# Patient Record
Sex: Male | Born: 1940 | Race: White | Hispanic: No | Marital: Married | Smoking: Former smoker
Health system: Southern US, Community
[De-identification: ages and names within clinical notes are randomized; demographics above are authoritative.]

## PROBLEM LIST (undated history)

## (undated) DIAGNOSIS — I10 Essential (primary) hypertension: Secondary | ICD-10-CM

## (undated) DIAGNOSIS — J449 Chronic obstructive pulmonary disease, unspecified: Secondary | ICD-10-CM

---

## 2018-12-09 ENCOUNTER — Inpatient Hospital Stay (HOSPITAL_COMMUNITY)
Admission: AD | Admit: 2018-12-09 | Discharge: 2019-01-06 | DRG: 163 | Disposition: E | Payer: Medicare Other | Source: Other Acute Inpatient Hospital | Attending: Pulmonary Disease | Admitting: Pulmonary Disease

## 2018-12-09 ENCOUNTER — Other Ambulatory Visit: Payer: Self-pay

## 2018-12-09 DIAGNOSIS — I952 Hypotension due to drugs: Secondary | ICD-10-CM | POA: Diagnosis not present

## 2018-12-09 DIAGNOSIS — A419 Sepsis, unspecified organism: Secondary | ICD-10-CM | POA: Diagnosis not present

## 2018-12-09 DIAGNOSIS — E872 Acidosis: Secondary | ICD-10-CM | POA: Diagnosis present

## 2018-12-09 DIAGNOSIS — E875 Hyperkalemia: Secondary | ICD-10-CM | POA: Diagnosis not present

## 2018-12-09 DIAGNOSIS — Z4659 Encounter for fitting and adjustment of other gastrointestinal appliance and device: Secondary | ICD-10-CM

## 2018-12-09 DIAGNOSIS — J9311 Primary spontaneous pneumothorax: Secondary | ICD-10-CM

## 2018-12-09 DIAGNOSIS — E870 Hyperosmolality and hypernatremia: Secondary | ICD-10-CM | POA: Diagnosis not present

## 2018-12-09 DIAGNOSIS — I471 Supraventricular tachycardia: Secondary | ICD-10-CM | POA: Diagnosis not present

## 2018-12-09 DIAGNOSIS — J15211 Pneumonia due to Methicillin susceptible Staphylococcus aureus: Secondary | ICD-10-CM | POA: Diagnosis not present

## 2018-12-09 DIAGNOSIS — J441 Chronic obstructive pulmonary disease with (acute) exacerbation: Secondary | ICD-10-CM

## 2018-12-09 DIAGNOSIS — Z8249 Family history of ischemic heart disease and other diseases of the circulatory system: Secondary | ICD-10-CM

## 2018-12-09 DIAGNOSIS — Z4682 Encounter for fitting and adjustment of non-vascular catheter: Secondary | ICD-10-CM

## 2018-12-09 DIAGNOSIS — I9589 Other hypotension: Secondary | ICD-10-CM | POA: Diagnosis not present

## 2018-12-09 DIAGNOSIS — J9622 Acute and chronic respiratory failure with hypercapnia: Secondary | ICD-10-CM | POA: Diagnosis not present

## 2018-12-09 DIAGNOSIS — E861 Hypovolemia: Secondary | ICD-10-CM | POA: Diagnosis not present

## 2018-12-09 DIAGNOSIS — Y848 Other medical procedures as the cause of abnormal reaction of the patient, or of later complication, without mention of misadventure at the time of the procedure: Secondary | ICD-10-CM | POA: Diagnosis not present

## 2018-12-09 DIAGNOSIS — Z9689 Presence of other specified functional implants: Secondary | ICD-10-CM

## 2018-12-09 DIAGNOSIS — J9601 Acute respiratory failure with hypoxia: Secondary | ICD-10-CM | POA: Diagnosis not present

## 2018-12-09 DIAGNOSIS — Z87891 Personal history of nicotine dependence: Secondary | ICD-10-CM

## 2018-12-09 DIAGNOSIS — I480 Paroxysmal atrial fibrillation: Secondary | ICD-10-CM | POA: Diagnosis present

## 2018-12-09 DIAGNOSIS — Z79899 Other long term (current) drug therapy: Secondary | ICD-10-CM | POA: Diagnosis not present

## 2018-12-09 DIAGNOSIS — J440 Chronic obstructive pulmonary disease with acute lower respiratory infection: Secondary | ICD-10-CM | POA: Diagnosis not present

## 2018-12-09 DIAGNOSIS — E876 Hypokalemia: Secondary | ICD-10-CM | POA: Diagnosis present

## 2018-12-09 DIAGNOSIS — R0602 Shortness of breath: Secondary | ICD-10-CM

## 2018-12-09 DIAGNOSIS — J189 Pneumonia, unspecified organism: Secondary | ICD-10-CM

## 2018-12-09 DIAGNOSIS — K5900 Constipation, unspecified: Secondary | ICD-10-CM | POA: Diagnosis not present

## 2018-12-09 DIAGNOSIS — J860 Pyothorax with fistula: Secondary | ICD-10-CM | POA: Diagnosis not present

## 2018-12-09 DIAGNOSIS — D72829 Elevated white blood cell count, unspecified: Secondary | ICD-10-CM | POA: Diagnosis not present

## 2018-12-09 DIAGNOSIS — D649 Anemia, unspecified: Secondary | ICD-10-CM | POA: Diagnosis present

## 2018-12-09 DIAGNOSIS — J9602 Acute respiratory failure with hypercapnia: Secondary | ICD-10-CM

## 2018-12-09 DIAGNOSIS — J9383 Other pneumothorax: Principal | ICD-10-CM | POA: Diagnosis present

## 2018-12-09 DIAGNOSIS — E440 Moderate protein-calorie malnutrition: Secondary | ICD-10-CM | POA: Diagnosis present

## 2018-12-09 DIAGNOSIS — T4275XA Adverse effect of unspecified antiepileptic and sedative-hypnotic drugs, initial encounter: Secondary | ICD-10-CM | POA: Diagnosis not present

## 2018-12-09 DIAGNOSIS — J939 Pneumothorax, unspecified: Secondary | ICD-10-CM | POA: Diagnosis not present

## 2018-12-09 DIAGNOSIS — Y95 Nosocomial condition: Secondary | ICD-10-CM | POA: Diagnosis not present

## 2018-12-09 DIAGNOSIS — N179 Acute kidney failure, unspecified: Secondary | ICD-10-CM | POA: Diagnosis not present

## 2018-12-09 DIAGNOSIS — S2249XA Multiple fractures of ribs, unspecified side, initial encounter for closed fracture: Secondary | ICD-10-CM | POA: Diagnosis not present

## 2018-12-09 DIAGNOSIS — I483 Typical atrial flutter: Secondary | ICD-10-CM | POA: Diagnosis not present

## 2018-12-09 DIAGNOSIS — R000 Tachycardia, unspecified: Secondary | ICD-10-CM | POA: Diagnosis not present

## 2018-12-09 DIAGNOSIS — R6521 Severe sepsis with septic shock: Secondary | ICD-10-CM | POA: Diagnosis not present

## 2018-12-09 DIAGNOSIS — I4891 Unspecified atrial fibrillation: Secondary | ICD-10-CM | POA: Diagnosis not present

## 2018-12-09 DIAGNOSIS — G931 Anoxic brain damage, not elsewhere classified: Secondary | ICD-10-CM | POA: Diagnosis not present

## 2018-12-09 DIAGNOSIS — J9382 Other air leak: Secondary | ICD-10-CM | POA: Diagnosis present

## 2018-12-09 DIAGNOSIS — D72828 Other elevated white blood cell count: Secondary | ICD-10-CM | POA: Diagnosis present

## 2018-12-09 DIAGNOSIS — L8990 Pressure ulcer of unspecified site, unspecified stage: Secondary | ICD-10-CM

## 2018-12-09 DIAGNOSIS — Z452 Encounter for adjustment and management of vascular access device: Secondary | ICD-10-CM

## 2018-12-09 DIAGNOSIS — R739 Hyperglycemia, unspecified: Secondary | ICD-10-CM | POA: Diagnosis not present

## 2018-12-09 DIAGNOSIS — J449 Chronic obstructive pulmonary disease, unspecified: Secondary | ICD-10-CM | POA: Diagnosis not present

## 2018-12-09 DIAGNOSIS — Z978 Presence of other specified devices: Secondary | ICD-10-CM

## 2018-12-09 DIAGNOSIS — T797XXA Traumatic subcutaneous emphysema, initial encounter: Secondary | ICD-10-CM | POA: Diagnosis present

## 2018-12-09 DIAGNOSIS — Z9289 Personal history of other medical treatment: Secondary | ICD-10-CM

## 2018-12-09 DIAGNOSIS — E8770 Fluid overload, unspecified: Secondary | ICD-10-CM | POA: Diagnosis not present

## 2018-12-09 DIAGNOSIS — J9621 Acute and chronic respiratory failure with hypoxia: Secondary | ICD-10-CM | POA: Diagnosis not present

## 2018-12-09 DIAGNOSIS — Z419 Encounter for procedure for purposes other than remedying health state, unspecified: Secondary | ICD-10-CM

## 2018-12-09 DIAGNOSIS — I10 Essential (primary) hypertension: Secondary | ICD-10-CM | POA: Diagnosis not present

## 2018-12-09 DIAGNOSIS — B952 Enterococcus as the cause of diseases classified elsewhere: Secondary | ICD-10-CM | POA: Diagnosis not present

## 2018-12-09 DIAGNOSIS — Z539 Procedure and treatment not carried out, unspecified reason: Secondary | ICD-10-CM | POA: Diagnosis not present

## 2018-12-09 DIAGNOSIS — J9690 Respiratory failure, unspecified, unspecified whether with hypoxia or hypercapnia: Secondary | ICD-10-CM

## 2018-12-09 DIAGNOSIS — B9561 Methicillin susceptible Staphylococcus aureus infection as the cause of diseases classified elsewhere: Secondary | ICD-10-CM | POA: Diagnosis present

## 2018-12-09 DIAGNOSIS — R0600 Dyspnea, unspecified: Secondary | ICD-10-CM | POA: Diagnosis not present

## 2018-12-09 DIAGNOSIS — I469 Cardiac arrest, cause unspecified: Secondary | ICD-10-CM | POA: Diagnosis not present

## 2018-12-09 DIAGNOSIS — J439 Emphysema, unspecified: Secondary | ICD-10-CM | POA: Diagnosis not present

## 2018-12-09 DIAGNOSIS — Z66 Do not resuscitate: Secondary | ICD-10-CM | POA: Diagnosis present

## 2018-12-09 DIAGNOSIS — Z01818 Encounter for other preprocedural examination: Secondary | ICD-10-CM

## 2018-12-09 DIAGNOSIS — Z515 Encounter for palliative care: Secondary | ICD-10-CM | POA: Diagnosis present

## 2018-12-09 HISTORY — DX: Chronic obstructive pulmonary disease, unspecified: J44.9

## 2018-12-09 HISTORY — DX: Essential (primary) hypertension: I10

## 2018-12-09 MED ORDER — ORAL CARE MOUTH RINSE
15.0000 mL | Freq: Two times a day (BID) | OROMUCOSAL | Status: DC
  Administered 2018-12-10 – 2018-12-18 (×12): 15 mL via OROMUCOSAL

## 2018-12-10 ENCOUNTER — Inpatient Hospital Stay (HOSPITAL_COMMUNITY): Payer: Medicare Other

## 2018-12-10 ENCOUNTER — Other Ambulatory Visit: Payer: Self-pay

## 2018-12-10 ENCOUNTER — Encounter (HOSPITAL_COMMUNITY): Payer: Self-pay | Admitting: Internal Medicine

## 2018-12-10 DIAGNOSIS — J9383 Other pneumothorax: Secondary | ICD-10-CM | POA: Diagnosis present

## 2018-12-10 DIAGNOSIS — J939 Pneumothorax, unspecified: Secondary | ICD-10-CM

## 2018-12-10 DIAGNOSIS — I10 Essential (primary) hypertension: Secondary | ICD-10-CM | POA: Diagnosis present

## 2018-12-10 DIAGNOSIS — J439 Emphysema, unspecified: Secondary | ICD-10-CM

## 2018-12-10 DIAGNOSIS — J441 Chronic obstructive pulmonary disease with (acute) exacerbation: Secondary | ICD-10-CM

## 2018-12-10 LAB — COMPREHENSIVE METABOLIC PANEL
ALBUMIN: 3.4 g/dL — AB (ref 3.5–5.0)
ALT: 29 U/L (ref 0–44)
AST: 18 U/L (ref 15–41)
Alkaline Phosphatase: 47 U/L (ref 38–126)
Anion gap: 9 (ref 5–15)
BILIRUBIN TOTAL: 0.6 mg/dL (ref 0.3–1.2)
BUN: 31 mg/dL — ABNORMAL HIGH (ref 8–23)
CO2: 31 mmol/L (ref 22–32)
Calcium: 8.7 mg/dL — ABNORMAL LOW (ref 8.9–10.3)
Chloride: 105 mmol/L (ref 98–111)
Creatinine, Ser: 1.18 mg/dL (ref 0.61–1.24)
GFR calc Af Amer: >60 mL/min (ref >60)
GFR calc non Af Amer: 60 mL/min — ABNORMAL LOW (ref >60)
Glucose, Bld: 98 mg/dL (ref 70–99)
Potassium: 3.4 mmol/L — ABNORMAL LOW (ref 3.5–5.1)
Sodium: 145 mmol/L (ref 135–145)
Total Protein: 6.1 g/dL — ABNORMAL LOW (ref 6.5–8.1)

## 2018-12-10 LAB — CBC
HCT: 38.3 % — ABNORMAL LOW (ref 39.0–52.0)
Hemoglobin: 11.8 g/dL — ABNORMAL LOW (ref 13.0–17.0)
MCH: 31.1 pg (ref 26.0–34.0)
MCHC: 30.8 g/dL (ref 30.0–36.0)
MCV: 100.8 fL — ABNORMAL HIGH (ref 80.0–100.0)
Platelets: 352 10*3/uL (ref 150–400)
RBC: 3.8 MIL/uL — ABNORMAL LOW (ref 4.22–5.81)
RDW: 11.9 % (ref 11.5–15.5)
WBC: 13.5 10*3/uL — AB (ref 4.0–10.5)
nRBC: 0 % (ref 0.0–0.2)

## 2018-12-10 LAB — MRSA PCR SCREENING: MRSA by PCR: NEGATIVE

## 2018-12-10 MED ORDER — ACETAMINOPHEN 325 MG PO TABS
650.00 | ORAL_TABLET | ORAL | Status: DC
Start: ? — End: 2018-12-10

## 2018-12-10 MED ORDER — SODIUM CHLORIDE 0.9 % IV SOLN
INTRAVENOUS | Status: DC
  Administered 2018-12-10 (×3): via INTRAVENOUS

## 2018-12-10 MED ORDER — ONDANSETRON HCL 4 MG/2ML IJ SOLN: 4.0000 mg | Freq: Four times a day (QID) | INTRAMUSCULAR | Status: DC | PRN

## 2018-12-10 MED ORDER — ONDANSETRON HCL 4 MG PO TABS: 4.0000 mg | ORAL_TABLET | Freq: Four times a day (QID) | ORAL | Status: DC | PRN

## 2018-12-10 MED ORDER — POTASSIUM CHLORIDE CRYS ER 20 MEQ PO TBCR
40.0000 meq | EXTENDED_RELEASE_TABLET | Freq: Once | ORAL | Status: AC
  Administered 2018-12-10: 40 meq via ORAL
  Filled 2018-12-10: qty 2

## 2018-12-10 MED ORDER — ALBUTEROL SULFATE (2.5 MG/3ML) 0.083% IN NEBU: 2.5000 mg | INHALATION_SOLUTION | Freq: Four times a day (QID) | RESPIRATORY_TRACT | Status: DC | PRN

## 2018-12-10 MED ORDER — OXYCODONE-ACETAMINOPHEN 5-325 MG PO TABS
1.00 | ORAL_TABLET | ORAL | Status: DC
Start: ? — End: 2018-12-10

## 2018-12-10 MED ORDER — ALBUTEROL SULFATE (2.5 MG/3ML) 0.083% IN NEBU
2.50 | INHALATION_SOLUTION | RESPIRATORY_TRACT | Status: DC
Start: ? — End: 2018-12-10

## 2018-12-10 MED ORDER — ASPIRIN EC 81 MG PO TBEC
81.00 | DELAYED_RELEASE_TABLET | ORAL | Status: DC
Start: 2018-12-10 — End: 2018-12-10

## 2018-12-10 MED ORDER — ACETAMINOPHEN 325 MG PO TABS
650.0000 mg | ORAL_TABLET | Freq: Four times a day (QID) | ORAL | Status: DC | PRN
  Administered 2018-12-10 – 2018-12-18 (×9): 650 mg via ORAL
  Filled 2018-12-10 (×9): qty 2

## 2018-12-10 MED ORDER — ONDANSETRON HCL 4 MG/2ML IJ SOLN
4.00 | INTRAMUSCULAR | Status: DC
Start: ? — End: 2018-12-10

## 2018-12-10 MED ORDER — MORPHINE SULFATE (PF) 2 MG/ML IV SOLN
2.0000 mg | INTRAVENOUS | Status: DC | PRN
  Filled 2018-12-10 (×2): qty 1

## 2018-12-10 MED ORDER — FLUTICASONE FUROATE-VILANTEROL 100-25 MCG/INH IN AEPB
1.00 | INHALATION_SPRAY | RESPIRATORY_TRACT | Status: DC
Start: 2018-12-10 — End: 2018-12-10

## 2018-12-10 MED ORDER — LOSARTAN POTASSIUM 50 MG PO TABS
100.00 | ORAL_TABLET | ORAL | Status: DC
Start: 2018-12-10 — End: 2018-12-10

## 2018-12-10 MED ORDER — OXYCODONE HCL 5 MG PO TABS: 5.0000 mg | ORAL_TABLET | Freq: Four times a day (QID) | ORAL | Status: DC | PRN

## 2018-12-10 MED ORDER — ENOXAPARIN SODIUM 40 MG/0.4ML ~~LOC~~ SOLN
40.00 | SUBCUTANEOUS | Status: DC
Start: 2018-12-10 — End: 2018-12-10

## 2018-12-10 MED ORDER — FLUTICASONE FUROATE-VILANTEROL 100-25 MCG/INH IN AEPB
1.0000 | INHALATION_SPRAY | Freq: Every day | RESPIRATORY_TRACT | Status: DC
  Administered 2018-12-10 – 2018-12-17 (×8): 1 via RESPIRATORY_TRACT
  Filled 2018-12-10 (×2): qty 28

## 2018-12-10 MED ORDER — HYDROCHLOROTHIAZIDE 25 MG PO TABS
25.00 | ORAL_TABLET | ORAL | Status: DC
Start: 2018-12-10 — End: 2018-12-10

## 2018-12-10 MED ORDER — MONTELUKAST SODIUM 10 MG PO TABS
10.00 | ORAL_TABLET | ORAL | Status: DC
Start: 2018-12-10 — End: 2018-12-10

## 2018-12-10 MED ORDER — MONTELUKAST SODIUM 10 MG PO TABS
10.0000 mg | ORAL_TABLET | Freq: Every day | ORAL | Status: DC
  Administered 2018-12-10 – 2018-12-12 (×3): 10 mg via ORAL
  Filled 2018-12-10 (×3): qty 1

## 2018-12-10 MED ORDER — TRAMADOL HCL 50 MG PO TABS
50.0000 mg | ORAL_TABLET | Freq: Four times a day (QID) | ORAL | Status: DC | PRN
  Administered 2018-12-11 – 2018-12-18 (×3): 50 mg via ORAL
  Filled 2018-12-10 (×3): qty 1

## 2018-12-10 MED ORDER — MONTELUKAST SODIUM 10 MG PO TABS: 10.0000 mg | ORAL_TABLET | Freq: Every day | ORAL | Status: DC

## 2018-12-10 NOTE — Progress Notes (Signed)
I have seen and assessed patient and agree with Dr. Boston Service assessment and plan.  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  Initially small bore thoracostomy tube was placed however course was complicated by subcutaneous emphysema and persistent air leak.  Large bore thoracostomy tube was placed with attempted chemical pleurodesis on 12/07/2018 which was complicated by significant pain, hypoxemia respiratory distress requiring transfer to ICU and required O2 delivery by OptiFlow to maintain sats.  Follow-up imaging showed persistent left pneumothorax with increasing respiratory distress.  Small bore thoracostomy tube was subsequently placed under CT guidance on 12/08/2018 with significant clinical improvement.  Patient weaned down to 2 L nasal cannula and transferred to [facility] for further evaluation by CT surgery.  Patient currently stable.  Consultation by CT surgery pending.  No charge.

## 2018-12-10 NOTE — H&P (Signed)
History and Physical    Matthew Cuevas GQB:[id number] DOB: September 23, 1941 DOA: 12/21/2018  PCP: No primary care provider on file.  Patient coming from: [facility]  I have personally briefly reviewed patient's old medical records in [facility] Link  Chief Complaint: Persistent L pneumothorax  HPI: Matthew Cuevas is a 78 y.o. male with medical history significant of COPD and HTN.  Patient was admitted to [facility] on 11/29/2018 for spontaneous L PTX secondary to emphysematous bleb rupture.  Initially controlled with small bore thoracostomy tube placement. Course complicated by subcutaneous emphysema and persistent airleak. Large bore thoracostomy tube placed with attempted chemical pleurodesis on 12/07/2018. Procedure complicated by significant pain, hypoxemia and respiratory distress.  Patient transferred to ICU service requiring O2 delivery by Optiflow to maintain saturations. Follow up imaging showing persistent left pneumothorax with increasing respiratory distress. Small bore thoracostomy tube placed under CT guidance on 12/08/2018 with significant improvement in symptoms. Patient was weaned down to 2L [place] and family has requested transfer to [facility] for continued management.  He arrives to [facility] in stable condition, satting 100% on 2L, RR 20, BP 142/42, thankfully not requiring pressors (he had been requiring levophed earlier in the day prior to transfer apparently).  He and wife report improvement in the swelling around his face (subcutaneous emphysema).  He reports he isnt in any respiratory distress right now, though he will get short of breath if he tries to stand up and walk to the bathroom he says.   Review of Systems: As per HPI otherwise 10 point review of systems negative.   Past Medical History:  Diagnosis Date  . COPD (chronic obstructive pulmonary disease) (HCC)   . HTN (hypertension)     History reviewed. No pertinent surgical history.   reports that he has quit smoking. He  does not have any smokeless tobacco history on file. He reports that he does not drink alcohol or use drugs.  Allergies  Allergen Reactions  . Doxycycline     Caused respiratory distress when they tried to use it in pleural space for pneumothorax  . Penicillins     History reviewed. No pertinent family history.   Prior to Admission medications   Not on File    Physical Exam: Vitals:   12/24/2018 2300 12/23/2018 2313  BP:  (!) 142/42  Resp:  (!) 27  Temp: 98.3 F (36.8 C)   TempSrc: Oral   SpO2:  98%  Height: 5\' 10"  (1.778 m)     Constitutional: NAD, calm, comfortable Eyes: PERRL, lids and conjunctivae normal ENMT: Mucous membranes are moist. Posterior pharynx clear of any exudate or lesions.Normal dentition.  Neck: normal, supple, no masses, no thyromegaly Respiratory: clear to auscultation bilaterally, no wheezing, no crackles. Normal respiratory effort. No accessory muscle use.  Cardiovascular: Regular rate and rhythm, no murmurs / rubs / gallops. No extremity edema. 2+ pedal pulses. No carotid bruits.  Abdomen: no tenderness, no masses palpated. No hepatosplenomegaly. Bowel sounds positive.  Musculoskeletal: no clubbing / cyanosis. No joint deformity upper and lower extremities. Good ROM, no contractures. Normal muscle tone.  Skin: SubQ emphysema up to face Neurologic: CN 2-12 grossly intact. Sensation intact, DTR normal. Strength 5/5 in all 4.  Psychiatric: Normal judgment and insight. Alert and oriented x 3. Normal mood.    Labs on Admission: I have personally reviewed following labs and imaging studies  CBC: No results for input(s): WBC, NEUTROABS, HGB, HCT, MCV, PLT in the last 168 hours. Basic Metabolic Panel: No  results for input(s): NA, K, CL, CO2, GLUCOSE, BUN, CREATININE, CALCIUM, MG, PHOS in the last 168 hours. GFR: CrCl cannot be calculated (No successful lab value found.). Liver Function Tests: No results for input(s): AST, ALT, ALKPHOS, BILITOT, PROT,  ALBUMIN in the last 168 hours. No results for input(s): LIPASE, AMYLASE in the last 168 hours. No results for input(s): AMMONIA in the last 168 hours. Coagulation Profile: No results for input(s): INR, PROTIME in the last 168 hours. Cardiac Enzymes: No results for input(s): CKTOTAL, CKMB, CKMBINDEX, TROPONINI in the last 168 hours. BNP (last 3 results) No results for input(s): PROBNP in the last 8760 hours. HbA1C: No results for input(s): HGBA1C in the last 72 hours. CBG: No results for input(s): GLUCAP in the last 168 hours. Lipid Profile: No results for input(s): CHOL, HDL, LDLCALC, TRIG, CHOLHDL, LDLDIRECT in the last 72 hours. Thyroid Function Tests: No results for input(s): TSH, T4TOTAL, FREET4, T3FREE, THYROIDAB in the last 72 hours. Anemia Panel: No results for input(s): VITAMINB12, FOLATE, FERRITIN, TIBC, IRON, RETICCTPCT in the last 72 hours. Urine analysis: No results found for: COLORURINE, APPEARANCEUR, LABSPEC, PHURINE, GLUCOSEU, HGBUR, BILIRUBINUR, KETONESUR, PROTEINUR, UROBILINOGEN, NITRITE, LEUKOCYTESUR  Radiological Exams on Admission: No results found.  EKG: Independently reviewed.  Assessment/Plan Principal Problem:   Spontaneous pneumothorax Active Problems:   COPD (chronic obstructive pulmonary disease) (HCC)   HTN (hypertension)    1. Spontaneous L PTX - persistent air leak and PTX on X rays at OSH 1. Dr. Dorris Fetch will see patient in AM 2. Will keep patient NPO for now.  Ask Dr. Dorris Fetch tomorrow around breakfast time if he has any surgery planned 1. IVF NS at 100 cc/hr 3. Will get routine CBC/CMP 4. Will get port CXR 5. 2 chest tubes in place, one pulling air still. 6. Currently stable 7. Tele monitor 8. Cont pulse ox 9. Patient requested tylenol for pain control, says he didn't want anything stronger (I did offer morphine). 2. COPD - 1. Cont breo elipita daily 2. Cont singulair 3. Cont albuterol PRN 3. HTN - 1. Holding home BP meds as he  had just been on levophed earlier today.  DVT prophylaxis: SCDs - in case he needs surgery Code Status: Full Family Communication: Wife at bedside Disposition Plan: Home after admit Consults called: Dr. Dorris Fetch will see patient in AM Admission status: Admit to inpatient  Severity of Illness: The appropriate patient status for this patient is INPATIENT. Inpatient status is judged to be reasonable and necessary in order to provide the required intensity of service to ensure the patient's safety. [name] presenting symptoms, physical exam findings, and initial radiographic and laboratory data in the context of their chronic comorbidities is felt to place them at high risk for further clinical deterioration. Furthermore, it is not anticipated that the patient will be medically stable for discharge from the hospital within 2 midnights of admission. The following factors support the patient status of inpatient.   Patient transferred from [facility] with persistent pneumothorax on X rays and air leak despite 2 chest tubes and a week of treatment there as described above.   * I certify that at the point of admission it is my clinical judgment that the patient will require inpatient hospital care spanning beyond 2 midnights from the point of admission due to high intensity of service, high risk for further deterioration and high frequency of surveillance required.*    ,  M. DO [facility]  How to contact the [facility] Attending or Consulting  provider 7A - 7P or covering provider during after hours 7P -7A, for this patient?  1. Check the care team in [facility] and look for a) attending/consulting [facility] provider listed and b) the [facility] team listed 2. Log into [email or web address]  Amion Physician Scheduling and messaging for groups and whole hospitals  On call and physician scheduling software for group practices, residents, hospitalists and other medical providers for call, clinic, rotation and shift  schedules. OnCall [employer] is a hospital-wide system for scheduling doctors and paging doctors on call. EasyPlot is for scientific plotting and data analysis.  [email or web address]  and use [place]'s universal password to access. If you do not have the password, please contact the hospital operator.  3. Locate the [facility] provider you are looking for under [facility] and page to a number that you can be directly reached. 4. If you still have difficulty reaching the provider, please page the [facility] (Director on Call) for the Hospitalists listed on amion for assistance.  12/10/2018, 12:53 AM

## 2018-12-10 NOTE — Consult Note (Addendum)
[address]       [place] [address]             [phone]        DRAYCEN WEISHAUPT Rolla Medical Record #[id number] Date of Birth: 04-21-1941  Referring: No ref. provider found Primary Care: No primary care provider on file. Primary Cardiologist:No primary care provider on file.  Chief Complaint: shortness of breath  History of Present Illness:      Mr. Arne Cleveland is a 78 year old male patient with a past medical history significant for COPD, previous tobacco abuse (quit 4 years ago) and hypertension, who originally was admitted on 11/29/2018 for a left spontaneous pneumothorax secondary to an emphysematous bleb rupture.  A small bore ( 16 french) chest tube was placed emergently. He then had severe subcutaneous emphysema and a persistent air leak.  A large bore chest tube was placed on 12/07/2018 and a chemical pleurodesis was attempted through the tube.  The patient then had significant pain, hypoxemia, and respiratory distress.  The patient was transferred to the ICU and initially was requiring high flow [place] to maintain saturation levels. He was not intubated at this time.  Follow-up chest x-ray showed persistent left pneumothorax, therefore small bore chest tube was placed under CT guidance on 12/08/2018 with significant improvement in symptoms.  Once weaned to 2 L nasal cannula oxygen support his family requested transfer to [facility] for continued care.    On arrival to [facility] he remained stable and did not require any IV pressors.  A chest x-ray was obtained on 12/10/2018 which showed a 5% left apical pneumothorax with 2 left chest tubes in place.  There is subcutaneous emphysema in the bilateral supraclavicular regions and over the left chest wall.  The patient is currently tolerating 4 L nasal cannula with good oxygen saturation.  He endorses shortness of breath when walking short distances. The patient is very weak and has not done much walking on his  own.There is an air leak present in the pigtail catheter atrium but not the large bore chest tube atrium. We are consulted for possible surgical intervention.   Current Activity/ Functional Status: Patient was independent with mobility/ambulation, transfers, ADL's, IADL's.   Zubrod Score: At the time of surgery this patient's most appropriate activity status/level should be described as: []     0    Normal activity, no symptoms [x]     1    Restricted in physical strenuous activity but ambulatory, able to do out light work []     2    Ambulatory and capable of self care, unable to do work activities, up and about                 more than 50%  Of the time                            []     3    Only limited self care, in bed greater than 50% of waking hours []     4    Completely disabled, no self care, confined to bed or chair []     5    Moribund  Past Medical History:  Diagnosis Date  . COPD (chronic obstructive pulmonary disease) (HCC)   . HTN (hypertension)     History reviewed. No pertinent surgical history.  Social History   Tobacco Use  Smoking Status Former Smoker  Social History   Substance and Sexual Activity  Alcohol Use Never  . Frequency: Never     Allergies  Allergen Reactions  . Doxycycline Shortness Of Breath and Other (See Comments)    Caused respiratory distress when they tried to use it in pleural space for pneumothorax  . Penicillins     Did it involve swelling of the face/tongue/throat, SOB, or low BP? Unknown Did it involve sudden or severe rash/hives, skin peeling, or any reaction on the inside of your mouth or nose? Unknown Did you need to seek medical attention at a hospital or doctor's office? Unknown When did it last happen? [place] If all above answers are "NO", may proceed with cephalosporin use.     Current Facility-Administered Medications  Medication Dose Route Frequency Provider Last Rate Last Dose  . 0.9 %  sodium chloride  infusion   Intravenous Continuous Hillary Bow, DO 100 mL/hr at 12/10/18 0058    . acetaminophen (TYLENOL) tablet 650 mg  650 mg Oral Q6H PRN Hillary Bow, DO   650 mg at 12/10/18 0051  . albuterol (PROVENTIL) (2.5 MG/3ML) 0.083% nebulizer solution 2.5 mg  2.5 mg Nebulization Q6H PRN Hillary Bow, DO      . fluticasone furoate-vilanterol (BREO ELLIPTA) 100-25 MCG/INH 1 puff  1 puff Inhalation Daily Lyda Perone M, DO   1 puff at 12/10/18 0756  . MEDLINE mouth rinse  15 mL Mouth Rinse BID Dorcas Carrow, MD   15 mL at 12/10/18 0904  . [START ON 12/11/2018] montelukast (SINGULAIR) tablet 10 mg  10 mg Oral QHS Hillary Bow, DO      . morphine 2 MG/ML injection 2-4 mg  2-4 mg Intravenous Q4H PRN Hillary Bow, DO      . ondansetron [facility]) tablet 4 mg  4 mg Oral Q6H PRN Hillary Bow, DO       Or  . ondansetron [facility]) injection 4 mg  4 mg Intravenous Q6H PRN Hillary Bow, DO      . traMADol Janean Sark) tablet 50 mg  50 mg Oral Q6H PRN Rodolph Bong, MD        Medications Prior to Admission  Medication Sig Dispense Refill Last Dose  . BREO ELLIPTA 100-25 MCG/INH AEPB Inhale 1 puff into the lungs 2 (two) times daily.     . hydrochlorothiazide (HYDRODIURIL) 25 MG tablet Take 25 mg by mouth daily.     Marland Kitchen losartan (COZAAR) 100 MG tablet Take 100 mg by mouth daily.     . montelukast (SINGULAIR) 10 MG tablet Take 10 mg by mouth at bedtime.       History reviewed. No pertinent family history.   Review of Systems:   Review of Systems  Respiratory: Positive for shortness of breath.   Cardiovascular: Negative for chest pain and leg swelling.  Gastrointestinal: Negative for heartburn, nausea and vomiting.   Pertinent items are noted in HPI.     Physical Exam: BP (!) 117/48 (BP Location: Left Arm)   Pulse 76   Temp 97.8 F (36.6 C) (Oral)   Resp (!) 23   Ht 5\' 10"  (1.778 m)   Wt 63.5 kg   SpO2 99%   BMI 20.09 kg/m    General appearance: alert, cooperative  and no distress Resp: clear to auscultation bilaterally Cardio: regular rate and rhythm, S1, S2 normal, no murmur, click, rub or gallop GI: soft, non-tender; bowel sounds normal; no masses,  no organomegaly Extremities: extremities normal,  atraumatic, no cyanosis or edema Neurologic: Grossly normal  Diagnostic Studies & Laboratory data:     Recent Radiology Findings:  CLINICAL DATA:  Pneumothorax  EXAM: PORTABLE CHEST 1 VIEW  COMPARISON:  None.  FINDINGS: Pigtail chest tube projects over the left lung base. Surgical chest tube projects over the left upper lung zone. 5% left apical pneumothorax persists. Subcutaneous emphysema is in the bilateral supraclavicular regions and over the left chest wall. Right lung is clear. Scattered volume loss in the left lung. Upper normal heart size.  IMPRESSION: There are 2 left chest tubes and a less than 5% left apical pneumothorax.   Electronically Signed   By: Jolaine Click M.D.   On: 12/10/2018 10:14   I have independently reviewed the above radiologic studies and discussed with the patient   Recent Lab Findings: Lab Results  Component Value Date   WBC 13.5 (H) 12/10/2018   HGB 11.8 (L) 12/10/2018   HCT 38.3 (L) 12/10/2018   PLT 352 12/10/2018   GLUCOSE 98 12/10/2018   ALT 29 12/10/2018   AST 18 12/10/2018   NA 145 12/10/2018   K 3.4 (L) 12/10/2018   CL 105 12/10/2018   CREATININE 1.18 12/10/2018   BUN 31 (H) 12/10/2018   CO2 31 12/10/2018      Assessment / Plan:      1. Spontaneous pneumothorax-Two chest tubes in place, one pigtail catheter with an air leak and one large bore chest tube without an air leak. Patient is on 4L [place] with good oxygen saturation.   Plan: Explained the procedure of video-assistance thoracoscopic surgery with possible blebectomy +/- pleurodesis. Patient is very concerned about post-op pain so I explained about the Exparel block/local anesthetic that we usually use as well as a post-op PCA  and oral medications. All questions were answered to the patient's and family's satisfaction.     I  spent 40 minutes counseling the patient face to face.   Jari Favre, PA-C 12/10/2018 9:46 AM   Patient seen and examined. 78 yo with history of tobacco abuse, COPD, and hypertension. Admitted to [place] about 10 days ago with a spontaneous pneumothorax. Ct placed. Developed SQ emphysema, enlarging pneumothorax. 2nd tube placed. Attempted pleurodesis, but developed severe pain, hypotension. Large recurrent pneumo and finally another pigtail placed. CT done for pigtail placement showed a large pneumothorax, unable to tell anything about underlying anatomy. Currently CXR shows lung is reexpanded (possibly a small residual apical pneumo). Has a significant air leak from pigtail, none from chest tube. On CT chest tube was probably in the fissure. Less likely parenchymal.  We need another CT to assess the left lung and determine if this is potentially treatable surgically or other option might be better such as IBV.  Salvatore Decent Dorris Fetch, MD [facility] [phone]

## 2018-12-10 NOTE — H&P (View-Only) (Signed)
[address]       [place] [address]             [phone]        DRAYCEN WEISHAUPT [facility] Medical Record #[id number] Date of Birth: 04-21-1941  Referring: No ref. provider found Primary Care: No primary care provider on file. Primary Cardiologist:No primary care provider on file.  Chief Complaint: shortness of breath  History of Present Illness:      Matthew Cuevas is a 78 year old male patient with a past medical history significant for COPD, previous tobacco abuse (quit 4 years ago) and hypertension, who originally was admitted on 11/29/2018 for a left spontaneous pneumothorax secondary to an emphysematous bleb rupture.  A small bore ( 16 french) chest tube was placed emergently. He then had severe subcutaneous emphysema and a persistent air leak.  A large bore chest tube was placed on 12/07/2018 and a chemical pleurodesis was attempted through the tube.  The patient then had significant pain, hypoxemia, and respiratory distress.  The patient was transferred to the ICU and initially was requiring high flow [place] to maintain saturation levels. He was not intubated at this time.  Follow-up chest x-ray showed persistent left pneumothorax, therefore small bore chest tube was placed under CT guidance on 12/08/2018 with significant improvement in symptoms.  Once weaned to 2 L nasal cannula oxygen support his family requested transfer to [facility] for continued care.    On arrival to [facility] he remained stable and did not require any IV pressors.  A chest x-ray was obtained on 12/10/2018 which showed a 5% left apical pneumothorax with 2 left chest tubes in place.  There is subcutaneous emphysema in the bilateral supraclavicular regions and over the left chest wall.  The patient is currently tolerating 4 L nasal cannula with good oxygen saturation.  He endorses shortness of breath when walking short distances. The patient is very weak and has not done much walking on his  own.There is an air leak present in the pigtail catheter atrium but not the large bore chest tube atrium. We are consulted for possible surgical intervention.   Current Activity/ Functional Status: Patient was independent with mobility/ambulation, transfers, ADL's, IADL's.   Zubrod Score: At the time of surgery this patient's most appropriate activity status/level should be described as: []     0    Normal activity, no symptoms [x]     1    Restricted in physical strenuous activity but ambulatory, able to do out light work []     2    Ambulatory and capable of self care, unable to do work activities, up and about                 more than 50%  Of the time                            []     3    Only limited self care, in bed greater than 50% of waking hours []     4    Completely disabled, no self care, confined to bed or chair []     5    Moribund  Past Medical History:  Diagnosis Date  . COPD (chronic obstructive pulmonary disease) (HCC)   . HTN (hypertension)     History reviewed. No pertinent surgical history.  Social History   Tobacco Use  Smoking Status Former Smoker  Social History   Substance and Sexual Activity  Alcohol Use Never  . Frequency: Never     Allergies  Allergen Reactions  . Doxycycline Shortness Of Breath and Other (See Comments)    Caused respiratory distress when they tried to use it in pleural space for pneumothorax  . Penicillins     Did it involve swelling of the face/tongue/throat, SOB, or low BP? Unknown Did it involve sudden or severe rash/hives, skin peeling, or any reaction on the inside of your mouth or nose? Unknown Did you need to seek medical attention at a hospital or doctor's office? Unknown When did it last happen? [place] If all above answers are "NO", may proceed with cephalosporin use.     Current Facility-Administered Medications  Medication Dose Route Frequency Provider Last Rate Last Dose  . 0.9 %  sodium chloride  infusion   Intravenous Continuous Hillary Bow, DO 100 mL/hr at 12/10/18 0058    . acetaminophen (TYLENOL) tablet 650 mg  650 mg Oral Q6H PRN Hillary Bow, DO   650 mg at 12/10/18 0051  . albuterol (PROVENTIL) (2.5 MG/3ML) 0.083% nebulizer solution 2.5 mg  2.5 mg Nebulization Q6H PRN Hillary Bow, DO      . fluticasone furoate-vilanterol (BREO ELLIPTA) 100-25 MCG/INH 1 puff  1 puff Inhalation Daily Lyda Perone M, DO   1 puff at 12/10/18 0756  . MEDLINE mouth rinse  15 mL Mouth Rinse BID Dorcas Carrow, MD   15 mL at 12/10/18 0904  . [START ON 12/11/2018] montelukast (SINGULAIR) tablet 10 mg  10 mg Oral QHS Hillary Bow, DO      . morphine 2 MG/ML injection 2-4 mg  2-4 mg Intravenous Q4H PRN Hillary Bow, DO      . ondansetron [facility]) tablet 4 mg  4 mg Oral Q6H PRN Hillary Bow, DO       Or  . ondansetron [facility]) injection 4 mg  4 mg Intravenous Q6H PRN Hillary Bow, DO      . traMADol Janean Sark) tablet 50 mg  50 mg Oral Q6H PRN Rodolph Bong, MD        Medications Prior to Admission  Medication Sig Dispense Refill Last Dose  . BREO ELLIPTA 100-25 MCG/INH AEPB Inhale 1 puff into the lungs 2 (two) times daily.     . hydrochlorothiazide (HYDRODIURIL) 25 MG tablet Take 25 mg by mouth daily.     Marland Kitchen losartan (COZAAR) 100 MG tablet Take 100 mg by mouth daily.     . montelukast (SINGULAIR) 10 MG tablet Take 10 mg by mouth at bedtime.       History reviewed. No pertinent family history.   Review of Systems:   Review of Systems  Respiratory: Positive for shortness of breath.   Cardiovascular: Negative for chest pain and leg swelling.  Gastrointestinal: Negative for heartburn, nausea and vomiting.   Pertinent items are noted in HPI.     Physical Exam: BP (!) 117/48 (BP Location: Left Arm)   Pulse 76   Temp 97.8 F (36.6 C) (Oral)   Resp (!) 23   Ht 5\' 10"  (1.778 m)   Wt 63.5 kg   SpO2 99%   BMI 20.09 kg/m    General appearance: alert, cooperative  and no distress Resp: clear to auscultation bilaterally Cardio: regular rate and rhythm, S1, S2 normal, no murmur, click, rub or gallop GI: soft, non-tender; bowel sounds normal; no masses,  no organomegaly Extremities: extremities normal,  atraumatic, no cyanosis or edema Neurologic: Grossly normal  Diagnostic Studies & Laboratory data:     Recent Radiology Findings:  CLINICAL DATA:  Pneumothorax  EXAM: PORTABLE CHEST 1 VIEW  COMPARISON:  None.  FINDINGS: Pigtail chest tube projects over the left lung base. Surgical chest tube projects over the left upper lung zone. 5% left apical pneumothorax persists. Subcutaneous emphysema is in the bilateral supraclavicular regions and over the left chest wall. Right lung is clear. Scattered volume loss in the left lung. Upper normal heart size.  IMPRESSION: There are 2 left chest tubes and a less than 5% left apical pneumothorax.   Electronically Signed   By: Arthur  Hoss M.D.   On: 12/10/2018 10:14   I have independently reviewed the above radiologic studies and discussed with the patient   Recent Lab Findings: Lab Results  Component Value Date   WBC 13.5 (H) 12/10/2018   HGB 11.8 (L) 12/10/2018   HCT 38.3 (L) 12/10/2018   PLT 352 12/10/2018   GLUCOSE 98 12/10/2018   ALT 29 12/10/2018   AST 18 12/10/2018   NA 145 12/10/2018   K 3.4 (L) 12/10/2018   CL 105 12/10/2018   CREATININE 1.18 12/10/2018   BUN 31 (H) 12/10/2018   CO2 31 12/10/2018      Assessment / Plan:      1. Spontaneous pneumothorax-Two chest tubes in place, one pigtail catheter with an air leak and one large bore chest tube without an air leak. Patient is on 4L [place] with good oxygen saturation.   Plan: Explained the procedure of video-assistance thoracoscopic surgery with possible blebectomy +/- pleurodesis. Patient is very concerned about post-op pain so I explained about the Exparel block/local anesthetic that we usually use as well as a post-op PCA  and oral medications. All questions were answered to the patient's and family's satisfaction.     I  spent 40 minutes counseling the patient face to face.   Tessa Conte, PA-C 12/10/2018 9:46 AM   Patient seen and examined. 77 yo with history of tobacco abuse, COPD, and hypertension. Admitted [place] about 10 days ago with a spontaneous pneumothorax. Ct placed. Developed SQ emphysema, enlarging pneumothorax. 2nd tube placed. Attempted pleurodesis, but developed severe pain, hypotension. Large recurrent pneumo and finally another pigtail placed. CT done for pigtail placement showed a large pneumothorax, unable to tell anything about underlying anatomy. Currently CXR shows lung is reexpanded (possibly a small residual apical pneumo). Has a significant air leak from pigtail, none from chest tube. On CT chest tube was probably in the fissure. Less likely parenchymal.  We need another CT to assess the left lung and determine if this is potentially treatable surgically or other option might be better such as IBV.  Hamsini Verrilli C. Zackary Mckeone, MD [facility] [phone]    

## 2018-12-10 NOTE — Progress Notes (Signed)
RN paged Cardiovascular Surgery office and was given [facility] PA pager number. RN paged PA. PA stated she would page MD for RN to clarify if pt's pigtail can be off suction for transport and/or CT scan. PA called RN back to report that she attempted to page MD 2 times but no answer yet. PA gave RN MD's pager number.   When MD replied he stated pt can be off suction for transport but with RN going down to CT with pt. MD stated pt must be on suction for scan.  RN called [facility]AT to see if they were available to help bring pt to scan. She was not.   RN will report need for scan to night shift RN.

## 2018-12-11 ENCOUNTER — Inpatient Hospital Stay (HOSPITAL_COMMUNITY): Payer: Medicare Other

## 2018-12-11 ENCOUNTER — Encounter (HOSPITAL_COMMUNITY): Payer: Self-pay

## 2018-12-11 DIAGNOSIS — E870 Hyperosmolality and hypernatremia: Secondary | ICD-10-CM

## 2018-12-11 DIAGNOSIS — J939 Pneumothorax, unspecified: Secondary | ICD-10-CM | POA: Diagnosis present

## 2018-12-11 DIAGNOSIS — T797XXA Traumatic subcutaneous emphysema, initial encounter: Secondary | ICD-10-CM

## 2018-12-11 DIAGNOSIS — D72829 Elevated white blood cell count, unspecified: Secondary | ICD-10-CM

## 2018-12-11 LAB — URINALYSIS, ROUTINE W REFLEX MICROSCOPIC
Bilirubin Urine: NEGATIVE
Glucose, UA: NEGATIVE mg/dL
Hgb urine dipstick: NEGATIVE
Ketones, ur: 20 mg/dL — AB
LEUKOCYTE UA: NEGATIVE
Nitrite: NEGATIVE
Protein, ur: NEGATIVE mg/dL
Specific Gravity, Urine: 1.015 (ref 1.005–1.030)
pH: 5 (ref 5.0–8.0)

## 2018-12-11 LAB — CBC
HCT: 36.5 % — ABNORMAL LOW (ref 39.0–52.0)
Hemoglobin: 11.2 g/dL — ABNORMAL LOW (ref 13.0–17.0)
MCH: 31.2 pg (ref 26.0–34.0)
MCHC: 30.7 g/dL (ref 30.0–36.0)
MCV: 101.7 fL — ABNORMAL HIGH (ref 80.0–100.0)
NRBC: 0 % (ref 0.0–0.2)
Platelets: 328 10*3/uL (ref 150–400)
RBC: 3.59 MIL/uL — AB (ref 4.22–5.81)
RDW: 11.9 % (ref 11.5–15.5)
WBC: 14.3 10*3/uL — ABNORMAL HIGH (ref 4.0–10.5)

## 2018-12-11 LAB — BASIC METABOLIC PANEL
ANION GAP: 5 (ref 5–15)
BUN: 22 mg/dL (ref 8–23)
CHLORIDE: 110 mmol/L (ref 98–111)
CO2: 31 mmol/L (ref 22–32)
Calcium: 8.4 mg/dL — ABNORMAL LOW (ref 8.9–10.3)
Creatinine, Ser: 1.12 mg/dL (ref 0.61–1.24)
GFR calc Af Amer: >60 mL/min (ref >60)
GFR calc non Af Amer: >60 mL/min (ref >60)
Glucose, Bld: 97 mg/dL (ref 70–99)
POTASSIUM: 3.7 mmol/L (ref 3.5–5.1)
Sodium: 146 mmol/L — ABNORMAL HIGH (ref 135–145)

## 2018-12-11 MED ORDER — LIDOCAINE HCL (PF) 1 % IJ SOLN
10.0000 mL | Freq: Once | INTRAMUSCULAR | Status: AC
  Administered 2018-12-11: 10 mL via INTRADERMAL

## 2018-12-11 MED ORDER — FLUTICASONE FUROATE-VILANTEROL 100-25 MCG/INH IN AEPB: 1.0000 | INHALATION_SPRAY | Freq: Every day | RESPIRATORY_TRACT | Status: DC

## 2018-12-11 MED ORDER — LIDOCAINE HCL (PF) 1 % IJ SOLN
INTRAMUSCULAR | Status: AC
  Filled 2018-12-11: qty 5

## 2018-12-11 MED ORDER — MONTELUKAST SODIUM 10 MG PO TABS: 10.0000 mg | ORAL_TABLET | Freq: Every day | ORAL | Status: DC

## 2018-12-11 MED ORDER — ASPIRIN EC 81 MG PO TBEC
81.0000 mg | DELAYED_RELEASE_TABLET | Freq: Every day | ORAL | Status: DC
  Administered 2018-12-12 – 2018-12-16 (×5): 81 mg via ORAL
  Filled 2018-12-11 (×6): qty 1

## 2018-12-11 MED ORDER — SODIUM CHLORIDE 0.45 % IV SOLN
INTRAVENOUS | Status: DC
  Administered 2018-12-11 – 2018-12-13 (×4): via INTRAVENOUS

## 2018-12-11 NOTE — Progress Notes (Signed)
PROGRESS NOTE    Matthew Cuevas  YQM:[id number] DOB: 1940/11/14 DOA: 12/19/2018 PCP: System, Pcp Not In    Brief Narrative:  HPI per Dr. Julian Reil HPI: Matthew Cuevas is a 78 y.o. male with medical history significant of COPD and HTN.  Patient was admitted to [facility] on 11/29/2018 for spontaneous L PTX secondary to emphysematous bleb rupture.  Initially controlled with small bore thoracostomy tube placement. Course complicated by subcutaneous emphysema and persistent airleak. Large bore thoracostomy tube placed with attempted chemical pleurodesis on 12/07/2018. Procedure complicated by significant pain, hypoxemia and respiratory distress.  Patient transferred to ICU service requiring O2 delivery by Optiflow to maintain saturations. Follow up imaging showing persistent left pneumothorax with increasing respiratory distress. Small bore thoracostomy tube placed under CT guidance on 12/08/2018 with significant improvement in symptoms. Patient was weaned down to 2L [place] and family has requested transfer to [facility] for continued management.  He arrives to [facility] in stable condition, satting 100% on 2L, RR 20, BP 142/42, thankfully not requiring pressors (he had been requiring levophed earlier in the day prior to transfer apparently).  He and wife report improvement in the swelling around his face (subcutaneous emphysema).  He reports he isnt in any respiratory distress right now, though he will get short of breath if he tries to stand up and walk to the bathroom he says.   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  Patient with some shortness of breath.  CT chest done last night with large anterior pneumothorax.  Patient with decreased breath sounds on the left.  Patient seen in consultation by CT surgery and patient for pigtail catheter today.  Management per cardiothoracic surgery.  2.  COPD Stable.  Resume home regimen of Brio Ellipta.  Continue Singulair.  Nebs as needed.  3.  Leukocytosis Likely reactive leukocytosis secondary to problem #1.  Check a UA with cultures and sensitivities.  CT chest negative for any acute infiltrate.  Check blood cultures x2.  No need for antibiotics at this time.  Will follow.  4.  Hypernatremia Change IV fluids to half-normal saline and follow.   DVT prophylaxis: SCDs Code Status: Full Family Communication: Updated patient and family at bedside. Disposition Plan: Likely home when clinically improved and when okay with CT surgery.     Consultants:   Cardiothoracic surgery: Dr. Dorris Fetch 12/10/2018  Procedures:   CT chest 12/10/2018  Chest x-ray 12/10/2018  Antimicrobials:   None   Subjective: In bed.  Some complaints of shortness of breath.  Denies any chest pain.  No abdominal pain.  Objective: Vitals:   12/10/18 1700 12/10/18 2300 12/11/18 0733 12/11/18 0759  BP: 125/62 (!) 124/57  (!) 120/51  Pulse: 86 87  82  Resp: 20 18  17   Temp: 98.1 F (36.7 C) 98.2 F (36.8 C)  97.6 F (36.4 C)  TempSrc: Oral Oral  Oral  SpO2: 100% 99% 100% 99%  Weight:      Height:        Intake/Output Summary (Last 24 hours) at 12/11/2018 1053 Last data filed at 12/11/2018 0600  Gross per 24 hour  Intake -  Output 825 ml  Net -825 ml   Filed Weights   12/26/2018 2300  Weight: 63.5 kg    Examination:  General exam: Appears calm and comfortable  Respiratory system: Decreased breath sounds on the left.  Minimal expiratory wheezing.  No rhonchi.  No crackles.  Left anterior subcutaneous emphysema.   respiratory effort normal.  Chest tube in place. Cardiovascular system: S1 & S2 heard, RRR. No JVD, murmurs, rubs, gallops or clicks. No pedal edema. Gastrointestinal system: Abdomen is nondistended, soft and nontender. No organomegaly or masses felt. Normal bowel sounds heard. Central nervous system: Alert and oriented. No focal neurological deficits. Extremities: Symmetric 5 x 5 power. Skin: No rashes, lesions or ulcers Psychiatry: Judgement and insight appear normal. Mood & affect appropriate.     Data Reviewed: I have personally reviewed following labs and imaging studies  CBC: Recent Labs  Lab 12/10/18 0205 12/11/18 0238  WBC 13.5* 14.3*  HGB 11.8* 11.2*  HCT 38.3* 36.5*  MCV 100.8* 101.7*  PLT 352 328   Basic Metabolic Panel: Recent Labs  Lab 12/10/18 0205 12/11/18 0238  NA 145 146*  K 3.4* 3.7  CL 105 110  CO2 31 31  GLUCOSE 98 97  BUN 31* 22  CREATININE 1.18 1.12  CALCIUM 8.7* 8.4*   GFR: Estimated Creatinine Clearance: 50.4 mL/min (by C-G formula based on SCr of 1.12 mg/dL). Liver Function Tests: Recent Labs  Lab 12/10/18 0205  AST 18  ALT 29  ALKPHOS 47  BILITOT 0.6  PROT 6.1*  ALBUMIN 3.4*   No results for input(s): LIPASE, AMYLASE in the last 168 hours. No results for input(s): AMMONIA in the last 168 hours. Coagulation Profile: No results for input(s): INR, PROTIME in the last 168 hours. Cardiac Enzymes: No results for input(s): CKTOTAL, CKMB, CKMBINDEX, TROPONINI in the last 168 hours. BNP (last 3 results) No results for input(s): PROBNP in the last 8760 hours. HbA1C: No results for input(s): HGBA1C in the last 72 hours. CBG: No results for input(s): GLUCAP in the last 168 hours. Lipid Profile: No results for input(s): CHOL, HDL, LDLCALC, TRIG, CHOLHDL, LDLDIRECT in the last 72 hours. Thyroid Function Tests: No results for input(s): TSH, T4TOTAL, FREET4, T3FREE, THYROIDAB in the last 72 hours. Anemia Panel: No results for input(s):  VITAMINB12, FOLATE, FERRITIN, TIBC, IRON, RETICCTPCT in the last 72 hours. Sepsis Labs: No results for input(s): PROCALCITON, LATICACIDVEN in the last 168 hours.  Recent Results (from the past 240 hour(s))  MRSA PCR Screening     Status: None   Collection Time: 12/10/2018 11:26 PM  Result Value Ref Range Status   MRSA by PCR NEGATIVE NEGATIVE Final    Comment:        The GeneXpert MRSA Assay (FDA approved for NASAL specimens only), is one component of a comprehensive MRSA colonization surveillance program. It is not intended to diagnose MRSA infection nor to guide or monitor treatment for MRSA infections. Performed at [name], [address] [place], [place] [address]          Radiology Studies: Ct Chest Wo Contrast  Addendum Date: 12/11/2018   ADDENDUM REPORT: 12/11/2018 00:29 ADDENDUM: These results were called by telephone at the time of interpretation on 12/11/2018 at 12:28 am to Nurse Reuel Boom , who verbally acknowledged these results. Electronically Signed   By: Tollie Eth M.D.   On: 12/11/2018 00:29   Result Date: 12/11/2018 CLINICAL DATA:  Acute respiratory illness  with pneumothorax. Patient is on suction. EXAM: CT CHEST WITHOUT CONTRAST TECHNIQUE: Multidetector CT imaging of the chest was performed following the standard protocol without IV contrast. COMPARISON:  CXR 12/10/2018 at 0703 hours. FINDINGS: Cardiovascular: Conventional branch pattern of the great vessels with atherosclerotic origins. Aortic atherosclerosis is noted with ectasia of the ascending thoracic aorta to 3.3 cm in diameter. The unenhanced pulmonary arteries are unremarkable. There is coronary arteriosclerosis more notably along the left main, LAD and RCA. The heart size is normal. There is trace anterior pericardial effusion. Mediastinum/Nodes: Trace pneumomediastinum likely extension from the soft tissue emphysema along the chest wall and base of neck. No adenopathy. Midline patent trachea without  deviation. Patent bilateral mainstem bronchi. Lungs/Pleura: Sizable left upper anterior pneumothorax not appreciated on the frontal view of the chest measuring at least 7.6 cm in thickness AP, series 3/60. Collapse of the left upper lobe is identified enveloping the left-sided chest tube which is seen coursing along the left major fissure. Repositioning of the chest is recommended more anteriorly within the left upper thorax. Otherwise, there is extensive centrilobular and paraseptal emphysema bilaterally. Trace right pleural effusion. Smaller pigtail catheter is noted at the left base overlying costophrenic angle. Upper Abdomen: Gallstones are identified of the partially included gallbladder. 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 The unenhanced liver, kidneys and adrenal glands are nonacute. Musculoskeletal: Extensive soft tissue emphysema is noted about the left hemithorax extending across midline into the right upper thorax, both shoulders and base of neck. Extension into the superior mediastinum is noted albeit minimal. IMPRESSION: 1. There is a large anterior left upper pneumothorax measuring up to 7.6 cm in thickness and repositioning of the patient's left-sided chest tube is recommended more so anteriorly. Partial left upper lobe collapse and atelectasis is noted. 2. Extensive subcutaneous soft tissue emphysema along the left lateral chest wall, about base of neck and both shoulders extending into the superior mediastinum. 3. No mediastinal shift. 4. 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 5. Uncomplicated cholelithiasis. 6. 9 mm splenic artery aneurysm, partially calcified. Aortic Atherosclerosis (ICD10-I70.0). Electronically Signed: By: Tollie Eth M.D. On: 12/11/2018 00:05   Portable Chest 1 View  Result Date: 12/10/2018 CLINICAL DATA:  Pneumothorax EXAM: PORTABLE CHEST 1 VIEW COMPARISON:  None. FINDINGS: Pigtail chest tube projects over the left lung  base. Surgical chest tube projects over the left upper lung zone. 5% left apical pneumothorax persists. Subcutaneous emphysema is in the bilateral supraclavicular regions and over the left chest wall. Right lung is clear. Scattered volume loss in the left lung. Upper normal heart size. IMPRESSION: There are 2 left chest tubes and a less than 5% left apical pneumothorax. Electronically Signed   By: Jolaine Click M.D.   On: 12/10/2018 10:14        Scheduled Meds: . fluticasone furoate-vilanterol  1 puff Inhalation Daily  . mouth rinse  15 mL Mouth Rinse BID  . montelukast  10 mg Oral QHS   Continuous Infusions: . sodium chloride 100 mL/hr at 12/11/18 0912     LOS: 2 days    Time spent: 35 mins    Ramiro Harvest, MD [facility]  If 7PM-7AM, please contact night-coverage [email or web address] 12/11/2018, 10:53 AM

## 2018-12-11 NOTE — Care Management Note (Signed)
Case Management Note  Patient Details  Name: JULIYAN WOOSLEY MRN: [id number] Date of Birth: 03/12/1941  Subjective/Objective:  From home with spouse, spontaneous ptx, has chest tubes x2  in to suction. CVTS consulted, another pigtail chest tube placed today, has positive air leak .    DME- none PCP- Dr. Jenita Seashore at [facility] Transport- wife Meds- no problem getting Pharmacy- [facility] [address]                  Action/Plan: NCM will follow for transition of care needs.  Expected Discharge Date:                  Expected Discharge Plan:  Home w Home Health Services  In-House Referral:     Discharge planning Services  CM Consult  Post Acute Care Choice:    Choice offered to:     DME Arranged:    DME Agency:     HH Arranged:    HH Agency:     Status of Service:  In process, will continue to follow  If discussed at Long Length of Stay Meetings, dates discussed:    Additional Comments:  Leone Haven, RN 12/11/2018, 4:14 PM

## 2018-12-11 NOTE — Progress Notes (Signed)
Procedure(s) (LRB): VIDEO BRONCHOSCOPY (N/A) possible VIDEO BRONCHOSCOPY WITH INSERTION OF INTERBRONCHIAL VALVE (IBV) (N/A) possible CHEST TUBE INSERTION (Left) Subjective: C/o still feeling short of breath  Objective: Vital signs in last 24 hours: Temp:  [97.6 F (36.4 C)-98.2 F (36.8 C)] 98.2 F (36.8 C) (03/06 1122) Pulse Rate:  [82-87] 87 (03/06 1122) Cardiac Rhythm: Normal sinus rhythm (03/06 0701) Resp:  [17-20] 19 (03/06 1122) BP: (120-125)/(48-62) 124/48 (03/06 1122) SpO2:  [99 %-100 %] 99 % (03/06 1122)  Hemodynamic parameters for last 24 hours:    Intake/Output from previous day: 03/05 0701 - 03/06 0700 In: -  Out: 825 [Urine:775; Chest Tube:50] Intake/Output this shift: No intake/output data recorded.  General appearance: alert, cooperative and no distress Neurologic: intact Heart: regular rate and rhythm Lungs: diminished breath sounds left anterior SQ emphysema unchanged  Lab Results: Recent Labs    12/10/18 0205 12/11/18 0238  WBC 13.5* 14.3*  HGB 11.8* 11.2*  HCT 38.3* 36.5*  PLT 352 328   BMET:  Recent Labs    12/10/18 0205 12/11/18 0238  NA 145 146*  K 3.4* 3.7  CL 105 110  CO2 31 31  GLUCOSE 98 97  BUN 31* 22  CREATININE 1.18 1.12  CALCIUM 8.7* 8.4*    PT/INR: No results for input(s): LABPROT, INR in the last 72 hours. ABG No results found for: PHART, HCO3, TCO2, ACIDBASEDEF, O2SAT CBG (last 3)  No results for input(s): GLUCAP in the last 72 hours.  Assessment/Plan: S/P Procedure(s) (LRB): VIDEO BRONCHOSCOPY (N/A) possible VIDEO BRONCHOSCOPY WITH INSERTION OF INTERBRONCHIAL VALVE (IBV) (N/A) possible CHEST TUBE INSERTION (Left) -Spontaneous pneumothorax I personally reviewed the CT images. He has a large anterior pneumothorax. Pigtail is in position posteriorly and lower lobe is expanded, but upper lobe is nearly completely atelectatic.  Unfortunately he ate at about 10 AM so eliminates ability to do anything in OR today. I  think the best option is to place a pigtail anteriorly and remove the CT which is in the fissure. If air leak persists will plan to go to OR on Monday for bronch and IBV placement  LOS: 2 days    Matthew Cuevas 12/11/2018

## 2018-12-11 NOTE — Progress Notes (Signed)
      [address]       [name] [address]             [phone]      Informed consent obtained for chest tube placement Time out performed. Correct patient, site and procedure confirmed Sterile technique Local with 1% lidocaine 14 F pigtail catheter placed left anterior chest using modified Seldinger technique + air leak after placement Tolerated well  Viviann Spare C. Dorris Fetch, MD [facility] [phone]   Left lateral chest tube (nonfunctional) removed without difficulty  Viviann Spare C. Dorris Fetch, MD [facility] [phone]

## 2018-12-11 NOTE — Progress Notes (Signed)
Notified by Radiologist results of pt's CT scan. Radiologist recommended that chest tube needed to readjusted. Notified NP Bodenheimer and was told to notify Cardiothoracic physician on-call. Paged Dr. Laneta Simmers and notified about patient's CT scan results and radiologist's recommendations. Will continue to monitor pt.

## 2018-12-11 NOTE — Procedures (Signed)
  Informed consent obtained for chest tube placement Time out performed. Correct patient, site and procedure confirmed Sterile technique Local with 1% lidocaine 14 F pigtail catheter placed left anterior chest using modified Seldinger technique + air leak after placement Tolerated well  Viviann Spare C. Dorris Fetch, MD [facility] [phone]   Left lateral chest tube (nonfunctional) removed without difficulty  Viviann Spare C. Dorris Fetch, MD [facility] [phone]

## 2018-12-12 ENCOUNTER — Inpatient Hospital Stay (HOSPITAL_COMMUNITY): Payer: Medicare Other

## 2018-12-12 LAB — BASIC METABOLIC PANEL
Anion gap: 6 (ref 5–15)
BUN: 17 mg/dL (ref 8–23)
CO2: 30 mmol/L (ref 22–32)
Calcium: 8.4 mg/dL — ABNORMAL LOW (ref 8.9–10.3)
Chloride: 106 mmol/L (ref 98–111)
Creatinine, Ser: 1 mg/dL (ref 0.61–1.24)
GFR calc Af Amer: >60 mL/min (ref >60)
GFR calc non Af Amer: >60 mL/min (ref >60)
Glucose, Bld: 132 mg/dL — ABNORMAL HIGH (ref 70–99)
Potassium: 3.5 mmol/L (ref 3.5–5.1)
Sodium: 142 mmol/L (ref 135–145)

## 2018-12-12 LAB — CBC WITH DIFFERENTIAL/PLATELET
Abs Immature Granulocytes: 0.05 10*3/uL (ref 0.00–0.07)
Basophils Absolute: 0.1 10*3/uL (ref 0.0–0.1)
Basophils Relative: 1 %
Eosinophils Absolute: 0.9 10*3/uL — ABNORMAL HIGH (ref 0.0–0.5)
Eosinophils Relative: 7 %
HEMATOCRIT: 38 % — AB (ref 39.0–52.0)
Hemoglobin: 12 g/dL — ABNORMAL LOW (ref 13.0–17.0)
Immature Granulocytes: 0 %
Lymphocytes Relative: 11 %
Lymphs Abs: 1.3 10*3/uL (ref 0.7–4.0)
MCH: 31.9 pg (ref 26.0–34.0)
MCHC: 31.6 g/dL (ref 30.0–36.0)
MCV: 101.1 fL — ABNORMAL HIGH (ref 80.0–100.0)
Monocytes Absolute: 0.9 10*3/uL (ref 0.1–1.0)
Monocytes Relative: 7 %
Neutro Abs: 9 10*3/uL — ABNORMAL HIGH (ref 1.7–7.7)
Neutrophils Relative %: 74 %
Platelets: 336 10*3/uL (ref 150–400)
RBC: 3.76 MIL/uL — ABNORMAL LOW (ref 4.22–5.81)
RDW: 11.9 % (ref 11.5–15.5)
WBC: 12.2 10*3/uL — ABNORMAL HIGH (ref 4.0–10.5)
nRBC: 0 % (ref 0.0–0.2)

## 2018-12-12 NOTE — Progress Notes (Addendum)
Procedure(s) (LRB): VIDEO BRONCHOSCOPY (N/A) possible VIDEO BRONCHOSCOPY WITH INSERTION OF INTERBRONCHIAL VALVE (IBV) (N/A) possible CHEST TUBE INSERTION (Left) Subjective: Resting in bed with his wife at the bedside.  No new problems.  Denies pain.  No increasing shortness of breath.  Objective: Vital signs in last 24 hours: Temp:  [97.7 F (36.5 C)-98.4 F (36.9 C)] 98.4 F (36.9 C) (03/07 0904) Pulse Rate:  [64-74] 74 (03/07 0904) Cardiac Rhythm: Normal sinus rhythm (03/07 0700) Resp:  [18-19] 19 (03/07 0904) BP: (103-118)/(42-52) 103/42 (03/07 0904) SpO2:  [100 %] 100 % (03/07 0904)    Intake/Output from previous day: 03/06 0701 - 03/07 0700 In: 1758 [I.V.:1758] Out: 670 [Urine:400; Chest Tube:270] Intake/Output this shift: Total I/O In: -  Out: 400 [Urine:150; Chest Tube:250]  General appearance: alert, cooperative and no distress Heart: regularly irregular rhythm Lungs: Breath sounds are clear, diminished on the left. Post pleural pigtail catheters are secured.  Insertion sites are dry.  The anterior tube has a large active air leak.  He has some mild subcu emphysema on his anterior and lateral left chest wall.  This is not unexpected given the volume of his air leak.  Lab Results: Recent Labs    12/11/18 0238 12/12/18 0838  WBC 14.3* 12.2*  HGB 11.2* 12.0*  HCT 36.5* 38.0*  PLT 328 336   BMET:  Recent Labs    12/11/18 0238 12/12/18 0838  NA 146* 142  K 3.7 3.5  CL 110 106  CO2 31 30  GLUCOSE 97 132*  BUN 22 17  CREATININE 1.12 1.00  CALCIUM 8.4* 8.4*    PT/INR: No results for input(s): LABPROT, INR in the last 72 hours. ABG No results found for: PHART, HCO3, TCO2, ACIDBASEDEF, O2SAT CBG (last 3)  No results for input(s): GLUCAP in the last 72 hours.  Assessment/Plan: S/P Procedure(s) (LRB): VIDEO BRONCHOSCOPY (N/A) possible VIDEO BRONCHOSCOPY WITH INSERTION OF INTERBRONCHIAL VALVE (IBV) (N/A) possible CHEST TUBE INSERTION  (Left)  -Left-sided spontaneous pneumothorax in a 78 year old male with extensive emphysematous disease.  His left lung is fully expanded after placement of a second pigtail catheter anteriorly.  There is an active air leak that is fairly large.  We will leave both chest tubes on suction although I am not convinced the posterior tube is functional at this stage.  No change in management.  Follow-up chest x-ray tomorrow morning.   LOS: 3 days    Leary Roca, PA-C [phone] 12/12/2018   I have seen and examined the patient and agree with the assessment and plan as outlined.  Purcell Nails, MD 12/12/2018 6:00 PM

## 2018-12-12 NOTE — Progress Notes (Signed)
F/U with ct d/t medical informing that this nurse would have received information by 12pm. Per cardiothoracic surgery-No further concerns, nor additional interventions needed by nursing staff at this time.

## 2018-12-12 NOTE — Progress Notes (Signed)
PROGRESS NOTE    Matthew Cuevas  UPJ:[id number] DOB: [birth year] DOA: 12/28/2018 PCP: System, Pcp Not In    Brief Narrative:  HPI per Dr. Julian Reil HPI: Matthew Cuevas is a 78 y.o. male with medical history significant of COPD and HTN.  Patient was admitted to [facility] on 11/29/2018 for spontaneous L PTX secondary to emphysematous bleb rupture.  Initially controlled with small bore thoracostomy tube placement. Course complicated by subcutaneous emphysema and persistent airleak. Large bore thoracostomy tube placed with attempted chemical pleurodesis on 12/07/2018. Procedure complicated by significant pain, hypoxemia and respiratory distress.  Patient transferred to ICU service requiring O2 delivery by [employer] to maintain saturations. Follow up imaging showing persistent left pneumothorax with increasing respiratory distress. Small bore thoracostomy tube placed under CT guidance on 12/08/2018 with significant improvement in symptoms. Patient was weaned down to 2L [place] and family has requested transfer to [facility] for continued management.  He arrives to [facility] in stable condition, satting 100% on 2L, RR 20, BP 142/42, thankfully not requiring pressors (he had been requiring levophed earlier in the day prior to transfer apparently).  He and wife report improvement in the swelling around his face (subcutaneous emphysema).  He reports he isnt in any respiratory distress right now, though he will get short of breath if he tries to stand up and walk to the bathroom he says.   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  Patient with some improvement with shortness of breath.  CT chest done the evening of 12/10/2018, with large anterior pneumothorax.  Patient status post pigtail catheter placement per CT surgery 12/11/2018 with improvement with breath sounds on the left and shortness of breath.  Repeat chest x-ray this morning with 2 chest tubes on the left with minimal pneumothorax.  No significant recurrence noted.  Emphysematous changes stable from previous exam.  Management per cardiothoracic surgery.  2.  COPD Stable.  Continue home regimen of Brio Ellipta, Singulair.  Nebs as needed.  3.  Leukocytosis Likely reactive leukocytosis secondary to problem #1.  Urinalysis unremarkable.  CT chest negative for any acute infiltrate.  Blood cultures pending.  Patient afebrile.  Leukocytosis trending down.  No need for antibiotics at this time.  Follow.    4.  Hypernatremia Improved with change of IV fluids.  Follow.     DVT prophylaxis: SCDs Code Status: Full Family Communication: Updated patient and wife at bedside. Disposition Plan: Likely home when clinically improved and when okay with CT surgery.     Consultants:   Cardiothoracic surgery: Dr. Dorris Fetch 12/10/2018  Procedures:   CT chest 12/10/2018  Chest x-ray 12/10/2018, 12/12/2018  Insertion of 14 French pigtail catheter per Dr. Dorris Fetch, CT surgery 12/11/2018  Antimicrobials:   None   Subjective: Patient sitting up in bed.  States some improvement with shortness of breath since admit but not at baseline.  Patient denies any abdominal pain.  No nausea or vomiting.  Some left-sided intermittent discomfort around chest tube sites.    Objective: Vitals:   12/11/18 2300 12/12/18 0300 12/12/18 0838 12/12/18 0904  BP: (!) 108/50 Marland Kitchen)  118/52  (!) 103/42  Pulse: 65 69  74  Resp: Temp: 97.7 F (36.5 C) 97.7 F (36.5 C)  98.4 F (36.9 C)  TempSrc: Oral Oral  Oral  SpO2: 100%  100% 100% 100%  Weight:      Height:        Intake/Output Summary (Last 24 hours) at 12/12/2018 1002 Last data filed at 12/12/2018 0700 Gross per 24 hour  Intake 1758.01 ml  Output 670 ml  Net 1088.01 ml   Filed Weights   01/02/2019 2300  Weight: 63.5 kg    Examination:  General exam: NAD Respiratory system: Improved air movement on the left.  No wheezing noted.  No rhonchi.  No crackles.  Crepitus noted left lower lateral wall consistent with subcutaneous emphysema.  Speaking in full sentences.  Chest tubes in place.  Cardiovascular system: Regular rate rhythm no murmurs rubs or gallops.  No JVD.  No lower extremity edema. Gastrointestinal system: Abdomen is soft, nontender, nondistended, positive bowel sounds.  No rebound.  No guarding.  Central nervous system: Alert and oriented. No focal neurological deficits. Extremities: Symmetric 5 x 5 power. Skin: No rashes, lesions or ulcers Psychiatry: Judgement and insight appear normal. Mood & affect appropriate.     Data Reviewed: I have personally reviewed following labs and imaging studies  CBC: Recent Labs  Lab 12/10/18 0205 12/11/18 0238 12/12/18 0838  WBC 13.5* 14.3* 12.2*  NEUTROABS  --   --  9.0*  HGB 11.8* 11.2* 12.0*  HCT 38.3* 36.5* 38.0*  MCV 100.8* 101.7* 101.1*  PLT 352 328 336   Basic Metabolic Panel: Recent Labs  Lab 12/10/18 0205 12/11/18 0238 12/12/18 0838  NA 145 146* 142  K 3.4* 3.7 3.5  CL 105 110 106  CO2 GLUCOSE 98 97 132*  BUN 31* 22 17  CREATININE 1.18 1.12 1.00  CALCIUM 8.7* 8.4* 8.4*   GFR: Estimated Creatinine Clearance: 56.4 mL/min (by C-G formula based on SCr of 1 mg/dL). Liver Function Tests: Recent Labs  Lab 12/10/18 0205  AST 18  ALT 29  ALKPHOS 47  BILITOT 0.6  PROT 6.1*  ALBUMIN 3.4*   No results for input(s): LIPASE, AMYLASE in the last 168 hours. No results for input(s): AMMONIA in the last 168 hours. Coagulation Profile: No results for input(s): INR,  PROTIME in the last 168 hours. Cardiac Enzymes: No results for input(s): CKTOTAL, CKMB, CKMBINDEX, TROPONINI in the last 168 hours. BNP (last 3 results) No results for input(s): PROBNP in the last 8760 hours. HbA1C: No results for input(s): HGBA1C in the last 72 hours. CBG: No results for input(s): GLUCAP in the last 168 hours. Lipid Profile: No results for input(s): CHOL, HDL, LDLCALC, TRIG, CHOLHDL, LDLDIRECT in the last 72 hours. Thyroid Function Tests: No results for input(s): TSH, T4TOTAL, FREET4, T3FREE, THYROIDAB in the last 72 hours. Anemia Panel: No results for input(s): VITAMINB12, FOLATE, FERRITIN, TIBC, IRON, RETICCTPCT in the last 72 hours. Sepsis Labs: No results for input(s): PROCALCITON, LATICACIDVEN in the last 168 hours.  Recent Results (from the past 240 hour(s))  MRSA PCR Screening     Status: None   Collection Time: 12/30/2018 11:26 PM  Result Value Ref Range Status   MRSA by PCR NEGATIVE NEGATIVE Final    Comment:        The GeneXpert MRSA Assay (FDA approved for NASAL specimens only), is one component of a comprehensive MRSA colonization surveillance program. It is  not intended to diagnose MRSA infection nor to guide or monitor treatment for MRSA infections. Performed at [facility], [address] [place], [place] [address]   Urine Culture     Status: None (Preliminary result)   Collection Time: 12/11/18  8:04 AM  Result Value Ref Range Status   Specimen Description URINE, RANDOM  Final   Special Requests NONE  Final   Culture   Final    CULTURE REINCUBATED FOR BETTER GROWTH Performed at [facility], [address] [place], [place] [address]    Report Status PENDING  Incomplete  Culture, blood (Routine X 2) w Reflex to ID Panel     Status: None (Preliminary result)   Collection Time: 12/11/18  8:55 AM  Result Value Ref Range Status   Specimen Description BLOOD RIGHT ANTECUBITAL  Final   Special Requests   Final    BOTTLES DRAWN  AEROBIC AND ANAEROBIC Blood Culture adequate volume   Culture   Final    NO GROWTH < 24 HOURS Performed at [facility], [address], [place], [place] [address]    Report Status PENDING  Incomplete  Culture, blood (Routine X 2) w Reflex to ID Panel     Status: None (Preliminary result)   Collection Time: 12/11/18  8:59 AM  Result Value Ref Range Status   Specimen Description BLOOD LEFT HAND  Final   Special Requests   Final    BOTTLES DRAWN AEROBIC AND ANAEROBIC Blood Culture adequate volume   Culture   Final    NO GROWTH < 24 HOURS Performed at [facility], [address] [place], [place] [address]    Report Status PENDING  Incomplete         Radiology Studies: Ct Chest Wo Contrast  Addendum Date: 12/11/2018   ADDENDUM REPORT: 12/11/2018 00:29 ADDENDUM: These results were called by telephone at the time of interpretation on 12/11/2018 at 12:28 am to Nurse Reuel Boom , who verbally acknowledged these results. Electronically Signed   By: Tollie Eth M.D.   On: 12/11/2018 00:29   Result Date: 12/11/2018 CLINICAL DATA:  Acute respiratory illness with pneumothorax. Patient is on suction. EXAM: CT CHEST WITHOUT CONTRAST TECHNIQUE: Multidetector CT imaging of the chest was performed following the standard protocol without IV contrast. COMPARISON:  CXR 12/10/2018 at 0703 hours. FINDINGS: Cardiovascular: Conventional branch pattern of the great vessels with atherosclerotic origins. Aortic atherosclerosis is noted with ectasia of the ascending thoracic aorta to 3.3 cm in diameter. The unenhanced pulmonary arteries are unremarkable. There is coronary arteriosclerosis more notably along the left main, LAD and RCA. The heart size is normal. There is trace anterior pericardial effusion. Mediastinum/Nodes: Trace pneumomediastinum likely extension from the soft tissue emphysema along the chest wall and base of neck. No adenopathy. Midline patent trachea without deviation. Patent bilateral  mainstem bronchi. Lungs/Pleura: Sizable left upper anterior pneumothorax not appreciated on the frontal view of the chest measuring at least 7.6 cm in thickness AP, series 3/60. Collapse of the left upper lobe is identified enveloping the left-sided chest tube which is seen coursing along the left major fissure. Repositioning of the chest is recommended more anteriorly within the left upper thorax. Otherwise, there is extensive centrilobular and paraseptal emphysema bilaterally. Trace right pleural effusion. Smaller pigtail catheter is noted at the left base overlying costophrenic angle. Upper Abdomen: Gallstones are identified of the partially included gallbladder. 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 The unenhanced liver, kidneys and adrenal glands are nonacute. Musculoskeletal: Extensive soft tissue emphysema is noted about the left hemithorax extending across midline into the right upper thorax, both shoulders and base of neck. Extension into the superior mediastinum is noted albeit minimal. IMPRESSION: 1. There is a large anterior left upper pneumothorax measuring up to 7.6 cm in thickness and repositioning of the patient's left-sided chest tube is recommended more so anteriorly. Partial left upper lobe collapse and atelectasis is noted. 2. Extensive subcutaneous soft tissue emphysema along the left lateral chest wall, about base of neck and both shoulders extending into the superior mediastinum. 3. No mediastinal shift. 4. 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 5. Uncomplicated cholelithiasis. 6. 9 mm splenic artery aneurysm, partially calcified. Aortic Atherosclerosis (ICD10-I70.0). Electronically Signed: By: Tollie Eth M.D. On: 12/11/2018 00:05   Dg Chest Port 1 View  Result Date: 12/12/2018 CLINICAL DATA:  Follow-up pneumothorax EXAM: PORTABLE CHEST 1 VIEW COMPARISON:  12/11/2018 FINDINGS: Two drainage catheters are identified and stable. Minimal  residual pneumothorax is seen on the left. Subcutaneous emphysema is seen. Diffuse emphysematous changes are noted. No focal infiltrate or effusion is seen. IMPRESSION: Two chest tubes on the left with minimal pneumothorax. No significant recurrence is noted. Emphysematous changes stable from the previous exam. Electronically Signed   By: Alcide Clever M.D.   On: 12/12/2018 08:42   Dg Chest Port 1 View  Result Date: 12/11/2018 CLINICAL DATA:  Follow-up left pneumothorax. EXAM: PORTABLE CHEST 1 VIEW COMPARISON:  Yesterday. FINDINGS: A pigtail catheter at the left lateral lung base is unchanged. Interval 2nd, short-segment pigtail catheter with its tip at the left lung apex, replacing the previously demonstrated larger caliber chest tube. No pneumothorax is seen at the spine. No significant change in subcutaneous emphysema. Minimal patchy opacity at the left lateral lung base. Otherwise, clear lungs. Normal sized heart. Diffuse osteopenia. IMPRESSION: 1. No pneumothorax. 2. Minimal patchy atelectasis or pneumonia at the left lateral lung base. Electronically Signed   By: Beckie Salts M.D.   On: 12/11/2018 14:51        Scheduled Meds: . aspirin EC  81 mg Oral Daily  . fluticasone furoate-vilanterol  1 puff Inhalation Daily  . mouth rinse  15 mL Mouth Rinse BID  . montelukast  10 mg Oral QHS   Continuous Infusions: . sodium chloride 100 mL/hr at 12/12/18 0604     LOS: 3 days    Time spent: 35 mins    Ramiro Harvest, MD [facility]  If 7PM-7AM, please contact night-coverage [email or web address] 12/12/2018, 10:02 AM

## 2018-12-12 NOTE — Progress Notes (Signed)
Dr. Janee Morn on the unit to evaluate pt. States that possible subcutaneous emphysema. Dr. Andrey Cota that he has contacted medical staff for further evaluation and f/u.

## 2018-12-12 NOTE — Progress Notes (Signed)
Upon listening  to pt luq chest and side noted abnormal sounds-ome rubbing and clicking in the general area. Requested charge nurse Josh to listen to confirm findings-same noted. Communication sent to medical for a request to  evaluate.

## 2018-12-13 ENCOUNTER — Inpatient Hospital Stay (HOSPITAL_COMMUNITY): Payer: Medicare Other

## 2018-12-13 LAB — URINE CULTURE: Culture: 30000 — AB

## 2018-12-13 LAB — BASIC METABOLIC PANEL
Anion gap: 7 (ref 5–15)
BUN: 15 mg/dL (ref 8–23)
CO2: 28 mmol/L (ref 22–32)
Calcium: 8 mg/dL — ABNORMAL LOW (ref 8.9–10.3)
Chloride: 105 mmol/L (ref 98–111)
Creatinine, Ser: 0.85 mg/dL (ref 0.61–1.24)
GFR calc non Af Amer: >60 mL/min (ref >60)
Glucose, Bld: 95 mg/dL (ref 70–99)
Potassium: 3.4 mmol/L — ABNORMAL LOW (ref 3.5–5.1)
SODIUM: 140 mmol/L (ref 135–145)

## 2018-12-13 LAB — CBC
HCT: 34.1 % — ABNORMAL LOW (ref 39.0–52.0)
Hemoglobin: 10.6 g/dL — ABNORMAL LOW (ref 13.0–17.0)
MCH: 31 pg (ref 26.0–34.0)
MCHC: 31.1 g/dL (ref 30.0–36.0)
MCV: 99.7 fL (ref 80.0–100.0)
Platelets: 332 10*3/uL (ref 150–400)
RBC: 3.42 MIL/uL — ABNORMAL LOW (ref 4.22–5.81)
RDW: 11.7 % (ref 11.5–15.5)
WBC: 12 10*3/uL — AB (ref 4.0–10.5)
nRBC: 0 % (ref 0.0–0.2)

## 2018-12-13 MED ORDER — LORATADINE 10 MG PO TABS
10.0000 mg | ORAL_TABLET | Freq: Every day | ORAL | Status: DC
  Administered 2018-12-13 – 2018-12-17 (×5): 10 mg via ORAL
  Filled 2018-12-13 (×5): qty 1

## 2018-12-13 MED ORDER — LORATADINE 10 MG PO TABS
10.0000 mg | ORAL_TABLET | Freq: Every day | ORAL | Status: DC
  Filled 2018-12-13: qty 1

## 2018-12-13 MED ORDER — POTASSIUM CHLORIDE CRYS ER 20 MEQ PO TBCR
40.0000 meq | EXTENDED_RELEASE_TABLET | Freq: Once | ORAL | Status: AC
  Administered 2018-12-13: 40 meq via ORAL
  Filled 2018-12-13: qty 2

## 2018-12-13 NOTE — Progress Notes (Addendum)
PROGRESS NOTE    Matthew Cuevas  TDS:[id number] DOB: 15-Apr-1941 DOA: 12/28/2018 PCP: System, Pcp Not In    Brief Narrative:  HPI per Dr. Julian Reil HPI: Matthew Cuevas is a 78 y.o. male with medical history significant of COPD and HTN.  Patient was admitted to [facility] on 11/29/2018 for spontaneous L PTX secondary to emphysematous bleb rupture.  Initially controlled with small bore thoracostomy tube placement. Course complicated by subcutaneous emphysema and persistent airleak. Large bore thoracostomy tube placed with attempted chemical pleurodesis on 12/07/2018. Procedure complicated by significant pain, hypoxemia and respiratory distress.  Patient transferred to ICU service requiring O2 delivery by [employer] to maintain saturations. Follow up imaging showing persistent left pneumothorax with increasing respiratory distress. Small bore thoracostomy tube placed under CT guidance on 12/08/2018 with significant improvement in symptoms. Patient was weaned down to 2L [place] and family has requested transfer to [facility] for continued management.  He arrives to [facility] in stable condition, satting 100% on 2L, RR 20, BP 142/42, thankfully not requiring pressors (he had been requiring levophed earlier in the day prior to transfer apparently).  He and wife report improvement in the swelling around his face (subcutaneous emphysema).  He reports he isnt in any respiratory distress right now, though he will get short of breath if he tries to stand up and walk to the bathroom he says.   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  Patient with some improvement with shortness of breath.  CT chest done the evening of 12/10/2018, with large anterior pneumothorax.  Patient status post pigtail catheter placement per CT surgery 12/11/2018 with improvement with breath sounds on the left and shortness of breath.  Repeat chest x-ray this morning pending. Emphysematous changes stable from previous exam.  Management per cardiothoracic surgery.  2.  COPD Stable.  Continue home regimen of Brio Ellipta, Nebs as needed.  Discontinue Singulair and place on Claritin as patient with complaints of still increasing congestion.  3.  Leukocytosis Likely reactive leukocytosis secondary to problem #1.  Urinalysis unremarkable.  CT chest negative for any acute infiltrate.  Blood cultures pending with no growth to date..  Patient afebrile.  Leukocytosis slowly trending down.  No need for antibiotics at this time.  Follow.    4.  Hypernatremia Improved with change of IV fluids.  Follow.  5.  Hypokalemia Replete.   DVT prophylaxis: SCDs Code Status: Full Family Communication: Updated patient and wife at bedside. Disposition Plan: Likely home when clinically improved and when okay with CT surgery.     Consultants:   Cardiothoracic surgery: Dr. Dorris Fetch 12/10/2018  Procedures:   CT chest 12/10/2018  Chest x-ray 12/10/2018, 12/12/2018, 12/13/2018  Insertion of 14 French pigtail catheter per Dr. Dorris Fetch, CT surgery 12/11/2018  Antimicrobials:   None   Subjective: Patient sitting up in bed.  States shortness of breath is improved.  No chest pain.  Patient complaining of congestion.  No nausea or vomiting.  Wondering whether any more procedures will be done and hesitant to eat.   Objective: Vitals:   12/12/18 1911 12/12/18 2300 12/13/18 2620  12/13/18 0830  BP: (!) 114/50 (!) 109/52 (!) 118/50 (!) 125/45  Pulse: 79 63 78 95  Resp: 16 16 20 18   Temp: 98.4 F (36.9 C) 97.9 F  (36.6 C) 97.9 F (36.6 C) 97.7 F (36.5 C)  TempSrc: Oral Oral Oral Oral  SpO2: 100% 99% 100% 94%  Weight:      Height:        Intake/Output Summary (Last 24 hours) at 12/13/2018 1038 Last data filed at 12/13/2018 0803 Gross per 24 hour  Intake 2227.14 ml  Output 1195 ml  Net 1032.14 ml   Filed Weights   12/16/2018 2300  Weight: 63.5 kg    Examination:  General exam: NAD Respiratory system: Fair air movement.  No wheezing.  No crackles.  No rhonchi.  Decreasing crepitus and lateral left chest wall consistent with subcutaneous emphysema.  Chest tubes in place.  Cardiovascular system: RRR no murmurs rubs or gallops.  No JVD.  No lower extremity edema.  Gastrointestinal system: Abdomen is nontender, nondistended, soft, positive bowel sounds.  No rebound.  No guarding. Central nervous system: Alert and oriented. No focal neurological deficits. Extremities: Symmetric 5 x 5 power. Skin: No rashes, lesions or ulcers Psychiatry: Judgement and insight appear normal. Mood & affect appropriate.     Data Reviewed: I have personally reviewed following labs and imaging studies  CBC: Recent Labs  Lab 12/10/18 0205 12/11/18 0238 12/12/18 0838 12/13/18 0412  WBC 13.5* 14.3* 12.2* 12.0*  NEUTROABS  --   --  9.0*  --   HGB 11.8* 11.2* 12.0* 10.6*  HCT 38.3* 36.5* 38.0* 34.1*  MCV 100.8* 101.7* 101.1* 99.7  PLT 352 328 336 332   Basic Metabolic Panel: Recent Labs  Lab 12/10/18 0205 12/11/18 0238 12/12/18 0838 12/13/18 0412  NA 145 146* 142 140  K 3.4* 3.7 3.5 3.4*  CL 105 110 106 105  CO2 31 31 30 28   GLUCOSE 98 97 132* 95  BUN 31* 22 17 15   CREATININE 1.18 1.12 1.00 0.85  CALCIUM 8.7* 8.4* 8.4* 8.0*   GFR: Estimated Creatinine Clearance: 66.4 mL/min (by C-G formula based on SCr of 0.85 mg/dL). Liver Function Tests: Recent Labs  Lab 12/10/18 0205  AST 18  ALT 29  ALKPHOS 47  BILITOT 0.6  PROT 6.1*  ALBUMIN 3.4*   No results for input(s): LIPASE, AMYLASE in the  last 168 hours. No results for input(s): AMMONIA in the last 168 hours. Coagulation Profile: No results for input(s): INR, PROTIME in the last 168 hours. Cardiac Enzymes: No results for input(s): CKTOTAL, CKMB, CKMBINDEX, TROPONINI in the last 168 hours. BNP (last 3 results) No results for input(s): PROBNP in the last 8760 hours. HbA1C: No results for input(s): HGBA1C in the last 72 hours. CBG: No results for input(s): GLUCAP in the last 168 hours. Lipid Profile: No results for input(s): CHOL, HDL, LDLCALC, TRIG, CHOLHDL, LDLDIRECT in the last 72 hours. Thyroid Function Tests: No results for input(s): TSH, T4TOTAL, FREET4, T3FREE, THYROIDAB in the last 72 hours. Anemia Panel: No results for input(s): VITAMINB12, FOLATE, FERRITIN, TIBC, IRON, RETICCTPCT in the last 72 hours. Sepsis Labs: No results for input(s): PROCALCITON, LATICACIDVEN in the last 168 hours.  Recent Results (from the past 240 hour(s))  MRSA PCR Screening     Status: None   Collection Time: 01/01/2019 11:26 PM  Result Value Ref Range Status   MRSA by PCR NEGATIVE NEGATIVE Final    Comment:        The  GeneXpert MRSA Assay (FDA approved for NASAL specimens only), is one component of a comprehensive MRSA colonization surveillance program. It is not intended to diagnose MRSA infection nor to guide or monitor treatment for MRSA infections. Performed at [facility], [address] [place], [place] [address]   Urine Culture     Status: Abnormal   Collection Time: 12/11/18  8:04 AM  Result Value Ref Range Status   Specimen Description URINE, RANDOM  Final   Special Requests   Final    NONE Performed at [facility], [address] [place], [place] [address]    Culture 30,000 COLONIES/mL ENTEROCOCCUS FAECALIS (A)  Final   Report Status 12/13/2018 FINAL  Final   Organism ID, Bacteria ENTEROCOCCUS FAECALIS (A)  Final      Susceptibility   Enterococcus faecalis - MIC*    AMPICILLIN <=2 SENSITIVE  Sensitive     LEVOFLOXACIN 0.5 SENSITIVE Sensitive     NITROFURANTOIN <=16 SENSITIVE Sensitive     VANCOMYCIN 1 SENSITIVE Sensitive     * 30,000 COLONIES/mL ENTEROCOCCUS FAECALIS  Culture, blood (Routine X 2) w Reflex to ID Panel     Status: None (Preliminary result)   Collection Time: 12/11/18  8:55 AM  Result Value Ref Range Status   Specimen Description BLOOD RIGHT ANTECUBITAL  Final   Special Requests   Final    BOTTLES DRAWN AEROBIC AND ANAEROBIC Blood Culture adequate volume   Culture   Final    NO GROWTH 2 DAYS Performed at [facility], [address] [place], [place] [address]    Report Status PENDING  Incomplete  Culture, blood (Routine X 2) w Reflex to ID Panel     Status: None (Preliminary result)   Collection Time: 12/11/18  8:59 AM  Result Value Ref Range Status   Specimen Description BLOOD LEFT HAND  Final   Special Requests   Final    BOTTLES DRAWN AEROBIC AND ANAEROBIC Blood Culture adequate volume   Culture   Final    NO GROWTH 2 DAYS Performed at [facility], [address] [place], [place] [address]    Report Status PENDING  Incomplete         Radiology Studies: Dg Chest Port 1 View  Result Date: 12/13/2018 CLINICAL DATA:  Pneumothorax, COPD EXAM: PORTABLE CHEST 1 VIEW COMPARISON:  12/12/2018 FINDINGS: Two chest tubes are noted along the periphery of the left hemithorax 1 projecting up to the third posterior rib level and the second overlying the left costophrenic angle. No significant change in tiny left apical and peripheral pneumothorax superimposed upon emphysematous disease. Heart and mediastinal contours are stable without mediastinal shift. Aortic atherosclerosis is noted. Soft tissue emphysema along the periphery of the left hemithorax is unchanged. IMPRESSION: Stable appearance of the chest status post left-sided chest tube placement. Electronically Signed   By: Tollie Eth M.D.   On: 12/13/2018 00:41   Dg Chest Port 1 View  Result  Date: 12/12/2018 CLINICAL DATA:  Follow-up pneumothorax EXAM: PORTABLE CHEST 1 VIEW COMPARISON:  12/11/2018 FINDINGS: Two drainage catheters are identified and stable. Minimal residual pneumothorax is seen on the left. Subcutaneous emphysema is seen. Diffuse emphysematous changes are noted. No focal infiltrate or effusion is seen. IMPRESSION: Two chest tubes on the left with minimal pneumothorax. No significant recurrence is noted. Emphysematous changes stable from the previous exam. Electronically Signed   By: Alcide Clever M.D.   On: 12/12/2018 08:42   Dg  Chest Port 1 View  Result Date: 12/11/2018 CLINICAL DATA:  Follow-up left pneumothorax. EXAM: PORTABLE CHEST 1 VIEW COMPARISON:  Yesterday. FINDINGS: A pigtail catheter at the left lateral lung base is unchanged. Interval 2nd, short-segment pigtail catheter with its tip at the left lung apex, replacing the previously demonstrated larger caliber chest tube. No pneumothorax is seen at the spine. No significant change in subcutaneous emphysema. Minimal patchy opacity at the left lateral lung base. Otherwise, clear lungs. Normal sized heart. Diffuse osteopenia. IMPRESSION: 1. No pneumothorax. 2. Minimal patchy atelectasis or pneumonia at the left lateral lung base. Electronically Signed   By: Beckie Salts M.D.   On: 12/11/2018 14:51        Scheduled Meds: . aspirin EC  81 mg Oral Daily  . fluticasone furoate-vilanterol  1 puff Inhalation Daily  . mouth rinse  15 mL Mouth Rinse BID  . montelukast  10 mg Oral QHS   Continuous Infusions:    LOS: 4 days    Time spent: 35 mins    Ramiro Harvest, MD [facility]  If 7PM-7AM, please contact night-coverage [email or web address] 12/13/2018, 10:38 AM

## 2018-12-13 NOTE — Progress Notes (Addendum)
Procedure(s) (LRB): VIDEO BRONCHOSCOPY (N/A) possible VIDEO BRONCHOSCOPY WITH INSERTION OF INTERBRONCHIAL VALVE (IBV) (N/A) possible CHEST TUBE INSERTION (Left) Subjective: Awake and alert, resting in bed.  He reports no change in his respiratory status. O2 sats stable on 4 L oxygen by nasal cannula.  Objective: Vital signs in last 24 hours: Temp:  [97.7 F (36.5 C)-98.6 F (37 C)] 98.6 F (37 C) (03/08 1227) Pulse Rate:  [63-95] 79 (03/08 1227) Cardiac Rhythm: Normal sinus rhythm (03/08 0700) Resp:  [16-20] 19 (03/08 1227) BP: (109-125)/(45-75) 125/64 (03/08 1227) SpO2:  [94 %-100 %] 99 % (03/08 1227)   Intake/Output from previous day: 03/07 0701 - 03/08 0700 In: 2227.1 [P.O.:480; I.V.:1747.1] Out: 1020 [Urine:700; Chest Tube:320] Intake/Output this shift: Total I/O In: -  Out: 245 [Urine:175; Chest Tube:70]  General appearance: alert, cooperative and no distress Heart: irregularly irregular rhythm Lungs: Breath sounds are clear, diminished on the left. Post pleural pigtail catheters are secured.  Insertion sites are dry.  The anterior tube has a large active air leak.  Mild subcu emphysema on his anterior and lateral left chest wall is unchanged.     Lab Results: Recent Labs    12/12/18 0838 12/13/18 0412  WBC 12.2* 12.0*  HGB 12.0* 10.6*  HCT 38.0* 34.1*  PLT 336 332   BMET:  Recent Labs    12/12/18 0838 12/13/18 0412  NA 142 140  K 3.5 3.4*  CL 106 105  CO2 30 28  GLUCOSE 132* 95  BUN 17 15  CREATININE 1.00 0.85  CALCIUM 8.4* 8.0*    PT/INR: No results for input(s): LABPROT, INR in the last 72 hours. ABG No results found for: PHART, HCO3, TCO2, ACIDBASEDEF, O2SAT CBG (last 3)  No results for input(s): GLUCAP in the last 72 hours.  Assessment/Plan: S/P Procedure(s) (LRB): VIDEO BRONCHOSCOPY (N/A) possible VIDEO BRONCHOSCOPY WITH INSERTION OF INTERBRONCHIAL VALVE (IBV) (N/A) possible CHEST TUBE INSERTION (Left)   -Left-sided spontaneous  pneumothorax in a 78 year old male with extensive emphysematous disease.  His left lung is fully expanded after placement of a second pigtail catheter anteriorly.  There is an active air leak that is fairly large and unchanged from yesterday.  We will leave both chest tubes on suction.  Follow-up chest x-ray tomorrow morning. Dr. Dorris Fetch is considering placing endobronchial valves tomorrow in the OR.  Will keep NPO after midnight.   LOS: 4 days     Leary Roca, PA-C [phone] 12/13/2018  I have seen and examined the patient and agree with the assessment and plan as outlined.  Purcell Nails, MD 12/13/2018 3:24 PM

## 2018-12-14 ENCOUNTER — Inpatient Hospital Stay (HOSPITAL_COMMUNITY): Payer: Medicare Other

## 2018-12-14 ENCOUNTER — Encounter (HOSPITAL_COMMUNITY): Admission: AD | Disposition: E | Payer: Self-pay | Source: Other Acute Inpatient Hospital | Attending: Internal Medicine

## 2018-12-14 ENCOUNTER — Inpatient Hospital Stay (HOSPITAL_COMMUNITY): Payer: Medicare Other | Admitting: Anesthesiology

## 2018-12-14 ENCOUNTER — Encounter (HOSPITAL_COMMUNITY): Payer: Self-pay | Admitting: Surgery

## 2018-12-14 DIAGNOSIS — J9311 Primary spontaneous pneumothorax: Secondary | ICD-10-CM

## 2018-12-14 HISTORY — PX: VIDEO BRONCHOSCOPY: SHX5072

## 2018-12-14 HISTORY — PX: VIDEO BRONCHOSCOPY WITH INSERTION OF INTERBRONCHIAL VALVE (IBV): SHX6178

## 2018-12-14 LAB — CBC
HEMATOCRIT: 33.3 % — AB (ref 39.0–52.0)
Hemoglobin: 10.6 g/dL — ABNORMAL LOW (ref 13.0–17.0)
MCH: 31.2 pg (ref 26.0–34.0)
MCHC: 31.8 g/dL (ref 30.0–36.0)
MCV: 97.9 fL (ref 80.0–100.0)
Platelets: 333 10*3/uL (ref 150–400)
RBC: 3.4 MIL/uL — ABNORMAL LOW (ref 4.22–5.81)
RDW: 11.9 % (ref 11.5–15.5)
WBC: 11.4 10*3/uL — AB (ref 4.0–10.5)
nRBC: 0 % (ref 0.0–0.2)

## 2018-12-14 LAB — BASIC METABOLIC PANEL
ANION GAP: 8 (ref 5–15)
BUN: 16 mg/dL (ref 8–23)
CO2: 24 mmol/L (ref 22–32)
Calcium: 8.1 mg/dL — ABNORMAL LOW (ref 8.9–10.3)
Chloride: 108 mmol/L (ref 98–111)
Creatinine, Ser: 0.85 mg/dL (ref 0.61–1.24)
GFR calc non Af Amer: >60 mL/min (ref >60)
Glucose, Bld: 87 mg/dL (ref 70–99)
Potassium: 4.1 mmol/L (ref 3.5–5.1)
Sodium: 140 mmol/L (ref 135–145)

## 2018-12-14 SURGERY — BRONCHOSCOPY, VIDEO-ASSISTED
Anesthesia: General

## 2018-12-14 MED ORDER — DEXAMETHASONE SODIUM PHOSPHATE 10 MG/ML IJ SOLN
INTRAMUSCULAR | Status: DC | PRN
  Administered 2018-12-14: 10 mg via INTRAVENOUS

## 2018-12-14 MED ORDER — SUGAMMADEX SODIUM 200 MG/2ML IV SOLN
INTRAVENOUS | Status: DC | PRN
  Administered 2018-12-14: 200 mg via INTRAVENOUS

## 2018-12-14 MED ORDER — LIDOCAINE 2% (20 MG/ML) 5 ML SYRINGE
INTRAMUSCULAR | Status: DC | PRN
  Administered 2018-12-14: 100 mg via INTRAVENOUS

## 2018-12-14 MED ORDER — PHENYLEPHRINE HCL 10 MG/ML IJ SOLN
INTRAMUSCULAR | Status: DC | PRN
  Administered 2018-12-14: 80 ug via INTRAVENOUS
  Administered 2018-12-14: 120 ug via INTRAVENOUS

## 2018-12-14 MED ORDER — SODIUM CHLORIDE 0.9 % IV SOLN
INTRAVENOUS | Status: DC | PRN
  Administered 2018-12-14: 50 ug/min via INTRAVENOUS

## 2018-12-14 MED ORDER — PHENYLEPHRINE 40 MCG/ML (10ML) SYRINGE FOR IV PUSH (FOR BLOOD PRESSURE SUPPORT)
PREFILLED_SYRINGE | INTRAVENOUS | Status: AC
  Filled 2018-12-14: qty 10

## 2018-12-14 MED ORDER — LIDOCAINE 2% (20 MG/ML) 5 ML SYRINGE
INTRAMUSCULAR | Status: AC
  Filled 2018-12-14: qty 5

## 2018-12-14 MED ORDER — EPHEDRINE SULFATE 50 MG/ML IJ SOLN
INTRAMUSCULAR | Status: DC | PRN
  Administered 2018-12-14: 10 mg via INTRAVENOUS

## 2018-12-14 MED ORDER — ONDANSETRON HCL 4 MG/2ML IJ SOLN
INTRAMUSCULAR | Status: AC
  Filled 2018-12-14: qty 2

## 2018-12-14 MED ORDER — FENTANYL CITRATE (PF) 250 MCG/5ML IJ SOLN
INTRAMUSCULAR | Status: AC
  Filled 2018-12-14: qty 5

## 2018-12-14 MED ORDER — METOPROLOL TARTRATE 5 MG/5ML IV SOLN
5.0000 mg | Freq: Four times a day (QID) | INTRAVENOUS | Status: DC | PRN
  Administered 2018-12-16 – 2018-12-19 (×2): 5 mg via INTRAVENOUS
  Filled 2018-12-14 (×5): qty 5

## 2018-12-14 MED ORDER — PROPOFOL 10 MG/ML IV BOLUS
INTRAVENOUS | Status: AC
  Filled 2018-12-14: qty 20

## 2018-12-14 MED ORDER — DEXAMETHASONE SODIUM PHOSPHATE 10 MG/ML IJ SOLN
INTRAMUSCULAR | Status: AC
  Filled 2018-12-14: qty 1

## 2018-12-14 MED ORDER — 0.9 % SODIUM CHLORIDE (POUR BTL) OPTIME
TOPICAL | Status: DC | PRN
  Administered 2018-12-14: 1000 mL

## 2018-12-14 MED ORDER — FENTANYL CITRATE (PF) 100 MCG/2ML IJ SOLN
INTRAMUSCULAR | Status: DC | PRN
  Administered 2018-12-14: 50 ug via INTRAVENOUS

## 2018-12-14 MED ORDER — EPINEPHRINE PF 1 MG/ML IJ SOLN
INTRAMUSCULAR | Status: AC
  Filled 2018-12-14: qty 1

## 2018-12-14 MED ORDER — PROPOFOL 10 MG/ML IV BOLUS
INTRAVENOUS | Status: DC | PRN
  Administered 2018-12-14: 120 mg via INTRAVENOUS

## 2018-12-14 MED ORDER — LACTATED RINGERS IV SOLN
INTRAVENOUS | Status: DC | PRN
  Administered 2018-12-14: 13:00:00 via INTRAVENOUS

## 2018-12-14 MED ORDER — SODIUM CHLORIDE 0.45 % IV SOLN
INTRAVENOUS | Status: DC
  Administered 2018-12-14 – 2018-12-15 (×2): via INTRAVENOUS

## 2018-12-14 MED ORDER — ROCURONIUM BROMIDE 50 MG/5ML IV SOSY
PREFILLED_SYRINGE | INTRAVENOUS | Status: DC | PRN
  Administered 2018-12-14: 40 mg via INTRAVENOUS

## 2018-12-14 MED ORDER — ONDANSETRON HCL 4 MG/2ML IJ SOLN
INTRAMUSCULAR | Status: DC | PRN
  Administered 2018-12-14: 4 mg via INTRAVENOUS

## 2018-12-14 MED ORDER — GLYCOPYRROLATE PF 0.2 MG/ML IJ SOSY
PREFILLED_SYRINGE | INTRAMUSCULAR | Status: DC | PRN
  Administered 2018-12-14: .1 mg via INTRAVENOUS

## 2018-12-14 MED ORDER — ROCURONIUM BROMIDE 50 MG/5ML IV SOSY
PREFILLED_SYRINGE | INTRAVENOUS | Status: AC
  Filled 2018-12-14: qty 5

## 2018-12-14 MED ORDER — METOPROLOL TARTRATE 5 MG/5ML IV SOLN: 5.0000 mg | Freq: Four times a day (QID) | INTRAVENOUS | Status: DC | PRN

## 2018-12-14 SURGICAL SUPPLY — 47 items
ADAPTER VALVE BIOPSY EBUS (MISCELLANEOUS) IMPLANT
ADPTR VALVE BIOPSY EBUS (MISCELLANEOUS)
BRUSH CYTOL CELLEBRITY 1.5X140 (MISCELLANEOUS) IMPLANT
CANISTER SUCT 3000ML PPV (MISCELLANEOUS) ×4 IMPLANT
CATH BALLN 4FR (CATHETERS) ×3 IMPLANT
CATH EMB 6FR 80CM (CATHETERS) ×6 IMPLANT
CATH LOADER DEPLOYMENT HUD (CATHETERS) ×3 IMPLANT
CONT SPEC 4OZ CLIKSEAL STRL BL (MISCELLANEOUS) ×8 IMPLANT
COVER BACK TABLE 60X90IN (DRAPES) ×4 IMPLANT
COVER WAND RF STERILE (DRAPES) ×4 IMPLANT
FILTER STRAW FLUID ASPIR (MISCELLANEOUS) IMPLANT
FORCEPS BIOP RJ4 1.8 (CUTTING FORCEPS) IMPLANT
FORCEPS RADIAL JAW LRG 4 PULM (INSTRUMENTS) ×1 IMPLANT
GAUZE SPONGE 4X4 12PLY STRL (GAUZE/BANDAGES/DRESSINGS) ×4 IMPLANT
GLOVE SURG SIGNA 7.5 PF LTX (GLOVE) ×4 IMPLANT
GOWN STRL REUS W/ TWL LRG LVL3 (GOWN DISPOSABLE) ×2 IMPLANT
GOWN STRL REUS W/ TWL XL LVL3 (GOWN DISPOSABLE) ×2 IMPLANT
GOWN STRL REUS W/TWL LRG LVL3 (GOWN DISPOSABLE) ×2
GOWN STRL REUS W/TWL XL LVL3 (GOWN DISPOSABLE) ×2
KIT AIRWAY SIZING HUD (KITS) ×3 IMPLANT
KIT CLEAN ENDO COMPLIANCE (KITS) ×4 IMPLANT
KIT TURNOVER KIT B (KITS) ×4 IMPLANT
MARKER SKIN DUAL TIP RULER LAB (MISCELLANEOUS) ×4 IMPLANT
NS IRRIG 1000ML POUR BTL (IV SOLUTION) ×4 IMPLANT
OIL SILICONE PENTAX (PARTS (SERVICE/REPAIRS)) ×4 IMPLANT
PAD ARMBOARD 7.5X6 YLW CONV (MISCELLANEOUS) ×8 IMPLANT
RADIAL JAW LRG 4 PULMONARY (INSTRUMENTS) ×2
STOPCOCK 4 WAY LG BORE MALE ST (IV SETS) ×9 IMPLANT
STOPCOCK MORSE 400PSI 3WAY (MISCELLANEOUS) ×4 IMPLANT
SYR 10ML LL (SYRINGE) ×4 IMPLANT
SYR 20ML ECCENTRIC (SYRINGE) ×8 IMPLANT
SYR 3ML LL SCALE MARK (SYRINGE) ×3 IMPLANT
SYR 5ML LL (SYRINGE) ×4 IMPLANT
SYR 5ML LUER SLIP (SYRINGE) ×4 IMPLANT
TOWEL GREEN STERILE (TOWEL DISPOSABLE) ×4 IMPLANT
TOWEL GREEN STERILE FF (TOWEL DISPOSABLE) ×4 IMPLANT
TOWEL NATURAL 4PK STERILE (DISPOSABLE) ×4 IMPLANT
TRAP SPECIMEN MUCOUS 40CC (MISCELLANEOUS) ×4 IMPLANT
TUBE CONNECTING 20'X1/4 (TUBING) ×1
TUBE CONNECTING 20X1/4 (TUBING) ×3 IMPLANT
UNDERPAD 30X30 (UNDERPADS AND DIAPERS) ×4 IMPLANT
VALVE BIOPSY  SINGLE USE (MISCELLANEOUS) ×2
VALVE BIOPSY SINGLE USE (MISCELLANEOUS) ×2 IMPLANT
VALVE IN CARTRIDGE 7MM HUD (Valve) ×3 IMPLANT
VALVE IN CARTRIDGE 9MM HUD (Valve) ×6 IMPLANT
VALVE SUCTION BRONCHIO DISP (MISCELLANEOUS) ×4 IMPLANT
WATER STERILE IRR 1000ML POUR (IV SOLUTION) ×4 IMPLANT

## 2018-12-14 NOTE — Transfer of Care (Signed)
Immediate Anesthesia Transfer of Care Note  Patient: Delfin Edis  Procedure(s) Performed: VIDEO BRONCHOSCOPY (N/A ) possible VIDEO BRONCHOSCOPY WITH INSERTION OF INTERBRONCHIAL VALVE (IBV) (N/A )  Patient Location: PACU  Anesthesia Type:General  Level of Consciousness: drowsy and patient cooperative  Airway & Oxygen Therapy: Patient Spontanous Breathing and Patient connected to nasal cannula oxygen  Post-op Assessment: Report given to RN and Post -op Vital signs reviewed and stable  Post vital signs: Reviewed and stable  Last Vitals:  Vitals Value Taken Time  BP 99/48 12-30-18  2:55 PM  Temp    Pulse 91 12/30/18  2:55 PM  Resp 14 December 30, 2018  2:55 PM  SpO2 94 % 12/30/2018  2:55 PM  Vitals shown include unvalidated device data.  Last Pain:  Vitals:   2018-12-30 0300  TempSrc: Oral  PainSc:       Patients Stated Pain Goal: 3 (12/12/18 0848)  Complications: No apparent anesthesia complications

## 2018-12-14 NOTE — Anesthesia Preprocedure Evaluation (Addendum)
Anesthesia Evaluation  Patient identified by MRN, date of birth, ID band Patient awake    Reviewed: Allergy & Precautions, NPO status , Patient's Chart, lab work & pertinent test results  Airway Mallampati: II  TM Distance: >3 FB Neck ROM: Full    Dental  (+) Dental Advisory Given, Poor Dentition, Loose   Pulmonary COPD, former smoker,    Pulmonary exam normal breath sounds clear to auscultation       Cardiovascular hypertension, Pt. on medications Normal cardiovascular exam Rhythm:Regular Rate:Normal     Neuro/Psych negative neurological ROS  negative psych ROS   GI/Hepatic negative GI ROS, Neg liver ROS,   Endo/Other  negative endocrine ROS  Renal/GU negative Renal ROS     Musculoskeletal negative musculoskeletal ROS (+)   Abdominal   Peds  Hematology negative hematology ROS (+)   Anesthesia Other Findings Loose lower front teeth.  Very poor condition.  Reproductive/Obstetrics                            Anesthesia Physical Anesthesia Plan  ASA: III  Anesthesia Plan: General   Post-op Pain Management:    Induction: Intravenous  PONV Risk Score and Plan: 2 and Ondansetron, Dexamethasone and Treatment may vary due to age or medical condition  Airway Management Planned: Oral ETT  Additional Equipment: None  Intra-op Plan:   Post-operative Plan: Extubation in OR  Informed Consent: I have reviewed the patients History and Physical, chart, labs and discussed the procedure including the risks, benefits and alternatives for the proposed anesthesia with the patient or authorized representative who has indicated his/her understanding and acceptance.     Dental advisory given and Dental Advisory Given  Plan Discussed with: CRNA  Anesthesia Plan Comments:        Anesthesia Quick Evaluation

## 2018-12-14 NOTE — Anesthesia Postprocedure Evaluation (Signed)
Anesthesia Post Note  Patient: COLLIE BAMBRICK  Procedure(s) Performed: VIDEO BRONCHOSCOPY (N/A ) possible VIDEO BRONCHOSCOPY WITH INSERTION OF INTERBRONCHIAL VALVE (IBV) (N/A )     Patient location during evaluation: PACU Anesthesia Type: General Level of consciousness: sedated and patient cooperative Pain management: pain level controlled Vital Signs Assessment: post-procedure vital signs reviewed and stable Respiratory status: spontaneous breathing Cardiovascular status: stable Anesthetic complications: no    Last Vitals:  Vitals:   31-Dec-2018 0847 2018-12-31 1455  BP: (!) 118/55 (!) 99/48  Pulse: 77 91  Resp: 18 17  Temp: 37.1 C (!) 36.2 C  SpO2: 99% 94%    Last Pain:  Vitals:   12-31-2018 1455  TempSrc:   PainSc: Asleep                 Lewie Loron

## 2018-12-14 NOTE — Anesthesia Procedure Notes (Signed)
Procedure Name: Intubation Date/Time: 12/23/2018 1:21 PM Performed by: Neldon Newport, CRNA Pre-anesthesia Checklist: Timeout performed, Patient being monitored, Suction available, Emergency Drugs available and Patient identified Patient Re-evaluated:Patient Re-evaluated prior to induction Oxygen Delivery Method: Circle system utilized Preoxygenation: Pre-oxygenation with 100% oxygen Induction Type: IV induction Ventilation: Mask ventilation without difficulty Laryngoscope Size: Mac and 4 Grade View: Grade III Tube type: Oral Tube size: 8.5 mm Number of attempts: 2 Placement Confirmation: breath sounds checked- equal and bilateral,  positive ETCO2 and ETT inserted through vocal cords under direct vision Secured at: 23 cm Tube secured with: Tape Dental Injury: Teeth and Oropharynx as per pre-operative assessment

## 2018-12-14 NOTE — Interval H&P Note (Signed)
History and Physical Interval Note: New chest tube placed last week. Ongoing air leak. Plan bronch for possible IBV placement. Patient is aware of risks and benefits as well as possibility of success. He agrees to proceed.  12/17/2018 1:03 PM  Matthew Cuevas  has presented today for surgery, with the diagnosis of LEFT PTX AIRLEAK.  The various methods of treatment have been discussed with the patient and family. After consideration of risks, benefits and other options for treatment, the patient has consented to  Procedure(s): VIDEO BRONCHOSCOPY (N/A) possible VIDEO BRONCHOSCOPY WITH INSERTION OF INTERBRONCHIAL VALVE (IBV) (N/A) possible CHEST TUBE INSERTION (Left) as a surgical intervention.  [name] history has been reviewed, patient examined, no change in status, stable for surgery.  I have reviewed the patient's chart and labs.  Questions were answered to the patient's satisfaction.     [name]

## 2018-12-14 NOTE — Brief Op Note (Signed)
December 29, 2018  2:52 PM  PATIENT:  Matthew Cuevas  78 y.o. male  PRE-OPERATIVE DIAGNOSIS:  LEFT PNEUMOTHORAX WITH PROLONGED AIR LEAK  POST-OPERATIVE DIAGNOSIS: LEFT PNEUMOTHORAX WITH PROLONGED AIR LEAK  PROCEDURE:  Procedure(s): VIDEO BRONCHOSCOPY (N/A) possible VIDEO BRONCHOSCOPY WITH INSERTION OF INTERBRONCHIAL VALVE (IBV) (N/A)  SURGEON:  Surgeon(s) and Role:    * [name], MD - Primary  PHYSICIAN ASSISTANT:   ASSISTANTS: none   ANESTHESIA:   general  EBL:  minimal   BLOOD ADMINISTERED:none  DRAINS: valves in left sided bronchi   LOCAL MEDICATIONS USED:  NONE  SPECIMEN:  No Specimen  DISPOSITION OF SPECIMEN:  N/A  COUNTS:  NO endoscopic  TOURNIQUET:  * No tourniquets in log *  DICTATION: .Other Dictation: Dictation Number -  PLAN OF CARE: Discharge to home after PACU  PATIENT DISPOSITION:  PACU - hemodynamically stable.   Delay start of Pharmacological VTE agent (>24hrs) due to surgical blood loss or risk of bleeding: not applicable

## 2018-12-14 NOTE — Care Management Important Message (Signed)
Important Message  Patient Details  Name: Matthew Cuevas MRN: [id number] Date of Birth: 09/17/1941   Medicare Important Message Given:  Yes    Dorena Bodo 01-07-19, 3:56 PM

## 2018-12-14 NOTE — Progress Notes (Signed)
12/30/2018 Central monitor called patient heart rate in the 130's sustain at 1840. Dr Janee Morn. Rn called Dr Dorris Fetch concerning the heart rate and the bubbling from the Chest tube. 12 Lead EKG was completes Sinus tach and vent rate 132. Both MD's were mad aware. Order was given for chest x ray from Dr Dorris Fetch (Cardiothoracic Surgery). Dr Janee Morn was text via amion at 1930 that Heart rate was now 9y to 102. Order for Lopressor 5mg  every 6 hours prn for Heart rate sustain >130 was given by Dr Janee Morn ([facility]). [facility] RN.

## 2018-12-14 NOTE — Progress Notes (Signed)
PROGRESS NOTE    Matthew Cuevas  ZOX:[id number] DOB: 1940/11/06 DOA: 12/19/18 PCP: System, Pcp Not In    Brief Narrative:  HPI per Dr. Julian Reil HPI: Matthew Cuevas is a 78 y.o. male with medical history significant of COPD and HTN.  Patient was admitted to [facility] on 11/29/2018 for spontaneous L PTX secondary to emphysematous bleb rupture.  Initially controlled with small bore thoracostomy tube placement. Course complicated by subcutaneous emphysema and persistent airleak. Large bore thoracostomy tube placed with attempted chemical pleurodesis on 12/07/2018. Procedure complicated by significant pain, hypoxemia and respiratory distress.  Patient transferred to ICU service requiring O2 delivery by Optiflow to maintain saturations. Follow up imaging showing persistent left pneumothorax with increasing respiratory distress. Small bore thoracostomy tube placed under CT guidance on 12/08/2018 with significant improvement in symptoms. Patient was weaned down to 2L [place] and family has requested transfer to [facility] for continued management.  He arrives to [facility] in stable condition, satting 100% on 2L, RR 20, BP 142/42, thankfully not requiring pressors (he had been requiring levophed earlier in the day prior to transfer apparently).  He and wife report improvement in the swelling around his face (subcutaneous emphysema).  He reports he isnt in any respiratory distress right now, though he will get short of breath if he tries to stand up and walk to the bathroom he says.   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  Patient with some improvement with shortness of breath.  CT chest done the evening of 12/10/2018, with large anterior pneumothorax.  Patient status post pigtail catheter placement per CT surgery 12/11/2018 with improvement with breath sounds on the left.  Patient still with shortness of breath on minimal exertion as he states he has winded when going to the bedside commode. Repeat chest x-ray this morning pending. Emphysematous changes stable from previous exam.  Per CT surgery patient still with an active air leak which is fairly large and unchanged and patient for probable placement of endobronchial valves today per Dr. Dorris Fetch in the OR.  Patient has been n.p.o. since midnight.  Management per cardiothoracic surgery.  2.  COPD Stable.  Continue home regimen of Brio Ellipta, Nebs as needed.  Discontinued Singulair and started on Claritin as patient with complaints of still increasing congestion.  Congestion improving.  3.  Leukocytosis Likely reactive leukocytosis secondary to problem #1.  Leukocytosis trending down.  Urinalysis unremarkable.  CT chest negative for any acute infiltrate.  Blood cultures pending with no growth to date..  Patient afebrile.  Leukocytosis slowly trending down.  No need for antibiotics at this time.  Follow.    4.  Hypernatremia Improved with change of IV fluids.  Follow.  5.  Hypokalemia Repleted.   DVT prophylaxis: SCDs Code Status: Full Family Communication: Updated patient and wife at bedside. Disposition Plan: Likely home when clinically improved and when okay with CT surgery.     Consultants:   Cardiothoracic surgery: Dr. Dorris Fetch 12/10/2018  Procedures:   CT chest 12/10/2018  Chest x-ray 12/10/2018, 12/12/2018, 12/13/2018, 12/12/2018  Insertion of 14 French pigtail catheter per  Dr. Dorris Fetch, CT surgery 12/11/2018  Antimicrobials:   None   Subjective: Patient sitting up in bed.  States  has some shortness of breath on going to the bedside commode.  Denies any chest pain.  States improvement with congestion.  No nausea or vomiting.  Has been n.p.o. since midnight with some questions about procedure for today.    Objective: Vitals:   12/13/18 2300 12/06/2018 0007 12/22/2018 0300 12/12/2018 0742  BP: 102/60 102/60 (!) 114/52   Pulse: 74 74 79   Resp: 18 18 19    Temp: 97.9 F (36.6 C) 97.9 F (36.6 C) 98.2 F (36.8 C)   TempSrc: Oral Oral Oral   SpO2: 100% 100% 100% 100%  Weight:      Height:        Intake/Output Summary (Last 24 hours) at 12/28/2018 0829 Last data filed at 12/13/2018 0600 Gross per 24 hour  Intake -  Output 350 ml  Net -350 ml   Filed Weights   12-24-18 2300  Weight: 63.5 kg    Examination:  General exam: NAD Respiratory system: Fair air movement.  No wheezes, no crackles, no rhonchi.  Decreasing crepitus lateral left chest wall consistent with subcutaneous emphysema.  Chest tubes in place.  Cardiovascular system: Regular rate rhythm no murmurs rubs or gallops.  No JVD.  No lower extremity edema.  Gastrointestinal system: Abdomen is soft, nontender, nondistended, positive bowel sounds.  No rebound.  No guarding.  Central nervous system: Alert and oriented. No focal neurological deficits. Extremities: Symmetric 5 x 5 power. Skin: No rashes, lesions or ulcers Psychiatry: Judgement and insight appear normal. Mood & affect appropriate.     Data Reviewed: I have personally reviewed following labs and imaging studies  CBC: Recent Labs  Lab 12/10/18 0205 12/11/18 0238 12/12/18 0838 12/13/18 0412 12/07/2018 0253  WBC 13.5* 14.3* 12.2* 12.0* 11.4*  NEUTROABS  --   --  9.0*  --   --   HGB 11.8* 11.2* 12.0* 10.6* 10.6*  HCT 38.3* 36.5* 38.0* 34.1* 33.3*  MCV 100.8* 101.7* 101.1* 99.7 97.9  PLT 352 328 336 332 333   Basic Metabolic Panel: Recent Labs  Lab 12/10/18 0205 12/11/18 0238 12/12/18 0838 12/13/18 0412 12/20/2018 0253  NA 145 146* 142  140 140  K 3.4* 3.7 3.5 3.4* 4.1  CL 105 110 106 105 108  CO2 31 31 30 28 24   GLUCOSE 98 97 132* 95 87  BUN 31* 22 17 15 16   CREATININE 1.18 1.12 1.00 0.85 0.85  CALCIUM 8.7* 8.4* 8.4* 8.0* 8.1*   GFR: Estimated Creatinine Clearance: 66.4 mL/min (by C-G formula based on SCr of 0.85 mg/dL). Liver Function Tests: Recent Labs  Lab 12/10/18 0205  AST 18  ALT 29  ALKPHOS 47  BILITOT 0.6  PROT 6.1*  ALBUMIN 3.4*   No results for input(s): LIPASE, AMYLASE in the last 168 hours. No results for input(s): AMMONIA in the last 168 hours. Coagulation Profile: No results for input(s): INR, PROTIME in the last 168 hours. Cardiac Enzymes: No results for input(s): CKTOTAL, CKMB, CKMBINDEX, TROPONINI in the last 168 hours. BNP (last 3 results) No results for input(s): PROBNP in the last 8760 hours. HbA1C: No results for input(s): HGBA1C in the last 72 hours. CBG: No results for input(s): GLUCAP in the last 168 hours. Lipid Profile: No results for input(s): CHOL, HDL, LDLCALC, TRIG, CHOLHDL, LDLDIRECT in the last 72 hours. Thyroid Function Tests: No results for input(s): TSH, T4TOTAL, FREET4, T3FREE, THYROIDAB in  the last 72 hours. Anemia Panel: No results for input(s): VITAMINB12, FOLATE, FERRITIN, TIBC, IRON, RETICCTPCT in the last 72 hours. Sepsis Labs: No results for input(s): PROCALCITON, LATICACIDVEN in the last 168 hours.  Recent Results (from the past 240 hour(s))  MRSA PCR Screening     Status: None   Collection Time: 12/27/2018 11:26 PM  Result Value Ref Range Status   MRSA by PCR NEGATIVE NEGATIVE Final    Comment:        The GeneXpert MRSA Assay (FDA approved for NASAL specimens only), is one component of a comprehensive MRSA colonization surveillance program. It is not intended to diagnose MRSA infection nor to guide or monitor treatment for MRSA infections. Performed at [facility], [address], [place], [place] [address]   Urine Culture     Status:  Abnormal   Collection Time: 12/11/18  8:04 AM  Result Value Ref Range Status   Specimen Description URINE, RANDOM  Final   Special Requests   Final    NONE Performed at [facility], [address], [place], [place] [address]    Culture 30,000 COLONIES/mL ENTEROCOCCUS FAECALIS (A)  Final   Report Status 12/13/2018 FINAL  Final   Organism ID, Bacteria ENTEROCOCCUS FAECALIS (A)  Final      Susceptibility   Enterococcus faecalis - MIC*    AMPICILLIN <=2 SENSITIVE Sensitive     LEVOFLOXACIN 0.5 SENSITIVE Sensitive     NITROFURANTOIN <=16 SENSITIVE Sensitive     VANCOMYCIN 1 SENSITIVE Sensitive     * 30,000 COLONIES/mL ENTEROCOCCUS FAECALIS  Culture, blood (Routine X 2) w Reflex to ID Panel     Status: None (Preliminary result)   Collection Time: 12/11/18  8:55 AM  Result Value Ref Range Status   Specimen Description BLOOD RIGHT ANTECUBITAL  Final   Special Requests   Final    BOTTLES DRAWN AEROBIC AND ANAEROBIC Blood Culture adequate volume   Culture   Final    NO GROWTH 3 DAYS Performed at [facility], [address] [place], [place] [address]    Report Status PENDING  Incomplete  Culture, blood (Routine X 2) w Reflex to ID Panel     Status: None (Preliminary result)   Collection Time: 12/11/18  8:59 AM  Result Value Ref Range Status   Specimen Description BLOOD LEFT HAND  Final   Special Requests   Final    BOTTLES DRAWN AEROBIC AND ANAEROBIC Blood Culture adequate volume   Culture   Final    NO GROWTH 3 DAYS Performed at [facility], [address] [place], [place] [address]    Report Status PENDING  Incomplete         Radiology Studies: Dg Chest Port 1 View  Result Date: 12/13/2018 CLINICAL DATA:  Follow-up pneumothorax EXAM: PORTABLE CHEST 1 VIEW COMPARISON:  12/12/2018 FINDINGS: Two chest tubes are again identified and stable. The stable small left apical pneumothorax is again seen. Lungs are hyperinflated. No new focal abnormality is noted.  IMPRESSION: No change from the previous day. Electronically Signed   By: Alcide Clever M.D.   On: 12/13/2018 11:39   Dg Chest Port 1 View  Result Date: 12/13/2018 CLINICAL DATA:  Pneumothorax, COPD EXAM: PORTABLE CHEST 1 VIEW COMPARISON:  12/12/2018 FINDINGS: Two chest tubes are noted along the periphery of the left hemithorax 1 projecting up to the third posterior rib level and the second overlying the left costophrenic angle. No significant change  in tiny left apical and peripheral pneumothorax superimposed upon emphysematous disease. Heart and mediastinal contours are stable without mediastinal shift. Aortic atherosclerosis is noted. Soft tissue emphysema along the periphery of the left hemithorax is unchanged. IMPRESSION: Stable appearance of the chest status post left-sided chest tube placement. Electronically Signed   By: Tollie Ethavid  Kwon M.D.   On: 12/13/2018 00:41        Scheduled Meds: . aspirin EC  81 mg Oral Daily  . fluticasone furoate-vilanterol  1 puff Inhalation Daily  . loratadine  10 mg Oral Daily  . mouth rinse  15 mL Mouth Rinse BID   Continuous Infusions:    LOS: 5 days    Time spent: 35 mins    Ramiro Harvestaniel , MD [facility]  If 7PM-7AM, please contact night-coverage [email or web address] 01/04/2019, 8:29 AM

## 2018-12-15 ENCOUNTER — Encounter (HOSPITAL_COMMUNITY): Payer: Self-pay | Admitting: Thoracic Surgery (Cardiothoracic Vascular Surgery)

## 2018-12-15 ENCOUNTER — Inpatient Hospital Stay (HOSPITAL_COMMUNITY): Payer: Medicare Other

## 2018-12-15 DIAGNOSIS — R000 Tachycardia, unspecified: Secondary | ICD-10-CM

## 2018-12-15 LAB — CBC
HCT: 33 % — ABNORMAL LOW (ref 39.0–52.0)
Hemoglobin: 10.8 g/dL — ABNORMAL LOW (ref 13.0–17.0)
MCH: 32.1 pg (ref 26.0–34.0)
MCHC: 32.7 g/dL (ref 30.0–36.0)
MCV: 98.2 fL (ref 80.0–100.0)
NRBC: 0 % (ref 0.0–0.2)
Platelets: 353 10*3/uL (ref 150–400)
RBC: 3.36 MIL/uL — ABNORMAL LOW (ref 4.22–5.81)
RDW: 12.1 % (ref 11.5–15.5)
WBC: 14.9 10*3/uL — AB (ref 4.0–10.5)

## 2018-12-15 LAB — BASIC METABOLIC PANEL
Anion gap: 7 (ref 5–15)
BUN: 16 mg/dL (ref 8–23)
CO2: 26 mmol/L (ref 22–32)
Calcium: 8.3 mg/dL — ABNORMAL LOW (ref 8.9–10.3)
Chloride: 105 mmol/L (ref 98–111)
Creatinine, Ser: 0.93 mg/dL (ref 0.61–1.24)
GFR calc Af Amer: >60 mL/min (ref >60)
GFR calc non Af Amer: >60 mL/min (ref >60)
Glucose, Bld: 124 mg/dL — ABNORMAL HIGH (ref 70–99)
Potassium: 4.6 mmol/L (ref 3.5–5.1)
SODIUM: 138 mmol/L (ref 135–145)

## 2018-12-15 MED ORDER — GUAIFENESIN ER 600 MG PO TB12
1200.0000 mg | ORAL_TABLET | Freq: Two times a day (BID) | ORAL | Status: DC
  Administered 2018-12-15 – 2018-12-16 (×4): 1200 mg via ORAL
  Filled 2018-12-15 (×5): qty 2

## 2018-12-15 MED ORDER — SODIUM CHLORIDE 0.9 % IV SOLN
INTRAVENOUS | Status: AC
  Administered 2018-12-15 – 2018-12-16 (×3): via INTRAVENOUS

## 2018-12-15 MED ORDER — SODIUM CHLORIDE 0.45 % IV SOLN: INTRAVENOUS | Status: DC

## 2018-12-15 MED ORDER — SODIUM CHLORIDE 0.9 % IV BOLUS
500.0000 mL | Freq: Once | INTRAVENOUS | Status: AC
  Administered 2018-12-15: 500 mL via INTRAVENOUS

## 2018-12-15 NOTE — Op Note (Signed)
NAME: Matthew Cuevas, Matthew Cuevas MEDICAL RECORD OZ:[id number] ACCOUNT [id number] DATE OF BIRTH:[birth year] FACILITY: [facility] LOCATION: [facility] PHYSICIAN:Anissia Wessells Lars Pinks, MD  OPERATIVE REPORT  DATE OF PROCEDURE:  12/23/2018  PREOPERATIVE DIAGNOSIS:  Spontaneous pneumothorax with prolonged air leak.  POSTOPERATIVE DIAGNOSIS:  Spontaneous pneumothorax with prolonged air leak.  PROCEDURE:  Bronchoscopy with endobronchial valve placement.  SURGEON:  Charlett Lango, MD  ASSISTANT:  None.  ANESTHESIA:  General.  FINDINGS:  Air leak reduced after valve replacement.  CLINICAL NOTE:  The patient is a 78 year old gentleman with a history of tobacco abuse and COPD.  He presented to [facility] with a spontaneous pneumothorax.  He had a prolonged air leak and unsuccessful attempt at pleurodesis.  He was  transferred to [facility].  An additional pigtail catheter was placed anteriorly.  He had good reexpansion of his lung.  There has been an ongoing large air leak.  He was advised to undergo bronchoscopy and intrabronchial valve placement for treatment of the  air leak.  The indications, risks, benefits, and alternatives were discussed in detail with the patient.  He understood there was no guarantee that the procedure would be successful.  OPERATIVE NOTE:  The patient was brought to the operating room on 12/15/2018.  He had induction of general anesthesia and was intubated.  There was a large air leak from the anterior chest tube and a small air leak from the posterior tube.  A timeout was  performed.  Flexible fiberoptic bronchoscopy was performed via the endotracheal tube.  It revealed normal endobronchial anatomy with no endobronchial lesions to the level of the subsegmental bronchi.  There were thick clear secretions bilaterally.  A #6  Fogarty catheter was inflated in the left mainstem airway and conclusion of the left mainstem airway resulted in complete cessation of the air  leak.  Inflating the balloon in the upper and lower lobe airways respectively did not result in resolution of  the air leak, although it did decrease in size both times.  The Fogarty catheter then was placed adjacent to the bronchoscope and advanced into the left lower lobe bronchus and inflated.  A second Fogarty catheter then was advanced through the  bronchoscope working channel and inflated in the left upper lobe bronchus.  Again, the air leak completely resolved, but with deflating either balloon, the air leak returned.  Attempt was made to identify further down segmental airways that might be  responsible.  It did not appear to be changed at all by inflation in the lingular or superior segmental bronchi.  There was a decrease in the air leak with inflation of balloons in the area of the anterior apical posterior segmental branches as well as  in the basilar segmental branches of the lower lobe.  The decision was made to place valves in these areas.  A 9 mm valve was placed into the apical posterior segmental bronchus.  While attempting to place a 7 mm valve in the anterior bronchus, that  valve was successfully placed, but the valve in the apical posterior bronchus became dislodged.  It was removed and replaced with a second 9 mm valve.  An attempt then was made to identify a specific basilar segmental bronchus or group of bronchi, but  the air leak decreased with inflation that occluded all of the bronchi, but did not change when the individual bronchi were inflated.  The decision was made to leave a 9 mm valve in the basilar segmental bronchus.  After this was placed,  there was a  decrease in the size of the air leak, but the air leak was still present from the anterior tube.  It was not felt this patient would tolerate additional airway occlusion.  He will be observed to determine if this will be successful or he will need a  thoracoscopy for bleb resection.  The patient was extubated in the  operating room and taken to the Postanesthetic Care Unit in good condition.  TN/NUANCE  D:12-18-2018 T:12/15/2018 JOB:[id number]

## 2018-12-15 NOTE — Progress Notes (Addendum)
PROGRESS NOTE    Matthew Cuevas  ZOX:[id number] DOB: November 30, 1940 DOA: 01/04/2019 PCP: System, Pcp Not In    Brief Narrative:  HPI per Dr. Julian Reil HPI: Matthew Cuevas is a 78 y.o. male with medical history significant of COPD and HTN.  Patient was admitted to [facility] on 11/29/2018 for spontaneous L PTX secondary to emphysematous bleb rupture.  Initially controlled with small bore thoracostomy tube placement. Course complicated by subcutaneous emphysema and persistent airleak. Large bore thoracostomy tube placed with attempted chemical pleurodesis on 12/07/2018. Procedure complicated by significant pain, hypoxemia and respiratory distress.  Patient transferred to ICU service requiring O2 delivery by Optif[employer] to maintain saturations. Follow up imaging showing persistent left pneumothorax with increasing respiratory distress. Small bore thoracostomy tube placed under CT guidance on 12/08/2018 with significant improvement in symptoms. Patient was weaned down to 2L [place] and family has requested transfer to [facility] for continued management.  He arrives to [facility] in stable condition, satting 100% on 2L, RR 20, BP 142/42, thankfully not requiring pressors (he had been requiring levophed earlier in the day prior to transfer apparently).  He and wife report improvement in the swelling around his face (subcutaneous emphysema).  He reports he isnt in any respiratory distress right now, though he will get short of breath if he tries to stand up and walk to the bathroom he says.   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  Patient with some improvement with shortness of breath.  CT chest done the evening of 12/10/2018, with large anterior pneumothorax.  Patient status post pigtail catheter placement per CT surgery 12/11/2018 with improvement with breath sounds on the left.  Patient still with shortness of breath on minimal exertion as he states he has winded when going to the bedside commode. Repeat chest x-ray this morning pending. Emphysematous changes stable from previous exam.  Per CT surgery patient still with an active air leak which is fairly large and unchanged and patient underwent bronchoscopy with endobronchial valve placement on 12/28/2018.  Management per cardiothoracic surgery.  2.  COPD Stable.  Patient noted to have some inspiratory and expiratory wheezing however per RN CTPA stated those wheezes were from endobronchial valves.  Continue home regimen of Brio Ellipta, Nebs as needed.  Discontinued Singulair and started on Claritin as patient with complaints of still increasing congestion.  Congestion improving.  Placed on Mucinex 1200 mg twice daily.  Hold off on steroids at this time as it is felt per cardiothoracic surgery that wheezing likely from endobronchial valves.  Place on scheduled nebs.  Follow.  3.  Leukocytosis Likely reactive leukocytosis secondary to problem #1.  Leukocytosis trending down.  Urinalysis unremarkable.  CT chest negative for any acute infiltrate.  Blood cultures pending with no growth to date..  Patient afebrile.  Leukocytosis fluctuating.  Patient status post endobronchial valve placement on 12/19/2018.  Urine cultures with 30,000 colonies of Enterococcus faecalis however patient is asymptomatic.  No need for antibiotics at this time.  Follow.   4.  Hypernatremia Improved with change of IV fluids.  Follow.  5.  Hypokalemia  Repleted.  6.  Sinus tachycardia Patient noted to have a sinus tachycardia last night  postoperatively which has since resolved.  Sinus tachycardia likely secondary to pulmonary process with spontaneous pneumothorax with prolonged air leak status post bronchoscopy with endobronchial valve placement.  Chest x-ray negative for any signs of infiltrate.  Urinalysis unremarkable.  No growth to date.  Urine culture with 30,000 colonies of Enterococcus faecalis however not significant as patient asymptomatic.  No need for antibiotics.  Heart rate improved. IV Lopressor as needed.  7.  Borderline blood pressure. Normal saline 500 cc bolus x1.  Change IV fluids to normal saline at 100 cc/h for the next 24 to 48 hours.  Follow.   DVT prophylaxis: SCDs Code Status: Full Family Communication: Updated patient and wife at bedside. Disposition Plan: Likely home when clinically improved and when okay with CT surgery.     Consultants:   Cardiothoracic surgery: Dr. Dorris Fetch 12/10/2018  Procedures:   CT chest 12/10/2018  Chest x-ray 12/10/2018, 12/12/2018, 12/13/2018, 01/04/2019  Insertion of 14 French pigtail catheter per Dr. Dorris Fetch, CT surgery 12/11/2018  Bronchoscopy with endobronchial valve placement per Dr. Dorris Fetch 12/15/2018  Antimicrobials:   None   Subjective: Patient sitting up in bed.  Wife at bedside.  Denies any chest pain.  Complaining of increasing mucus production.  States shortness of breath improving slowly from admission however not at baseline.  No nausea or vomiting.  States he thinks he might need to do the next thoracic/pulmonary procedure.  Patient noted to have some transient tachycardia last night with heart rates in the 130s which seems improved.  Objective: Vitals:   12/25/2018 2333 12/15/18 0325 12/15/18 0824 12/15/18 0844  BP: (!) 93/51 (!) 96/43  (!) 100/48  Pulse: 88 85  89  Resp: 18 14    Temp: 97.8 F (36.6 C) 98.4 F (36.9 C)  98.1 F (36.7 C)  TempSrc: Oral Oral  Oral  SpO2: 99% 94% 100% 98%  Weight:      Height:        Intake/Output Summary  (Last 24 hours) at 12/15/2018 1017 Last data filed at 12/15/2018 0600 Gross per 24 hour  Intake 1856.67 ml  Output 870 ml  Net 986.67 ml   Filed Weights   12/31/2018 2300  Weight: 63.5 kg    Examination:  General exam: NAD Respiratory system: Inspiratory and expiratory wheezing.  Fair air movement.  No crackles.  No rhonchi.  Decreasing crepitus lateral left chest wall consistent with subcutaneous emphysema.  Chest tubes in place. Cardiovascular system: RRR no murmurs rubs or gallops.  No JVD.  No lower extremity edema.  Gastrointestinal system: Abdomen is soft, nontender, nondistended, positive bowel sounds.  No rebound.  No guarding.  Central nervous system: Alert and oriented. No focal neurological deficits. Extremities: Symmetric 5 x 5 power. Skin: No rashes, lesions or ulcers Psychiatry: Judgement and insight appear normal. Mood & affect appropriate.     Data Reviewed: I have personally reviewed following labs and imaging studies  CBC: Recent Labs  Lab 12/11/18 0238 12/12/18 0838 12/13/18 0412 12/19/2018 0253 12/15/18 0320  WBC 14.3* 12.2* 12.0* 11.4* 14.9*  NEUTROABS  --  9.0*  --   --   --   HGB 11.2* 12.0* 10.6* 10.6* 10.8*  HCT 36.5* 38.0* 34.1* 33.3* 33.0*  MCV 101.7* 101.1* 99.7 97.9 98.2  PLT 328 336 332 333 353   Basic Metabolic Panel: Recent Labs  Lab 12/11/18 0238 12/12/18 4098 12/13/18 1191 01/01/2019 0253 12/15/18 0320  NA 146* 142 140 140 138  K 3.7 3.5 3.4* 4.1 4.6  CL 110 106 105 108 105  CO2 31 30 28 24 26   GLUCOSE 97 132* 95 87 124*  BUN 22 17 15 16 16   CREATININE 1.12 1.00 0.85 0.85 0.93  CALCIUM 8.4* 8.4* 8.0* 8.1* 8.3*   GFR: Estimated Creatinine Clearance: 60.7 mL/min (by C-G formula based on SCr of 0.93 mg/dL). Liver Function Tests: Recent Labs  Lab 12/10/18 0205  AST 18  ALT 29  ALKPHOS 47  BILITOT 0.6  PROT 6.1*  ALBUMIN 3.4*   No results for input(s): LIPASE, AMYLASE in the last 168 hours. No results for input(s): AMMONIA  in the last 168 hours. Coagulation Profile: No results for input(s): INR, PROTIME in the last 168 hours. Cardiac Enzymes: No results for input(s): CKTOTAL, CKMB, CKMBINDEX, TROPONINI in the last 168 hours. BNP (last 3 results) No results for input(s): PROBNP in the last 8760 hours. HbA1C: No results for input(s): HGBA1C in the last 72 hours. CBG: No results for input(s): GLUCAP in the last 168 hours. Lipid Profile: No results for input(s): CHOL, HDL, LDLCALC, TRIG, CHOLHDL, LDLDIRECT in the last 72 hours. Thyroid Function Tests: No results for input(s): TSH, T4TOTAL, FREET4, T3FREE, THYROIDAB in the last 72 hours. Anemia Panel: No results for input(s): VITAMINB12, FOLATE, FERRITIN, TIBC, IRON, RETICCTPCT in the last 72 hours. Sepsis Labs: No results for input(s): PROCALCITON, LATICACIDVEN in the last 168 hours.  Recent Results (from the past 240 hour(s))  MRSA PCR Screening     Status: None   Collection Time: 12/24/2018 11:26 PM  Result Value Ref Range Status   MRSA by PCR NEGATIVE NEGATIVE Final    Comment:        The GeneXpert MRSA Assay (FDA approved for NASAL specimens only), is one component of a comprehensive MRSA colonization surveillance program. It is not intended to diagnose MRSA infection nor to guide or monitor treatment for MRSA infections. Performed at [facility], [address] [place], [place] [address]   Urine Culture     Status: Abnormal   Collection Time: 12/11/18  8:04 AM  Result Value Ref Range Status   Specimen Description URINE, RANDOM  Final   Special Requests   Final    NONE Performed at [facility], [address], [place], [place] [address]    Culture 30,000 COLONIES/mL ENTEROCOCCUS FAECALIS (A)  Final   Report Status 12/13/2018 FINAL  Final   Organism ID, Bacteria ENTEROCOCCUS FAECALIS (A)  Final      Susceptibility   Enterococcus faecalis - MIC*    AMPICILLIN <=2 SENSITIVE Sensitive     LEVOFLOXACIN 0.5 SENSITIVE Sensitive      NITROFURANTOIN <=16 SENSITIVE Sensitive     VANCOMYCIN 1 SENSITIVE Sensitive     * 30,000 COLONIES/mL ENTEROCOCCUS FAECALIS  Culture, blood (Routine X 2) w Reflex to ID Panel     Status: None (Preliminary result)   Collection Time: 12/11/18  8:55 AM  Result Value Ref Range Status   Specimen Description BLOOD RIGHT ANTECUBITAL  Final   Special Requests   Final    BOTTLES DRAWN AEROBIC AND ANAEROBIC Blood Culture adequate volume   Culture   Final    NO GROWTH 4 DAYS Performed at [facility], [address], [place], [place] [address]    Report Status PENDING  Incomplete  Culture, blood (Routine X 2) w Reflex to ID Panel     Status:  None (Preliminary result)   Collection Time: 12/11/18  8:59 AM  Result Value Ref Range Status   Specimen Description BLOOD LEFT HAND  Final   Special Requests   Final    BOTTLES DRAWN AEROBIC AND ANAEROBIC Blood Culture adequate volume   Culture   Final    NO GROWTH 4 DAYS Performed at [facility], [address], [place], [place] [address]    Report Status PENDING  Incomplete         Radiology Studies: Dg Chest Port 1 View  Result Date: 12/15/2018 CLINICAL DATA:  Follow-up chest tube EXAM: PORTABLE CHEST 1 VIEW COMPARISON:  12/26/2018 FINDINGS: Two left-sided chest tubes are again identified and stable. Left apical pneumothorax is again seen and stable. The right lung remains clear. No bony abnormality is seen. Endobronchial valves are noted. No bony abnormality is seen. IMPRESSION: Chest tubes in place on the left. Endobronchial valves are again seen and stable. Tiny left apical pneumothorax is stable. Electronically Signed   By: Alcide Clever M.D.   On: 12/15/2018 07:19   Dg Chest Port 1 View  Result Date: 12/13/2018 CLINICAL DATA:  Air-leak EXAM: PORTABLE CHEST 1 VIEW COMPARISON:  12/28/2018, 12/13/2018, 12/12/2018, CT 12/10/2018 FINDINGS: Two left-sided chest tubes with inferior tube pigtail at the CP angle, superior chest tube tip  at the left apex. No significant change in small residual left apical pneumothorax. Small right-sided pleural effusion and hazy edema or atelectasis at the bases. Similar amount of left chest wall and axillary subcutaneous emphysema. Stable cardiomediastinal silhouette. Emphysematous disease. Small linear and rectangular opacities project over the left hilar region. Uncertain if these are surgical or external to the patient. IMPRESSION: Overall no significant interval change in small residual left apical pneumothorax or left chest wall emphysema. Small right pleural effusion with hazy atelectasis, edema or infiltrate at the right base. Electronically Signed   By: Jasmine Pang M.D.   On: 12/08/2018 19:44   Dg Chest Port 1 View  Result Date: 01/01/2019 CLINICAL DATA:  Pneumothorax. EXAM: PORTABLE CHEST 1 VIEW COMPARISON:  12/13/2018 FINDINGS: Stable position of left-sided chest tubes. Persistent small left apical pneumothorax. Hyperinflation of the lungs and upper lobe predominant emphysema. Small right pleural effusion. Normal cardiac silhouette. Calcific atherosclerotic disease of the aorta. Osseous structures are without acute abnormality. Left-sided chest wall emphysema. IMPRESSION: 1. Persistent small left apical pneumothorax. 2. Small right pleural effusion. Electronically Signed   By: Ted Mcalpine M.D.   On: 12/23/2018 08:52        Scheduled Meds: . aspirin EC  81 mg Oral Daily  . fluticasone furoate-vilanterol  1 puff Inhalation Daily  . loratadine  10 mg Oral Daily  . mouth rinse  15 mL Mouth Rinse BID   Continuous Infusions: . sodium chloride 100 mL/hr at 12/15/18 1009     LOS: 6 days    Time spent: 35 mins    Ramiro Harvest, MD [facility]  If 7PM-7AM, please contact night-coverage [email or web address] 12/15/2018, 10:17 AM

## 2018-12-15 NOTE — Progress Notes (Addendum)
      [address]       Matthew Cuevas [id number]             [phone]      1 Day Post-Op Procedure(s) (LRB): VIDEO BRONCHOSCOPY (N/A) possible VIDEO BRONCHOSCOPY WITH INSERTION OF INTERBRONCHIAL VALVE (IBV) (N/A)   Subjective:  No new complaints.  Feels okay. Had some questions about VATS procedure.  I have addressed.  Objective: Vital signs in last 24 hours: Temp:  [97.2 F (36.2 C)-98.4 F (36.9 C)] 98.1 F (36.7 C) (03/10 0844) Pulse Rate:  [62-92] 89 (03/10 0844) Cardiac Rhythm: Normal sinus rhythm (03/10 0700) Resp:  [14-18] 14 (03/10 0325) BP: (93-115)/(43-51) 100/48 (03/10 0844) SpO2:  [94 %-100 %] 98 % (03/10 0844)  Intake/Output from previous day: 03/09 0701 - 03/10 0700 In: 1856.7 [I.V.:1856.7] Out: 870 [Urine:750; Chest Tube:120]  General appearance: alert, cooperative and no distress Heart: regular rate and rhythm Lungs: clear to auscultation bilaterally Abdomen: soft, non-tender; bowel sounds normal; no masses,  no organomegaly Wound: clean and dry  Lab Results: Recent Labs    12/17/2018 0253 12/15/18 0320  WBC 11.4* 14.9*  HGB 10.6* 10.8*  HCT 33.3* 33.0*  PLT 333 353   BMET:  Recent Labs    12/30/2018 0253 12/15/18 0320  NA 140 138  K 4.1 4.6  CL 108 105  CO2 24 26  GLUCOSE 87 124*  BUN 16 16  CREATININE 0.85 0.93  CALCIUM 8.1* 8.3*    PT/INR: No results for input(s): LABPROT, INR in the last 72 hours. ABG No results found for: PHART, HCO3, TCO2, ACIDBASEDEF, O2SAT CBG (last 3)  No results for input(s): GLUCAP in the last 72 hours.  Assessment/Plan: S/P Procedure(s) (LRB): VIDEO BRONCHOSCOPY (N/A) possible VIDEO BRONCHOSCOPY WITH INSERTION OF INTERBRONCHIAL VALVE (IBV) (N/A)  1. Chest tube- + air leak, S/P EBV placement--- leave chest tube on suction today 2. Dispo- patient stable, leave chest tube on suction, care per primary, repeat CXR in AM   LOS: 6 days    Matthew Cuevas 12/15/2018 Patient seen and examined.   Agree with above.  No air leak from basilar tube, but still has a significant air leak from the apical tube.  I suspect he is going to require left VATS for bleb resection.  Will reassess tomorrow.  If surgery required, we will plan to do it Thursday afternoon.  Matthew Decent Dorris Fetch, MD [facility] [phone]

## 2018-12-16 ENCOUNTER — Inpatient Hospital Stay (HOSPITAL_COMMUNITY): Payer: Medicare Other

## 2018-12-16 ENCOUNTER — Encounter (HOSPITAL_COMMUNITY): Payer: Self-pay | Admitting: Physician Assistant

## 2018-12-16 DIAGNOSIS — I10 Essential (primary) hypertension: Secondary | ICD-10-CM

## 2018-12-16 DIAGNOSIS — I483 Typical atrial flutter: Secondary | ICD-10-CM

## 2018-12-16 DIAGNOSIS — I471 Supraventricular tachycardia: Secondary | ICD-10-CM

## 2018-12-16 LAB — CULTURE, BLOOD (ROUTINE X 2)
Culture: NO GROWTH
Culture: NO GROWTH
SPECIAL REQUESTS: ADEQUATE
Special Requests: ADEQUATE

## 2018-12-16 LAB — BASIC METABOLIC PANEL
Anion gap: 4 — ABNORMAL LOW (ref 5–15)
BUN: 16 mg/dL (ref 8–23)
CO2: 29 mmol/L (ref 22–32)
Calcium: 8.4 mg/dL — ABNORMAL LOW (ref 8.9–10.3)
Chloride: 111 mmol/L (ref 98–111)
Creatinine, Ser: 0.97 mg/dL (ref 0.61–1.24)
GFR calc Af Amer: >60 mL/min (ref >60)
Glucose, Bld: 97 mg/dL (ref 70–99)
Potassium: 3.9 mmol/L (ref 3.5–5.1)
Sodium: 144 mmol/L (ref 135–145)

## 2018-12-16 LAB — URINALYSIS, ROUTINE W REFLEX MICROSCOPIC
Bilirubin Urine: NEGATIVE
Glucose, UA: NEGATIVE mg/dL
Hgb urine dipstick: NEGATIVE
Ketones, ur: 20 mg/dL — AB
LEUKOCYTE UA: NEGATIVE
Nitrite: NEGATIVE
Protein, ur: NEGATIVE mg/dL
Specific Gravity, Urine: 1.011 (ref 1.005–1.030)
pH: 5 (ref 5.0–8.0)

## 2018-12-16 LAB — MAGNESIUM: Magnesium: 1.6 mg/dL — ABNORMAL LOW (ref 1.7–2.4)

## 2018-12-16 LAB — CBC WITH DIFFERENTIAL/PLATELET
Abs Immature Granulocytes: 0.05 10*3/uL (ref 0.00–0.07)
BASOS PCT: 1 %
Basophils Absolute: 0.1 10*3/uL (ref 0.0–0.1)
Eosinophils Absolute: 0.7 10*3/uL — ABNORMAL HIGH (ref 0.0–0.5)
Eosinophils Relative: 5 %
HCT: 35.3 % — ABNORMAL LOW (ref 39.0–52.0)
Hemoglobin: 11.1 g/dL — ABNORMAL LOW (ref 13.0–17.0)
Immature Granulocytes: 0 %
Lymphocytes Relative: 13 %
Lymphs Abs: 1.8 10*3/uL (ref 0.7–4.0)
MCH: 31.6 pg (ref 26.0–34.0)
MCHC: 31.4 g/dL (ref 30.0–36.0)
MCV: 100.6 fL — ABNORMAL HIGH (ref 80.0–100.0)
Monocytes Absolute: 1.5 10*3/uL — ABNORMAL HIGH (ref 0.1–1.0)
Monocytes Relative: 10 %
Neutro Abs: 10 10*3/uL — ABNORMAL HIGH (ref 1.7–7.7)
Neutrophils Relative %: 71 %
PLATELETS: 387 10*3/uL (ref 150–400)
RBC: 3.51 MIL/uL — AB (ref 4.22–5.81)
RDW: 12.3 % (ref 11.5–15.5)
WBC: 14.1 10*3/uL — ABNORMAL HIGH (ref 4.0–10.5)
nRBC: 0 % (ref 0.0–0.2)

## 2018-12-16 LAB — TYPE AND SCREEN
ABO/RH(D): A POS
Antibody Screen: NEGATIVE

## 2018-12-16 LAB — ABO/RH: ABO/RH(D): A POS

## 2018-12-16 MED ORDER — ENOXAPARIN SODIUM 40 MG/0.4ML ~~LOC~~ SOLN
40.0000 mg | SUBCUTANEOUS | Status: DC
  Administered 2018-12-16: 40 mg via SUBCUTANEOUS
  Filled 2018-12-16: qty 0.4

## 2018-12-16 MED ORDER — LEVALBUTEROL HCL 0.63 MG/3ML IN NEBU
0.6300 mg | INHALATION_SOLUTION | Freq: Four times a day (QID) | RESPIRATORY_TRACT | Status: DC | PRN
  Administered 2018-12-16 – 2018-12-18 (×4): 0.63 mg via RESPIRATORY_TRACT
  Filled 2018-12-16 (×4): qty 3

## 2018-12-16 MED ORDER — METOPROLOL TARTRATE 25 MG PO TABS
25.0000 mg | ORAL_TABLET | Freq: Two times a day (BID) | ORAL | Status: DC
  Administered 2018-12-16 – 2018-12-17 (×3): 25 mg via ORAL
  Filled 2018-12-16 (×3): qty 1

## 2018-12-16 MED ORDER — VANCOMYCIN HCL IN DEXTROSE 1-5 GM/200ML-% IV SOLN: 1000.0000 mg | INTRAVENOUS | Status: DC

## 2018-12-16 MED ORDER — LOSARTAN POTASSIUM 25 MG PO TABS
25.0000 mg | ORAL_TABLET | Freq: Every day | ORAL | Status: DC
  Administered 2018-12-16: 25 mg via ORAL
  Filled 2018-12-16: qty 1

## 2018-12-16 MED ORDER — LEVALBUTEROL HCL 0.63 MG/3ML IN NEBU
INHALATION_SOLUTION | RESPIRATORY_TRACT | Status: AC
  Administered 2018-12-16: 0.63 mg
  Filled 2018-12-16: qty 3

## 2018-12-16 NOTE — Progress Notes (Signed)
PROGRESS NOTE    Matthew Cuevas  ZOX:[id number] DOB: 03-Jan-1941 DOA: 01-08-2019 PCP: System, Pcp Not In   Brief Narrative: As per admitting physician Dr Julian Reil: 78 y.o.malewith medical history significant ofCOPD and HTN who was admitted to [facility] on 11/29/2018 for spontaneous LPTXsecondary to emphysematous bleb rupture. Initially controlled with small bore thoracostomy tube placement. Course complicated by subcutaneous emphysema and persistent airleak. Large bore thoracostomy tube placed with attempted chemical pleurodesis on 12/07/2018. Procedure complicated by significant pain, hypoxemia and respiratory distress. Patient transferred to ICU service requiring O2 delivery by Optiflow to maintain saturations. Follow up imaging showing persistent left pneumothorax with increasing respiratory distress. Small bore thoracostomy tube placed under CT guidance on 12/08/2018 with significant improvement in symptoms. Patient was weaned down to 2L [place] and family has requested transfer to [facility] for continued management.  He arrives to [facility] in stable condition, satting 100% on 2L, RR 20, BP 142/42, thankfully not requiring pressors (he had been requiring levophed earlier in the day prior to transfer apparently).  Patient underwent endobronchial valve placement 3/10, followed by CT surgery.  3/11; Patient still having persistent air leak, CT surgery following.  Subjective: Patient was tachycardic in 140s needing IV Lopressor.  Telemetry reviewed shows sinus tachycardia.   Patient complains of cough and producing phlegm.  Denies chest pain shortness of breath. Denies nausea vomiting.   Assessment & Plan:   Spontaneous pneumothorax on left: Large upper anterior per CT chest 12/10/2018, with soft tissue emphysema: s/p pigtail catheter placement 3/6 with improvement with breath sounds in the left.  Underwent bronchoscopy, endobronchial valve placement 3/10.  Chest x-ray this morning unchanged.  CT  surgery on board, and has persistent air leak.  COPD, not in exacerbation.  Continue bronchodilator, Breo Ellipta, Mucinex, supportive care.  Patient wheezing thought to be from endobronchial valves  HTN (hypertension): Blood pressure 140s to 150s today.  And on losartan 100 mg, HCTZ 25 mg at home and has been on hold. Will resume cozaar at lower dose.  Leukocytosis, likely reactive in the setting of 1.  WBC at 14,100.  Repeat in the morning. Patient is afebrile.  Urine culture showed 30,000 colonies of Enterococcus faecalis.  To be asymptomatic.  Hypernatremia: improved at 144 Hypokalemia: improved at 3.9  Sinus tachycardia:Heart rate of around 145 earlier in am 3/11 improved with Lopressor.  Patient EKG shows sinus rhythm with sinus arrhythmia previously.  I reviewed the telemetry, no obvious atrial fibrillation noted.  I suspect this is sinus tachycardia.  Cont to monitor in telemetry, repeat EKG PRN. He has no history of A. fib.   DVT prophylaxis: SCD Code Status: Code Family Communication: updated wife/family at bedside Disposition Plan: remains inpatient pending clinical improvement.   Consultants:  CTSurgery Dr. Dorris Fetch  Procedures:  CT chest 12/10/2018  Chest x-ray 12/10/2018, 12/12/2018, 12/13/2018, 12/15/2018  Insertion of 14 French pigtail catheter per Dr. Dorris Fetch, CT surgery 12/11/2018  Bronchoscopy with endobronchial valve placement per Dr. Dorris Fetch 12/15/2018  Antimicrobials: Anti-infectives (From admission, onward)   None       Objective: Vitals:   12/15/18 2329 12/16/18 0407 12/16/18 0437 12/16/18 0840  BP: 119/63 (!) 141/70  (!) 151/62  Pulse: 95 90 91 91  Resp: (!) 25  Temp: 98.1 F (36.7 C) 97.9 F (36.6 C)  98.1 F (36.7 C)  TempSrc: Oral Oral  Oral  SpO2: 95% 96%  100%  Weight:      Height:  Intake/Output Summary (Last 24 hours) at 12/16/2018 0902 Last data filed at 12/16/2018 2035 Gross per 24 hour  Intake 1982.57 ml  Output  1305 ml  Net 677.57 ml   Filed Weights   12/07/2018 2300  Weight: 63.5 kg   Weight change:   Body mass index is 20.09 kg/m.  Intake/Output from previous day: 03/10 0701 - 03/11 0700 In: 1982.6 [I.V.:1982.6] Out: 1305 [Urine:925; Chest Tube:380] Intake/Output this shift: No intake/output data recorded.  Examination:  General exam: Appears calm and comfortable, on 2 L nasal cannula, not in distress, older fore the age HEENT:PERRL,Oral mucosa moist, Ear/Nose normal on gross exam Respiratory system: Bilateral equal air entry present, mild wheezing, left anterior chest with tube and left post chest w tube placed in [place]. Cardiovascular system: S1 & S2 heard,No JVD, murmurs. Gastrointestinal system: Abdomen is  soft, non tender, non distended, BS +  Nervous System:Alert and oriented. No focal neurological deficits/moving extremities, sensation intact. Extremities: No edema, no clubbing, distal peripheral pulses palpable. Skin: No rashes, lesions, no icterus MSK: Normal muscle bulk,tone ,power  Medications:  Scheduled Meds: . aspirin EC  81 mg Oral Daily  . fluticasone furoate-vilanterol  1 puff Inhalation Daily  . guaiFENesin  1,200 mg Oral BID  . loratadine  10 mg Oral Daily  . mouth rinse  15 mL Mouth Rinse BID   Continuous Infusions: . sodium chloride 100 mL/hr at 12/16/18 5974    Data Reviewed: I have personally reviewed following labs and imaging studies  CBC: Recent Labs  Lab 12/12/18 0838 12/13/18 0412 12/15/2018 0253 12/15/18 0320 12/16/18 0237  WBC 12.2* 12.0* 11.4* 14.9* 14.1*  NEUTROABS 9.0*  --   --   --  10.0*  HGB 12.0* 10.6* 10.6* 10.8* 11.1*  HCT 38.0* 34.1* 33.3* 33.0* 35.3*  MCV 101.1* 99.7 97.9 98.2 100.6*  PLT 336 332 333 353 387   Basic Metabolic Panel: Recent Labs  Lab 12/12/18 0838 12/13/18 0412 12/21/2018 0253 12/15/18 0320 12/16/18 0237  NA 142 140 140 138 144  K 3.5 3.4* 4.1 4.6 3.9  CL 106 105 108 105 111  CO2 30 28 24 26 29    GLUCOSE 132* 95 87 124* 97  BUN 17 15 16 16 16   CREATININE 1.00 0.85 0.85 0.93 0.97  CALCIUM 8.4* 8.0* 8.1* 8.3* 8.4*  MG  --   --   --   --  1.6*   GFR: Estimated Creatinine Clearance: 58.2 mL/min (by C-G formula based on SCr of 0.97 mg/dL). Liver Function Tests: Recent Labs  Lab 12/10/18 0205  AST 18  ALT 29  ALKPHOS 47  BILITOT 0.6  PROT 6.1*  ALBUMIN 3.4*   No results for input(s): LIPASE, AMYLASE in the last 168 hours. No results for input(s): AMMONIA in the last 168 hours. Coagulation Profile: No results for input(s): INR, PROTIME in the last 168 hours. Cardiac Enzymes: No results for input(s): CKTOTAL, CKMB, CKMBINDEX, TROPONINI in the last 168 hours. BNP (last 3 results) No results for input(s): PROBNP in the last 8760 hours. HbA1C: No results for input(s): HGBA1C in the last 72 hours. CBG: No results for input(s): GLUCAP in the last 168 hours. Lipid Profile: No results for input(s): CHOL, HDL, LDLCALC, TRIG, CHOLHDL, LDLDIRECT in the last 72 hours. Thyroid Function Tests: No results for input(s): TSH, T4TOTAL, FREET4, T3FREE, THYROIDAB in the last 72 hours. Anemia Panel: No results for input(s): VITAMINB12, FOLATE, FERRITIN, TIBC, IRON, RETICCTPCT in the last 72 hours. Sepsis Labs: No results  for input(s): PROCALCITON, LATICACIDVEN in the last 168 hours.  Recent Results (from the past 240 hour(s))  MRSA PCR Screening     Status: None   Collection Time: 12/12/2018 11:26 PM  Result Value Ref Range Status   MRSA by PCR NEGATIVE NEGATIVE Final    Comment:        The GeneXpert MRSA Assay (FDA approved for NASAL specimens only), is one component of a comprehensive MRSA colonization surveillance program. It is not intended to diagnose MRSA infection nor to guide or monitor treatment for MRSA infections. Performed at [facility], [address] [place], [place] [address]   Urine Culture     Status: Abnormal   Collection Time: 12/11/18  8:04 AM   Result Value Ref Range Status   Specimen Description URINE, RANDOM  Final   Special Requests   Final    NONE Performed at [facility], [address], [place], [place] [address]    Culture 30,000 COLONIES/mL ENTEROCOCCUS FAECALIS (A)  Final   Report Status 12/13/2018 FINAL  Final   Organism ID, Bacteria ENTEROCOCCUS FAECALIS (A)  Final      Susceptibility   Enterococcus faecalis - MIC*    AMPICILLIN <=2 SENSITIVE Sensitive     LEVOFLOXACIN 0.5 SENSITIVE Sensitive     NITROFURANTOIN <=16 SENSITIVE Sensitive     VANCOMYCIN 1 SENSITIVE Sensitive     * 30,000 COLONIES/mL ENTEROCOCCUS FAECALIS  Culture, blood (Routine X 2) w Reflex to ID Panel     Status: None   Collection Time: 12/11/18  8:55 AM  Result Value Ref Range Status   Specimen Description BLOOD RIGHT ANTECUBITAL  Final   Special Requests   Final    BOTTLES DRAWN AEROBIC AND ANAEROBIC Blood Culture adequate volume   Culture   Final    NO GROWTH 5 DAYS Performed at [facility], [address] [place], [place] [address]    Report Status 12/16/2018 FINAL  Final  Culture, blood (Routine X 2) w Reflex to ID Panel     Status: None   Collection Time: 12/11/18  8:59 AM  Result Value Ref Range Status   Specimen Description BLOOD LEFT HAND  Final   Special Requests   Final    BOTTLES DRAWN AEROBIC AND ANAEROBIC Blood Culture adequate volume   Culture   Final    NO GROWTH 5 DAYS Performed at [facility], [address] [place], [place] [address]    Report Status 12/16/2018 FINAL  Final      Radiology Studies: Dg Chest Port 1 View  Result Date: 12/16/2018 CLINICAL DATA:  Spontaneous pneumothorax EXAM: PORTABLE CHEST 1 VIEW COMPARISON:  Yesterday FINDINGS: Left chest tube at the apex. Trace left apical pneumothorax. Chronic indistinct opacities at the bases most dense on the right. There are apical emphysematous markings. Endobronchial valves are present on the left. Normal heart size. IMPRESSION: 1. Stable  small left apical pneumothorax. 2. Stable presumed atelectasis at the bases. Electronically Signed   By: Marnee Spring M.D.   On: 12/16/2018 08:48   Dg Chest Port 1 View  Result Date: 12/16/2018 CLINICAL DATA:  Pulling sensation in chest, shortness of breath EXAM: PORTABLE CHEST 1 VIEW COMPARISON:  12/16/2018, 6:25 a.m. FINDINGS: No change in AP portable radiograph with tiny left apical pneumothorax and unchanged position of pigtail chest tubes about the left apex and left base. Left-sided endobronchial valves noted. Subcutaneous emphysema about the left chest wall. IMPRESSION: No  change in AP portable radiograph with tiny left apical pneumothorax and unchanged position of pigtail chest tubes about the left apex and left base. Left-sided endobronchial valves noted. Subcutaneous emphysema about the left chest wall. Electronically Signed   By: Lauralyn Primes M.D.   On: 12/16/2018 08:44   Dg Chest Port 1 View  Result Date: 12/15/2018 CLINICAL DATA:  Follow-up chest tube EXAM: PORTABLE CHEST 1 VIEW COMPARISON:  12/11/2018 FINDINGS: Two left-sided chest tubes are again identified and stable. Left apical pneumothorax is again seen and stable. The right lung remains clear. No bony abnormality is seen. Endobronchial valves are noted. No bony abnormality is seen. IMPRESSION: Chest tubes in place on the left. Endobronchial valves are again seen and stable. Tiny left apical pneumothorax is stable. Electronically Signed   By: Alcide Clever M.D.   On: 12/15/2018 07:19   Dg Chest Port 1 View  Result Date: 01/03/2019 CLINICAL DATA:  Air-leak EXAM: PORTABLE CHEST 1 VIEW COMPARISON:  12/13/2018, 12/13/2018, 12/12/2018, CT 12/10/2018 FINDINGS: Two left-sided chest tubes with inferior tube pigtail at the CP angle, superior chest tube tip at the left apex. No significant change in small residual left apical pneumothorax. Small right-sided pleural effusion and hazy edema or atelectasis at the bases. Similar amount of left  chest wall and axillary subcutaneous emphysema. Stable cardiomediastinal silhouette. Emphysematous disease. Small linear and rectangular opacities project over the left hilar region. Uncertain if these are surgical or external to the patient. IMPRESSION: Overall no significant interval change in small residual left apical pneumothorax or left chest wall emphysema. Small right pleural effusion with hazy atelectasis, edema or infiltrate at the right base. Electronically Signed   By: Jasmine Pang M.D.   On: 01/04/2019 19:44      LOS: 7 days   Time spent: More than 50% of that time was spent in counseling and/or coordination of care.  Lanae Boast, MD [facility]  12/16/2018, 9:02 AM

## 2018-12-16 NOTE — H&P (View-Only) (Signed)
[address]       [name] [address]             [phone]      2 Days Post-Op Procedure(s) (LRB): VIDEO BRONCHOSCOPY (N/A) possible VIDEO BRONCHOSCOPY WITH INSERTION OF INTERBRONCHIAL VALVE (IBV) (N/A)   Subjective:  Patient not feeling as good today.  He is more short of breath.  Developed Atrial Fibrillation with RVR overnight.  Nurse was in room and also states his chest tube got pulled on while getting up to go to the bedside commode.  Objective: Vital signs in last 24 hours: Temp:  [97.9 F (36.6 C)-98.4 F (36.9 C)] 98.1 F (36.7 C) (03/11 0840) Pulse Rate:  [87-104] 91 (03/11 0840) Cardiac Rhythm: Sinus tachycardia (03/11 0752) Resp:  [14-25] 25 (03/11 0840) BP: (105-151)/(45-70) 151/62 (03/11 0840) SpO2:  [94 %-100 %] 100 % (03/11 0840)  Intake/Output from previous day: 03/10 0701 - 03/11 0700 In: 1982.6 [I.V.:1982.6] Out: 1305 [Urine:925; Chest Tube:380]  General appearance: alert, cooperative and no distress Heart: regular rate and rhythm Lungs: clear to auscultation bilaterally Abdomen: soft, non-tender; bowel sounds normal; no masses,  no organomegaly Extremities: extremities normal, atraumatic, no cyanosis or edema Wound: clean and dry  Lab Results: Recent Labs    12/15/18 0320 12/16/18 0237  WBC 14.9* 14.1*  HGB 10.8* 11.1*  HCT 33.0* 35.3*  PLT 353 387   BMET:  Recent Labs    12/15/18 0320 12/16/18 0237  NA 138 144  K 4.6 3.9  CL 105 111  CO2 26 29  GLUCOSE 124* 97  BUN 16 16  CREATININE 0.93 0.97  CALCIUM 8.3* 8.4*    PT/INR: No results for input(s): LABPROT, INR in the last 72 hours. ABG No results found for: PHART, HCO3, TCO2, ACIDBASEDEF, O2SAT CBG (last 3)  No results for input(s): GLUCAP in the last 72 hours.  Assessment/Plan: S/P Procedure(s) (LRB): VIDEO BRONCHOSCOPY (N/A) possible VIDEO BRONCHOSCOPY WITH INSERTION OF INTERBRONCHIAL VALVE (IBV) (N/A)  1. Chest tube- Atrium air leak has  resolved, [name] with + air leak seems a little worse than yesterday  2. Pulm- CXR with increased sub q air on left, initial CXR did not have a pneumothorax, the repeat requested by nurse showed a left lateral pneumothorax, posterior chest tube looks to be part way out of the chest 3. CV- Atrial Fibrillation with RVR in NSR this morning- may benefit from low dose Lopressor, will defer to Primary service 4. Dispo- persistent air leak, A. Fib in RVR, maintaining NSR currently, probable VATS tomorrow if air leak persists   LOS: 7 days    Matthew Cuevas 12/16/2018 Patient seen and examined, agree with above Air leak from basilar tube has resolved. Air leak from apical tube slightly better but still significant. I discussed options of continued waiting v VATS for bleb resection with Matthew Cuevas. I think we need to go ahead with VATS as leak is still too large to think it will resolve short term. May consider bronch to remove some or all IBV depending on intraoperative findings. I discussed the indications, risks, benefits and alternatives with them. They understand the risks include but are not limited to death, MI, DVT, PE, infection, bleeding, possible need for transfusion, air leak, cardiac arrhythmias, as well as other unforeseeable complications with them.   He accepts risks and agrees to proceed.   Will ask Cardiology to see today re: A fib/ flutter  Matthew Spare C. Dorris Fetch, MD  [facility] [phone]

## 2018-12-16 NOTE — Progress Notes (Signed)
Pt assisted up to [facility]. Went into Afib with RVR.  Lopressor 5 mg IVP given slow by Georgina Pillion, RN. Dressing leaking.  Bed saturated.  Dressing taken down and still remains in place.  MD paged and made aware of above.  No air leak noted at this time where had one yesterday.  Will continue to monitor.

## 2018-12-16 NOTE — Progress Notes (Addendum)
    [address]       [place] [address]             [phone]      2 Days Post-Op Procedure(s) (LRB): VIDEO BRONCHOSCOPY (N/A) possible VIDEO BRONCHOSCOPY WITH INSERTION OF INTERBRONCHIAL VALVE (IBV) (N/A)   Subjective:  Patient not feeling as good today.  He is more short of breath.  Developed Atrial Fibrillation with RVR overnight.  Nurse was in room and also states his chest tube got pulled on while getting up to go to the bedside commode.  Objective: Vital signs in last 24 hours: Temp:  [97.9 F (36.6 C)-98.4 F (36.9 C)] 98.1 F (36.7 C) (03/11 0840) Pulse Rate:  [87-104] 91 (03/11 0840) Cardiac Rhythm: Sinus tachycardia (03/11 0752) Resp:  [14-25] 25 (03/11 0840) BP: (105-151)/(45-70) 151/62 (03/11 0840) SpO2:  [94 %-100 %] 100 % (03/11 0840)  Intake/Output from previous day: 03/10 0701 - 03/11 0700 In: 1982.6 [I.V.:1982.6] Out: 1305 [Urine:925; Chest Tube:380]  General appearance: alert, cooperative and no distress Heart: regular rate and rhythm Lungs: clear to auscultation bilaterally Abdomen: soft, non-tender; bowel sounds normal; no masses,  no organomegaly Extremities: extremities normal, atraumatic, no cyanosis or edema Wound: clean and dry  Lab Results: Recent Labs    12/15/18 0320 12/16/18 0237  WBC 14.9* 14.1*  HGB 10.8* 11.1*  HCT 33.0* 35.3*  PLT 353 387   BMET:  Recent Labs    12/15/18 0320 12/16/18 0237  NA 138 144  K 4.6 3.9  CL 105 111  CO2 26 29  GLUCOSE 124* 97  BUN 16 16  CREATININE 0.93 0.97  CALCIUM 8.3* 8.4*    PT/INR: No results for input(s): LABPROT, INR in the last 72 hours. ABG No results found for: PHART, HCO3, TCO2, ACIDBASEDEF, O2SAT CBG (last 3)  No results for input(s): GLUCAP in the last 72 hours.  Assessment/Plan: S/P Procedure(s) (LRB): VIDEO BRONCHOSCOPY (N/A) possible VIDEO BRONCHOSCOPY WITH INSERTION OF INTERBRONCHIAL VALVE (IBV) (N/A)  1. Chest tube- Atrium air leak has  resolved, [name] with + air leak seems a little worse than yesterday  2. Pulm- CXR with increased sub q air on left, initial CXR did not have a pneumothorax, the repeat requested by nurse showed a left lateral pneumothorax, posterior chest tube looks to be part way out of the chest 3. CV- Atrial Fibrillation with RVR in NSR this morning- may benefit from low dose Lopressor, will defer to Primary service 4. Dispo- persistent air leak, A. Fib in RVR, maintaining NSR currently, probable VATS tomorrow if air leak persists   LOS: 7 days    Erin Barrett 12/16/2018 Patient seen and examined, agree with above Air leak from basilar tube has resolved. Air leak from apical tube slightly better but still significant. I discussed options of continued waiting v VATS for bleb resection with Mr and Mrs Trulson. I think we need to go ahead with VATS as leak is still too large to think it will resolve short term. May consider bronch to remove some or all IBV depending on intraoperative findings. I discussed the indications, risks, benefits and alternatives with them. They understand the risks include but are not limited to death, MI, DVT, PE, infection, bleeding, possible need for transfusion, air leak, cardiac arrhythmias, as well as other unforeseeable complications with them.   He accepts risks and agrees to proceed.   Will ask Cardiology to see today re: A fib/ flutter  Maelle Sheaffer C. Tierra Divelbiss, MD   [facility] [phone]

## 2018-12-16 NOTE — Consult Note (Addendum)
Cardiology Consultation:   Patient ID: Matthew Cuevas MRN: [id number]; DOB: [birth year]  Admit date: 01/02/2019 Date of Consult: 12/16/2018  Primary Care Provider: System, Pcp Not In Primary Cardiologist: New - Dr. Allyson Sabal Primary Electrophysiologist:  None    Patient Profile:   Matthew Cuevas is a 78 y.o. male with a hx of HTN and COPD who is being seen today for the evaluation of episodic due to atrial flutter in the setting of spontaneous left pneumothorax at the request of Dr. Dorris Fetch  History of Present Illness:   [name] is a very pleasant 78 year old male with no past cardiac history of however he does have history of hypertension and COPD.  He denies any previous history of palpitation.  He was in his normal state of health until 11/29/2018 when he suddenly noted chest pain and dyspnea while standing in the living room.  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  He was initially treated at [facility] and had thoracostomy tube placement.  Unfortunately he had subcutaneous emphysema and persistent air leak afterward.  A large bore thoracostomy tube was attempted with chemical pleurodesis on 12/07/2018.  Procedure was complicated by significant pain, hypoxemia and respiratory distress.  His symptom improved with placement of small bore thoracostomy tube under CT guidance on 12/08/2018.  He was later transferred to [facility] for further management.  He is currently being seen by cardiothoracic surgery and underwent bronchoscopy with endobronchial valve placement on 12/16/2018. Last chest x-ray obtained today continue to show tiny left apical pneumothorax.  Overnight, patient had recurrent 2-1 atrial flutter versus SVT.  He spontaneously converted out with the help of IV Lopressor.  Due to persistent air leak, the plan is for the patient to undergo VATS procedure for bleb resection tomorrow.  Today during the day,  he had another episode of 2-1 atrial flutter versus SVT.  During the episode, he does have increasing shortness of breath and cardiac awareness of palpitation.  Cardiology was consulted for tachyarrhythmia.  Prior to cardiology evaluation, he has spontaneously come out of the irregular rhythm.   Past Medical History:  Diagnosis Date   COPD (chronic obstructive pulmonary disease) (HCC)    HTN (hypertension)     Past Surgical History:  Procedure Laterality Date   VIDEO BRONCHOSCOPY N/A 12/30/2018   Procedure: VIDEO BRONCHOSCOPY;  Surgeon: [name], MD;  Location: [facility] OR;  Service: Thoracic;  Laterality: N/A;   VIDEO BRONCHOSCOPY WITH INSERTION OF INTERBRONCHIAL VALVE (IBV) N/A 12/30/2018   Procedure: possible VIDEO BRONCHOSCOPY WITH INSERTION OF INTERBRONCHIAL VALVE (IBV);  Surgeon: [name], MD;  Location[facility] OR;  Service: Thoracic;  Laterality: N/A;     Home Medications:  Prior to Admission medications   Medication Sig Start Date End Date Taking? Authorizing Provider  aspirin EC 81 MG tablet Take 81 mg by mouth daily.   Yes [provider]  BREO ELLIPTA 100-25 MCG/INH AEPB Inhale 1 puff into the lungs daily.  10/31/18  Yes [provider]  hydrochlorothiazide (HYDRODIURIL) 25 MG tablet Take 25 mg by mouth daily. 11/03/18  Yes [provider]  losartan (COZAAR) 100 MG tablet Take 100 mg by mouth daily. 11/03/18  Yes [provider]  montelukast (SINGULAIR) 10 MG tablet Take 10 mg by mouth at bedtime. 09/23/18  Yes [provider]  Multiple Vitamin (MULTIVITAMIN WITH MINERALS) TABS tablet Take 1 tablet by mouth daily.   Yes [provider]  Inpatient Medications: Scheduled Meds:  aspirin EC  81 mg Oral Daily   enoxaparin (LOVENOX) injection  40 mg Subcutaneous Q24H   fluticasone furoate-vilanterol  1 puff Inhalation Daily   guaiFENesin  1,200 mg Oral BID   loratadine  10 mg Oral Daily   losartan  25  mg Oral Daily   mouth rinse  15 mL Mouth Rinse BID   Continuous Infusions:  PRN Meds: acetaminophen, levalbuterol, metoprolol tartrate, morphine injection, ondansetron **OR** ondansetron (ZOFRAN) IV, traMADol  Allergies:    Allergies  Allergen Reactions   Doxycycline Shortness Of Breath and Other (See Comments)    Caused respiratory distress when they tried to use it in pleural space for pneumothorax   Penicillins     Did it involve swelling of the face/tongue/throat, SOB, or low BP? Unknown Did it involve sudden or severe rash/hives, skin peeling, or any reaction on the inside of your mouth or nose? Unknown Did you need to seek medical attention at a hospital or doctor's office? Unknown When did it last happen? [place] If all above answers are "NO", may proceed with cephalosporin use.     Social History:   Social History   Socioeconomic History   Marital status: Married    Spouse name: Not on file   Number of children: Not on file   Years of education: Not on file   Highest education level: Not on file  Occupational History   Not on file  Social Needs   Financial resource strain: Not on file   Food insecurity:    Worry: Not on file    Inability: Not on file   Transportation needs:    Medical: Not on file    Non-medical: Not on file  Tobacco Use   Smoking status: Former Smoker    Types: Cigarettes   Smokeless tobacco: Never Used  Substance and Sexual Activity   Alcohol use: Never    Frequency: Never   Drug use: Never   Sexual activity: Not on file  Lifestyle   Physical activity:    Days per week: Not on file    Minutes per session: Not on file   Stress: Not on file  Relationships   Social connections:    Talks on phone: Not on file    Gets together: Not on file    Attends religious service: Not on file    Active member of club or organization: Not on file    Attends meetings of clubs or organizations: Not on file     Relationship status: Not on file   Intimate partner violence:    Fear of current or ex partner: Not on file    Emotionally abused: Not on file    Physically abused: Not on file    Forced sexual activity: Not on file  Other Topics Concern   Not on file  Social History Narrative   Not on file    Family History:   History reviewed. No pertinent family history.   ROS:  Please see the history of present illness.   All other ROS reviewed and negative.     Physical Exam/Data:   Vitals:   12/16/18 0907 12/16/18 1000 12/16/18 1100 12/16/18 1203  BP:  123/65 (!) 137/58 129/62  Pulse: 87 87 88 84  Resp: Temp:    98.1 F (36.7 C)  TempSrc:    Oral  SpO2: 98% 98% 97% 97%  Weight:      Height:  Intake/Output Summary (Last 24 hours) at 12/16/2018 1405 Last data filed at 12/16/2018 1610 Gross per 24 hour  Intake 1982.57 ml  Output 1305 ml  Net 677.57 ml   Last 3 Weights 12/17/2018  Weight (lbs) 139 lb 15.9 oz  Weight (kg) 63.5 kg     Body mass index is 20.09 kg/m.  General:  Well nourished, well developed, in no acute distress HEENT: normal Lymph: no adenopathy Neck: no JVD Endocrine:  No thryomegaly Vascular: No carotid bruits; FA pulses 2+ bilaterally without bruits  Cardiac:  normal S1, S2; RRR; no murmur  Lungs:  Chest tube placed.  Abd: soft, nontender, no hepatomegaly  Ext: no edema Musculoskeletal:  No deformities, BUE and BLE strength normal and equal Skin: warm and dry  Neuro:  CNs 2-12 intact, no focal abnormalities noted Psych:  Normal affect   EKG:  The EKG was personally reviewed and demonstrates: Normal sinus rhythm without significant ST-T wave changes Telemetry:  Telemetry was personally reviewed and demonstrates: Normal sinus rhythm with occasional TTY atrial flutter versus SVT  Relevant CV Studies: N/A  Laboratory Data:  Chemistry Recent Labs  Lab 01/05/2019 0253 12/15/18 0320 12/16/18 0237  NA 140 138 144  K 4.1 4.6 3.9    CL 108 105 111  CO2 GLUCOSE 87 124* 97  BUN CREATININE 0.85 0.93 0.97  CALCIUM 8.1* 8.3* 8.4*  GFRNONAA >60 >60 >60  GFRAA >60 >60 >60  ANIONGAP 8 7 4*    Recent Labs  Lab 12/10/18 0205  PROT 6.1*  ALBUMIN 3.4*  AST 18  ALT 29  ALKPHOS 47  BILITOT 0.6   Hematology Recent Labs  Lab 01/04/2019 0253 12/15/18 0320 12/16/18 0237  WBC 11.4* 14.9* 14.1*  RBC 3.40* 3.36* 3.51*  HGB 10.6* 10.8* 11.1*  HCT 33.3* 33.0* 35.3*  MCV 97.9 98.2 100.6*  MCH 31.2 32.1 31.6  MCHC 31.8 32.7 31.4  RDW 11.9 12.1 12.3  PLT 333 353 387   Cardiac EnzymesNo results for input(s): TROPONINI in the last 168 hours. No results for input(s): TROPIPOC in the last 168 hours.  BNPNo results for input(s): BNP, PROBNP in the last 168 hours.  DDimer No results for input(s): DDIMER in the last 168 hours.  Radiology/Studies:  Dg Chest Port 1 View  Result Date: 12/16/2018 CLINICAL DATA:  Spontaneous pneumothorax EXAM: PORTABLE CHEST 1 VIEW COMPARISON:  Yesterday FINDINGS: Left chest tube at the apex. Trace left apical pneumothorax. Chronic indistinct opacities at the bases most dense on the right. There are apical emphysematous markings. Endobronchial valves are present on the left. Normal heart size. IMPRESSION: 1. Stable small left apical pneumothorax. 2. Stable presumed atelectasis at the bases. Electronically Signed   By: Marnee Spring M.D.   On: 12/16/2018 08:48   Dg Chest Port 1 View  Result Date: 12/16/2018 CLINICAL DATA:  Pulling sensation in chest, shortness of breath EXAM: PORTABLE CHEST 1 VIEW COMPARISON:  12/16/2018, 6:25 a.m. FINDINGS: No change in AP portable radiograph with tiny left apical pneumothorax and unchanged position of pigtail chest tubes about the left apex and left base. Left-sided endobronchial valves noted. Subcutaneous emphysema about the left chest wall. IMPRESSION: No change in AP portable radiograph with tiny left apical pneumothorax and unchanged position  of pigtail chest tubes about the left apex and left base. Left-sided endobronchial valves noted. Subcutaneous emphysema about the left chest wall. Electronically Signed   By: Lauralyn Primes M.D.   On: 12/16/2018  08:44   Dg Chest Port 1 View  Result Date: 12/15/2018 CLINICAL DATA:  Follow-up chest tube EXAM: PORTABLE CHEST 1 VIEW COMPARISON:  Jan 02, 2019 FINDINGS: Two left-sided chest tubes are again identified and stable. Left apical pneumothorax is again seen and stable. The right lung remains clear. No bony abnormality is seen. Endobronchial valves are noted. No bony abnormality is seen. IMPRESSION: Chest tubes in place on the left. Endobronchial valves are again seen and stable. Tiny left apical pneumothorax is stable. Electronically Signed   By: Alcide Clever M.D.   On: 12/15/2018 07:19   Dg Chest Port 1 View  Result Date: Jan 02, 2019 CLINICAL DATA:  Air-leak EXAM: PORTABLE CHEST 1 VIEW COMPARISON:  2019-01-02, 12/13/2018, 12/12/2018, CT 12/10/2018 FINDINGS: Two left-sided chest tubes with inferior tube pigtail at the CP angle, superior chest tube tip at the left apex. No significant change in small residual left apical pneumothorax. Small right-sided pleural effusion and hazy edema or atelectasis at the bases. Similar amount of left chest wall and axillary subcutaneous emphysema. Stable cardiomediastinal silhouette. Emphysematous disease. Small linear and rectangular opacities project over the left hilar region. Uncertain if these are surgical or external to the patient. IMPRESSION: Overall no significant interval change in small residual left apical pneumothorax or left chest wall emphysema. Small right pleural effusion with hazy atelectasis, edema or infiltrate at the right base. Electronically Signed   By: Jasmine Pang M.D.   On: 2019/01/02 19:44   Dg Chest Port 1 View  Result Date: 01/02/2019 CLINICAL DATA:  Pneumothorax. EXAM: PORTABLE CHEST 1 VIEW COMPARISON:  12/13/2018 FINDINGS: Stable position of  left-sided chest tubes. Persistent small left apical pneumothorax. Hyperinflation of the lungs and upper lobe predominant emphysema. Small right pleural effusion. Normal cardiac silhouette. Calcific atherosclerotic disease of the aorta. Osseous structures are without acute abnormality. Left-sided chest wall emphysema. IMPRESSION: 1. Persistent small left apical pneumothorax. 2. Small right pleural effusion. Electronically Signed   By: Ted Mcalpine M.D.   On: 02-Jan-2019 08:52   Dg Chest Port 1 View  Result Date: 12/13/2018 CLINICAL DATA:  Follow-up pneumothorax EXAM: PORTABLE CHEST 1 VIEW COMPARISON:  12/12/2018 FINDINGS: Two chest tubes are again identified and stable. The stable small left apical pneumothorax is again seen. Lungs are hyperinflated. No new focal abnormality is noted. IMPRESSION: No change from the previous day. Electronically Signed   By: Alcide Clever M.D.   On: 12/13/2018 11:39   Dg Chest Port 1 View  Result Date: 12/13/2018 CLINICAL DATA:  Pneumothorax, COPD EXAM: PORTABLE CHEST 1 VIEW COMPARISON:  12/12/2018 FINDINGS: Two chest tubes are noted along the periphery of the left hemithorax 1 projecting up to the third posterior rib level and the second overlying the left costophrenic angle. No significant change in tiny left apical and peripheral pneumothorax superimposed upon emphysematous disease. Heart and mediastinal contours are stable without mediastinal shift. Aortic atherosclerosis is noted. Soft tissue emphysema along the periphery of the left hemithorax is unchanged. IMPRESSION: Stable appearance of the chest status post left-sided chest tube placement. Electronically Signed   By: Tollie Eth M.D.   On: 12/13/2018 00:41    Assessment and Plan:   1. Tachyarrhythmia: Unclear if 2-1 atrial flutter versus SVT.  Reviewed with MD, felt this is more likely to be atrial flutter.  Setting of atrial flutter occurred in the stress from left pneumothorax.  Will not need systemic  anticoagulation.  Will place the patient on a oral regiment of metoprolol tartrate 25 mg twice daily.  He  is cleared to proceed with VATS procedure tomorrow.  We will obtain echocardiogram as well to establish baseline.  -Expect the patient to have more tachycardia palpitation after the surgery, however additional rate control therapy likely will convert him.  2. Left pneumothorax: Occurred in the setting of spontaneous bleb rupture from COPD.  Currently has 2 chest tube on the left side.  Pending VATS procedure with resection tomorrow.  3. COPD: Patient has smoked for 40 to 50 years  4. Hypertension: On losartan, add metoprolol tartrate for better rate control.      For questions or updates, please contact [facility] Please consult [email or web address] for contact info under     Signed, Azalee Course, [place]  12/16/2018 2:05 PM   Agree with note by Azalee Course PA-C  We are asked to see Mr. Arne Cleveland for preoperative clearance prior to VATS by Dr. Dorris Fetch.  He was admitted in transfer from [facility] where he had a spontaneous pneumothorax requiring chest tube insertion.  His chest tube care apparently have leaked and he needs surgical correction.  He does have a long history of tobacco abuse and treated hypertension.  He is never had a heart attack or stroke.  Is not diabetic.  His monitor showed episodes of a flutter with 2 1 block versus PSVT responsive to IV beta-blockers.  He is not had this in the past.  I suspect this is related to his pneumothorax and potential hypoxemia.  I recommend starting him on low-dose oral continuous beta-blockade.  We will check a 2D echo.  Otherwise, he is cleared for his VATS procedure.  Runell Gess, M.D., FACP, [facility], [facility] [address] [place], [place]  [address]  [phone] 12/16/2018 3:16 PM

## 2018-12-17 ENCOUNTER — Inpatient Hospital Stay (HOSPITAL_COMMUNITY): Payer: Medicare Other

## 2018-12-17 ENCOUNTER — Encounter (HOSPITAL_COMMUNITY): Admission: AD | Disposition: E | Payer: Self-pay | Source: Other Acute Inpatient Hospital | Attending: Internal Medicine

## 2018-12-17 ENCOUNTER — Inpatient Hospital Stay (HOSPITAL_COMMUNITY): Payer: Medicare Other | Admitting: Certified Registered"

## 2018-12-17 ENCOUNTER — Encounter (HOSPITAL_COMMUNITY): Payer: Self-pay

## 2018-12-17 DIAGNOSIS — R0600 Dyspnea, unspecified: Secondary | ICD-10-CM

## 2018-12-17 LAB — POCT I-STAT 7, (LYTES, BLD GAS, ICA,H+H)
Acid-Base Excess: 6 mmol/L — ABNORMAL HIGH (ref 0.0–2.0)
Acid-Base Excess: 1 mmol/L (ref 0.0–2.0)
Acid-Base Excess: 2 mmol/L (ref 0.0–2.0)
Bicarbonate: 32 mmol/L — ABNORMAL HIGH (ref 20.0–28.0)
Bicarbonate: 25.9 mmol/L (ref 20.0–28.0)
Bicarbonate: 28.2 mmol/L — ABNORMAL HIGH (ref 20.0–28.0)
Bicarbonate: 26.8 mmol/L (ref 20.0–28.0)
Calcium, Ion: 1.18 mmol/L (ref 1.15–1.40)
Calcium, Ion: 1.18 mmol/L (ref 1.15–1.40)
Calcium, Ion: 1.22 mmol/L (ref 1.15–1.40)
Calcium, Ion: 1.2 mmol/L (ref 1.15–1.40)
HCT: 33 % — ABNORMAL LOW (ref 39.0–52.0)
HCT: 30 % — ABNORMAL LOW (ref 39.0–52.0)
HCT: 30 % — ABNORMAL LOW (ref 39.0–52.0)
HCT: 31 % — ABNORMAL LOW (ref 39.0–52.0)
Hemoglobin: 11.2 g/dL — ABNORMAL LOW (ref 13.0–17.0)
Hemoglobin: 10.2 g/dL — ABNORMAL LOW (ref 13.0–17.0)
Hemoglobin: 10.2 g/dL — ABNORMAL LOW (ref 13.0–17.0)
Hemoglobin: 10.5 g/dL — ABNORMAL LOW (ref 13.0–17.0)
O2 Saturation: 100 %
O2 Saturation: 98 %
O2 Saturation: 95 %
O2 Saturation: 90 %
PH ART: 7.405 (ref 7.350–7.450)
PO2 ART: 65 mmHg — AB (ref 83.0–108.0)
POTASSIUM: 4.2 mmol/L (ref 3.5–5.1)
Patient temperature: 97.8
Patient temperature: 97.6
Potassium: 6.3 mmol/L (ref 3.5–5.1)
Potassium: 4.1 mmol/L (ref 3.5–5.1)
Potassium: 4 mmol/L (ref 3.5–5.1)
SODIUM: 142 mmol/L (ref 135–145)
Sodium: 138 mmol/L (ref 135–145)
Sodium: 143 mmol/L (ref 135–145)
Sodium: 143 mmol/L (ref 135–145)
TCO2: 33 mmol/L — AB (ref 22–32)
TCO2: 27 mmol/L (ref 22–32)
TCO2: 30 mmol/L (ref 22–32)
TCO2: 28 mmol/L (ref 22–32)
pCO2 arterial: 50.5 mmHg — ABNORMAL HIGH (ref 32.0–48.0)
pCO2 arterial: 41.2 mmHg (ref 32.0–48.0)
pCO2 arterial: 49.7 mmHg — ABNORMAL HIGH (ref 32.0–48.0)
pCO2 arterial: 50.9 mmHg — ABNORMAL HIGH (ref 32.0–48.0)
pH, Arterial: 7.409 (ref 7.350–7.450)
pH, Arterial: 7.36 (ref 7.350–7.450)
pH, Arterial: 7.329 — ABNORMAL LOW (ref 7.350–7.450)
pO2, Arterial: 466 mmHg — ABNORMAL HIGH (ref 83.0–108.0)
pO2, Arterial: 95 mmHg (ref 83.0–108.0)
pO2, Arterial: 76 mmHg — ABNORMAL LOW (ref 83.0–108.0)

## 2018-12-17 LAB — BASIC METABOLIC PANEL
Anion gap: 6 (ref 5–15)
BUN: 13 mg/dL (ref 8–23)
CO2: 30 mmol/L (ref 22–32)
Calcium: 8.5 mg/dL — ABNORMAL LOW (ref 8.9–10.3)
Chloride: 109 mmol/L (ref 98–111)
Creatinine, Ser: 0.9 mg/dL (ref 0.61–1.24)
GFR calc Af Amer: >60 mL/min (ref >60)
GFR calc non Af Amer: >60 mL/min (ref >60)
Glucose, Bld: 106 mg/dL — ABNORMAL HIGH (ref 70–99)
Potassium: 4.2 mmol/L (ref 3.5–5.1)
Sodium: 145 mmol/L (ref 135–145)

## 2018-12-17 LAB — GLUCOSE, CAPILLARY
Glucose-Capillary: 93 mg/dL (ref 70–99)
Glucose-Capillary: 77 mg/dL (ref 70–99)

## 2018-12-17 LAB — CBC
HCT: 39.3 % (ref 39.0–52.0)
Hemoglobin: 12.2 g/dL — ABNORMAL LOW (ref 13.0–17.0)
MCH: 31.3 pg (ref 26.0–34.0)
MCHC: 31 g/dL (ref 30.0–36.0)
MCV: 100.8 fL — AB (ref 80.0–100.0)
Platelets: 437 10*3/uL — ABNORMAL HIGH (ref 150–400)
RBC: 3.9 MIL/uL — ABNORMAL LOW (ref 4.22–5.81)
RDW: 12.3 % (ref 11.5–15.5)
WBC: 13.1 10*3/uL — ABNORMAL HIGH (ref 4.0–10.5)
nRBC: 0 % (ref 0.0–0.2)

## 2018-12-17 LAB — ECHOCARDIOGRAM COMPLETE
Height: 70 in
WEIGHTICAEL: 2239.87 [oz_av]

## 2018-12-17 LAB — ECHO INTRAOPERATIVE TEE
HEIGHTINCHES: 70 in
Weight: 2239.87 oz

## 2018-12-17 LAB — PROTIME-INR
INR: 1.1 (ref 0.8–1.2)
Prothrombin Time: 14.1 seconds (ref 11.4–15.2)

## 2018-12-17 SURGERY — CANCELLED PROCEDURE
Anesthesia: General

## 2018-12-17 MED ORDER — SODIUM CHLORIDE 0.9 % IV SOLN
INTRAVENOUS | Status: DC
  Administered 2018-12-17 – 2018-12-18 (×2): via INTRAVENOUS

## 2018-12-17 MED ORDER — VANCOMYCIN HCL IN DEXTROSE 1-5 GM/200ML-% IV SOLN
INTRAVENOUS | Status: AC
  Filled 2018-12-17: qty 200

## 2018-12-17 MED ORDER — 0.9 % SODIUM CHLORIDE (POUR BTL) OPTIME
TOPICAL | Status: DC | PRN
  Administered 2018-12-17: 3000 mL

## 2018-12-17 MED ORDER — SUCCINYLCHOLINE CHLORIDE 20 MG/ML IJ SOLN
INTRAMUSCULAR | Status: DC | PRN
  Administered 2018-12-17: 60 mg via INTRAVENOUS

## 2018-12-17 MED ORDER — ALBUMIN HUMAN 5 % IV SOLN
INTRAVENOUS | Status: DC | PRN
  Administered 2018-12-17 (×2): via INTRAVENOUS

## 2018-12-17 MED ORDER — PHENYLEPHRINE HCL-NACL 10-0.9 MG/250ML-% IV SOLN
0.0000 ug/min | INTRAVENOUS | Status: DC
  Administered 2018-12-17: 35 ug/min via INTRAVENOUS
  Administered 2018-12-18: 20 ug/min via INTRAVENOUS
  Filled 2018-12-17: qty 250

## 2018-12-17 MED ORDER — ROCURONIUM BROMIDE 10 MG/ML (PF) SYRINGE
PREFILLED_SYRINGE | INTRAVENOUS | Status: DC | PRN
  Administered 2018-12-17: 20 mg via INTRAVENOUS

## 2018-12-17 MED ORDER — SUCCINYLCHOLINE CHLORIDE 200 MG/10ML IV SOSY
PREFILLED_SYRINGE | INTRAVENOUS | Status: AC
  Filled 2018-12-17: qty 10

## 2018-12-17 MED ORDER — BUPIVACAINE LIPOSOME 1.3 % IJ SUSP
20.0000 mL | INTRAMUSCULAR | Status: DC
  Filled 2018-12-17: qty 20

## 2018-12-17 MED ORDER — PHENYLEPHRINE HCL 10 MG/ML IJ SOLN
INTRAMUSCULAR | Status: AC
  Filled 2018-12-17: qty 1

## 2018-12-17 MED ORDER — FENTANYL CITRATE (PF) 100 MCG/2ML IJ SOLN
INTRAMUSCULAR | Status: AC
  Filled 2018-12-17: qty 2

## 2018-12-17 MED ORDER — LIDOCAINE 2% (20 MG/ML) 5 ML SYRINGE
INTRAMUSCULAR | Status: AC
  Filled 2018-12-17: qty 5

## 2018-12-17 MED ORDER — DEXMEDETOMIDINE HCL IN NACL 200 MCG/50ML IV SOLN
0.4000 ug/kg/h | INTRAVENOUS | Status: DC
  Administered 2018-12-17: 0.2 ug/kg/h via INTRAVENOUS
  Administered 2018-12-17 – 2018-12-18 (×2): 0.4 ug/kg/h via INTRAVENOUS
  Filled 2018-12-17 (×3): qty 50

## 2018-12-17 MED ORDER — LEVALBUTEROL HCL 0.63 MG/3ML IN NEBU
0.6300 mg | INHALATION_SOLUTION | Freq: Four times a day (QID) | RESPIRATORY_TRACT | Status: DC
  Administered 2018-12-17 – 2018-12-25 (×33): 0.63 mg via RESPIRATORY_TRACT
  Filled 2018-12-17 (×33): qty 3

## 2018-12-17 MED ORDER — EPHEDRINE 5 MG/ML INJ
INTRAVENOUS | Status: AC
  Filled 2018-12-17: qty 10

## 2018-12-17 MED ORDER — BUPIVACAINE HCL (PF) 0.5 % IJ SOLN
INTRAMUSCULAR | Status: AC
  Filled 2018-12-17: qty 30

## 2018-12-17 MED ORDER — SODIUM CHLORIDE 0.9 % IV SOLN
INTRAVENOUS | Status: DC | PRN
  Administered 2018-12-17: 100 ug/min via INTRAVENOUS

## 2018-12-17 MED ORDER — MIDAZOLAM HCL 2 MG/2ML IJ SOLN
INTRAMUSCULAR | Status: AC
  Filled 2018-12-17: qty 2

## 2018-12-17 MED ORDER — EPINEPHRINE PF 1 MG/10ML IJ SOSY
PREFILLED_SYRINGE | INTRAMUSCULAR | Status: AC
  Filled 2018-12-17: qty 20

## 2018-12-17 MED ORDER — VASOPRESSIN 20 UNIT/ML IV SOLN
INTRAVENOUS | Status: AC
  Filled 2018-12-17: qty 1

## 2018-12-17 MED ORDER — EPHEDRINE SULFATE-NACL 50-0.9 MG/10ML-% IV SOSY
PREFILLED_SYRINGE | INTRAVENOUS | Status: DC | PRN
  Administered 2018-12-17 (×2): 10 mg via INTRAVENOUS

## 2018-12-17 MED ORDER — FENTANYL CITRATE (PF) 100 MCG/2ML IJ SOLN
INTRAMUSCULAR | Status: DC | PRN
  Administered 2018-12-17: 50 ug via INTRAVENOUS
  Administered 2018-12-17: 25 ug via INTRAVENOUS

## 2018-12-17 MED ORDER — EPINEPHRINE PF 1 MG/10ML IJ SOSY
PREFILLED_SYRINGE | INTRAMUSCULAR | Status: DC | PRN
  Administered 2018-12-17: 1 mg via INTRAVENOUS

## 2018-12-17 MED ORDER — PROPOFOL 10 MG/ML IV BOLUS
INTRAVENOUS | Status: DC | PRN
  Administered 2018-12-17: 90 mg via INTRAVENOUS

## 2018-12-17 MED ORDER — FENTANYL CITRATE (PF) 250 MCG/5ML IJ SOLN
INTRAMUSCULAR | Status: AC
  Filled 2018-12-17: qty 5

## 2018-12-17 MED ORDER — LACTATED RINGERS IV SOLN
INTRAVENOUS | Status: DC
  Administered 2018-12-17 – 2018-12-18 (×3): via INTRAVENOUS

## 2018-12-17 MED ORDER — PHENYLEPHRINE 40 MCG/ML (10ML) SYRINGE FOR IV PUSH (FOR BLOOD PRESSURE SUPPORT)
PREFILLED_SYRINGE | INTRAVENOUS | Status: DC | PRN
  Administered 2018-12-17: 160 ug via INTRAVENOUS

## 2018-12-17 MED ORDER — PROPOFOL 10 MG/ML IV BOLUS
INTRAVENOUS | Status: AC
  Filled 2018-12-17: qty 20

## 2018-12-17 MED ORDER — MIDAZOLAM HCL 5 MG/5ML IJ SOLN
INTRAMUSCULAR | Status: DC | PRN
  Administered 2018-12-17: 1 mg via INTRAVENOUS

## 2018-12-17 MED ORDER — SODIUM CHLORIDE 0.9 % IV SOLN
INTRAVENOUS | Status: DC | PRN
  Administered 2018-12-17: 100 mL

## 2018-12-17 MED ORDER — EPINEPHRINE PF 1 MG/ML IJ SOLN
INTRAMUSCULAR | Status: AC
  Filled 2018-12-17: qty 1

## 2018-12-17 MED ORDER — PHENYLEPHRINE 40 MCG/ML (10ML) SYRINGE FOR IV PUSH (FOR BLOOD PRESSURE SUPPORT)
PREFILLED_SYRINGE | INTRAVENOUS | Status: AC
  Filled 2018-12-17: qty 10

## 2018-12-17 SURGICAL SUPPLY — 93 items
ADAPTER VALVE BIOPSY EBUS (MISCELLANEOUS) IMPLANT
ADPTR VALVE BIOPSY EBUS (MISCELLANEOUS)
APPLICATOR COTTON TIP 6 STRL (MISCELLANEOUS) IMPLANT
APPLICATOR COTTON TIP 6IN STRL (MISCELLANEOUS) IMPLANT
APPLIER CLIP ROT 10 11.4 M/L (STAPLE)
CANISTER SUCT 3000ML PPV (MISCELLANEOUS) ×6 IMPLANT
CATH THORACIC 28FR RT ANG (CATHETERS) IMPLANT
CATH THORACIC 36FR (CATHETERS) IMPLANT
CATH THORACIC 36FR RT ANG (CATHETERS) IMPLANT
CLIP APPLIE ROT 10 11.4 M/L (STAPLE) IMPLANT
CLIP VESOCCLUDE MED 6/CT (CLIP) IMPLANT
CONN Y 3/8X3/8X3/8  BEN (MISCELLANEOUS)
CONN Y 3/8X3/8X3/8 BEN (MISCELLANEOUS) ×1 IMPLANT
CONT SPEC 4OZ CLIKSEAL STRL BL (MISCELLANEOUS) ×2 IMPLANT
COVER BACK TABLE 60X90IN (DRAPES) ×1 IMPLANT
COVER SURGICAL LIGHT HANDLE (MISCELLANEOUS) ×1 IMPLANT
COVER WAND RF STERILE (DRAPES) ×1 IMPLANT
DERMABOND ADVANCED (GAUZE/BANDAGES/DRESSINGS)
DERMABOND ADVANCED .7 DNX12 (GAUZE/BANDAGES/DRESSINGS) IMPLANT
DRAIN CHANNEL 28F RND 3/8 FF (WOUND CARE) IMPLANT
DRAIN CHANNEL 32F RND 10.7 FF (WOUND CARE) IMPLANT
DRAPE LAPAROSCOPIC ABDOMINAL (DRAPES) ×1 IMPLANT
DRAPE SLUSH/WARMER DISC (DRAPES) ×1 IMPLANT
ELECT REM PT RETURN 9FT ADLT (ELECTROSURGICAL)
ELECT SOLID GEL RDN PRO-PADZ (MISCELLANEOUS) ×5
ELECTRODE REM PT RTRN 9FT ADLT (ELECTROSURGICAL) ×1 IMPLANT
ELECTRODE SOLI GEL RDN PROPADZ (MISCELLANEOUS) ×3 IMPLANT
FILTER STRAW FLUID ASPIR (MISCELLANEOUS) IMPLANT
FORCEPS BIOP RJ4 1.8 (CUTTING FORCEPS) IMPLANT
GAUZE SPONGE 4X4 12PLY STRL (GAUZE/BANDAGES/DRESSINGS) ×1 IMPLANT
GLOVE SURG SIGNA 7.5 PF LTX (GLOVE) ×2 IMPLANT
GOWN STRL REUS W/ TWL LRG LVL3 (GOWN DISPOSABLE) ×2 IMPLANT
GOWN STRL REUS W/ TWL XL LVL3 (GOWN DISPOSABLE) ×1 IMPLANT
GOWN STRL REUS W/TWL LRG LVL3 (GOWN DISPOSABLE)
GOWN STRL REUS W/TWL XL LVL3 (GOWN DISPOSABLE)
HEMOSTAT SURGICEL 2X14 (HEMOSTASIS) IMPLANT
IV CATH 22GX1 FEP (IV SOLUTION) IMPLANT
KIT BASIN OR (CUSTOM PROCEDURE TRAY) ×1 IMPLANT
KIT CLEAN ENDO COMPLIANCE (KITS) ×6 IMPLANT
KIT SUCTION CATH 14FR (SUCTIONS) ×1 IMPLANT
KIT TURNOVER KIT B (KITS) ×1 IMPLANT
MARKER SKIN DUAL TIP RULER LAB (MISCELLANEOUS) ×1 IMPLANT
NDL 18GX1X1/2 (RX/OR ONLY) (NEEDLE) IMPLANT
NDL SPNL 18GX3.5 QUINCKE PK (NEEDLE) IMPLANT
NEEDLE 18GX1X1/2 (RX/OR ONLY) (NEEDLE) IMPLANT
NEEDLE SPNL 18GX3.5 QUINCKE PK (NEEDLE) IMPLANT
NS IRRIG 1000ML POUR BTL (IV SOLUTION) ×2 IMPLANT
OIL SILICONE PENTAX (PARTS (SERVICE/REPAIRS)) IMPLANT
PACK CHEST (CUSTOM PROCEDURE TRAY) ×1 IMPLANT
PAD ARMBOARD 7.5X6 YLW CONV (MISCELLANEOUS) ×7 IMPLANT
POUCH ENDO CATCH II 15MM (MISCELLANEOUS) IMPLANT
POUCH SPECIMEN RETRIEVAL 10MM (ENDOMECHANICALS) IMPLANT
SEALANT PROGEL (MISCELLANEOUS) IMPLANT
SEALANT SURG COSEAL 4ML (VASCULAR PRODUCTS) IMPLANT
SEALANT SURG COSEAL 8ML (VASCULAR PRODUCTS) IMPLANT
SOLUTION ANTI FOG 6CC (MISCELLANEOUS) ×1 IMPLANT
SPECIMEN JAR MEDIUM (MISCELLANEOUS) ×1 IMPLANT
SPONGE INTESTINAL PEANUT (DISPOSABLE) IMPLANT
SPONGE TONSIL TAPE 1 RFD (DISPOSABLE) ×1 IMPLANT
STOPCOCK MORSE 400PSI 3WAY (MISCELLANEOUS) ×1 IMPLANT
SUT PROLENE 4 0 RB 1 (SUTURE)
SUT PROLENE 4-0 RB1 .5 CRCL 36 (SUTURE) IMPLANT
SUT SILK  1 MH (SUTURE)
SUT SILK 1 MH (SUTURE) ×2 IMPLANT
SUT SILK 2 0SH CR/8 30 (SUTURE) IMPLANT
SUT SILK 3 0SH CR/8 30 (SUTURE) IMPLANT
SUT VIC AB 1 CTX 36 (SUTURE)
SUT VIC AB 1 CTX36XBRD ANBCTR (SUTURE) IMPLANT
SUT VIC AB 2-0 CTX 36 (SUTURE) IMPLANT
SUT VIC AB 2-0 UR6 27 (SUTURE) IMPLANT
SUT VIC AB 3-0 MH 27 (SUTURE) IMPLANT
SUT VIC AB 3-0 X1 27 (SUTURE) ×1 IMPLANT
SUT VICRYL 2 TP 1 (SUTURE) IMPLANT
SYR 10ML LL (SYRINGE) ×1 IMPLANT
SYR 20CC LL (SYRINGE) IMPLANT
SYR 20ML ECCENTRIC (SYRINGE) ×1 IMPLANT
SYSTEM SAHARA CHEST DRAIN ATS (WOUND CARE) ×1 IMPLANT
TAPE CLOTH 4X10 WHT NS (GAUZE/BANDAGES/DRESSINGS) ×1 IMPLANT
TIP APPLICATOR SPRAY EXTEND 16 (VASCULAR PRODUCTS) IMPLANT
TOWEL GREEN STERILE (TOWEL DISPOSABLE) ×1 IMPLANT
TOWEL GREEN STERILE FF (TOWEL DISPOSABLE) ×1 IMPLANT
TOWEL NATURAL 4PK STERILE (DISPOSABLE) ×1 IMPLANT
TRAP SPECIMEN MUCOUS 40CC (MISCELLANEOUS) ×1 IMPLANT
TRAY FOLEY MTR SLVR 16FR STAT (SET/KITS/TRAYS/PACK) ×1 IMPLANT
TROCAR XCEL BLADELESS 5X75MML (TROCAR) ×1 IMPLANT
TROCAR XCEL NON-BLD 5MMX100MML (ENDOMECHANICALS) IMPLANT
TUBE CONNECTING 20'X1/4 (TUBING)
TUBE CONNECTING 20X1/4 (TUBING) ×1 IMPLANT
UNDERPAD 30X30 (UNDERPADS AND DIAPERS) ×1 IMPLANT
VALVE BIOPSY  SINGLE USE (MISCELLANEOUS)
VALVE BIOPSY SINGLE USE (MISCELLANEOUS) ×1 IMPLANT
VALVE SUCTION BRONCHIO DISP (MISCELLANEOUS) ×1 IMPLANT
WATER STERILE IRR 1000ML POUR (IV SOLUTION) ×2 IMPLANT

## 2018-12-17 NOTE — Anesthesia Postprocedure Evaluation (Signed)
Anesthesia Post Note  Patient: Matthew Cuevas  Procedure(s) Performed: Cancelled Procedure     Patient location during evaluation: SICU Anesthesia Type: General Level of consciousness: sedated Pain management: pain level controlled Vital Signs Assessment: post-procedure vital signs reviewed and stable Respiratory status: patient remains intubated per anesthesia plan Cardiovascular status: stable Postop Assessment: no apparent nausea or vomiting Anesthetic complications: no    Last Vitals:  Vitals:   12/31/2018 1303 12/28/2018 1444  BP:  (!) 156/65  Pulse: 94 85  Resp: (!) 32 (!) 26  Temp:    SpO2: 95% 97%    Last Pain:  Vitals:   12/07/2018 1131  TempSrc:   PainSc: 3                  Trevor Iha

## 2018-12-17 NOTE — Transfer of Care (Signed)
Immediate Anesthesia Transfer of Care Note  Patient: Matthew Cuevas  Procedure(s) Performed: Cancelled Procedure  Patient Location: SICU  Anesthesia Type:General  Level of Consciousness: Patient remains intubated per anesthesia plan  Airway & Oxygen Therapy: Patient remains intubated per anesthesia plan and Patient placed on Ventilator (see vital sign flow sheet for setting)  Post-op Assessment: Report given to RN and Post -op Vital signs reviewed and stable  Post vital signs: Reviewed and stable  Last Vitals:  Vitals Value Taken Time  BP 133/87 12/31/2018  2:36 PM  Temp    Pulse 93 12/07/2018  2:43 PM  Resp 15 12/28/2018  2:43 PM  SpO2 98 % 12/15/2018  2:43 PM  Vitals shown include unvalidated device data.  Last Pain:  Vitals:   12/12/2018 1131  TempSrc:   PainSc: 3       Patients Stated Pain Goal: 3 (12/12/18 0848)  Complications: No apparent anesthesia complications

## 2018-12-17 NOTE — Interval H&P Note (Signed)
History and Physical Interval Note:  12/21/2018 12:38 PM  Matthew Cuevas  has presented today for surgery, with the diagnosis of LEFT PTX AIRLEAK.  The various methods of treatment have been discussed with the patient and family. After consideration of risks, benefits and other options for treatment, the patient has consented to  Procedure(s): VIDEO BRONCHOSCOPY WITH POSSIBLE REMOVAL OF INTERBRONCHIAL VALVE (IBV) (N/A) VIDEO ASSISTED THORACOSCOPY (Left) STAPLING OF BLEBS (Left) as a surgical intervention.  [name] history has been reviewed, patient examined, no change in status, stable for surgery.  I have reviewed the patient's chart and labs.  Questions were answered to the patient's satisfaction.     [name]

## 2018-12-17 NOTE — Progress Notes (Signed)
RT note: rapid wean protocol initiated at 1455 per MD.

## 2018-12-17 NOTE — Procedures (Signed)
Extubation Procedure Note  Patient Details:   Name: Matthew Cuevas DOB: [birth year] MRN: [id number]   Airway Documentation:    Vent end date: 12/28/2018 Vent end time: 1550   Evaluation  O2 sats: stable throughout Complications: No apparent complications Patient did tolerate procedure well. Bilateral Breath Sounds: Clear, Diminished   Yes   Patient extubated to 6L nasal cannula per rapid wean protocol.  Positive cuff leak noted.  No evidence of stridor.  Patient able to speak post extubation.  Sats currently 92%.  NIF of -28, VC of 838 mL.  Patient tolerated well.    Elyn Peers 12/21/2018, 3:52 PM

## 2018-12-17 NOTE — Progress Notes (Signed)
PROGRESS NOTE    Matthew Cuevas  ZJI:[id number] DOB: June 27, 1941 DOA: 12/13/2018 PCP: System, Pcp Not In   Brief Narrative: As per admitting physician Dr Julian Reil: 78 y.o.malewith medical history significant ofCOPD and HTN who was admitted to [facility] on 11/29/2018 for spontaneous LPTXsecondary to emphysematous bleb rupture. Initially controlled with small bore thoracostomy tube placement. Course complicated by subcutaneous emphysema and persistent airleak. Large bore thoracostomy tube placed with attempted chemical pleurodesis on 12/07/2018. Procedure complicated by significant pain, hypoxemia and respiratory distress. Patient transferred to ICU service requiring O2 delivery by Optiflow to maintain saturations. Follow up imaging showing persistent left pneumothorax with increasing respiratory distress. Small bore thoracostomy tube placed under CT guidance on 12/08/2018 with significant improvement in symptoms. Patient was weaned down to 2L [place] and family has requested transfer to [facility] for continued management. He arrived to [facility] in stable condition, satting 100% on 2L, RR 20, BP 142/42, thankfully not requiring pressors (he had been requiring levophed earlier in the day prior to transfer apparently). Patient underwent endobronchial valve placement 3/10, followed by CT surgery. 3/11; Patient still having persistent air leak, CT surgery following. 3/12- went to OR for VATS- but had PEA after anesthesia induction and remains intubated  Subjective:  Patient on vent brought from PACU. He went for VATS and after anesthesia apparently had PEA after induction.   Assessment & Plan:   Spontaneous pneumothorax on left: Large upper anterior per CT chest 12/10/2018, with soft tissue emphysema: s/p pigtail catheter placement 3/6 with improvement with breath sounds in the left.  Underwent bronchoscopy, endobronchial valve placement 3/10 and w persistent leak- was planned for VATS today- wen to OR but after  anesthesia had PEA after indcution, needeing CPR and epi w ROSC.  TEE was done and no acute abnormalities. He is brought toICU on vent. Discussed w Dr Dorris Fetch, from  CT surgery , consulting ICU. Monitor in tele. Cards on consult too. Suspect ?anesthesia related vs hypovolemia-getting volume resuscitation and neo gtt for supportive care. Cont supportive care and weaning.  COPD, not in exacerbation.  Continue bronchodilator, Breo Ellipta, Mucinex once off vent.  Patient wheezing was thought to be from endobronchial valves  HTN (hypertension): Blood pressure stable. Back o nlow dose losartan, placed on metoprolol (new) At home on  losartan 100 mg, HCTZ 25 mg and on hold.  Leukocytosis, likely reactive in the setting of #1. WBC at 14,100-->13100. Monitor cbc.Patient is afebrile.  Urine culture showed 30,000 colonies of Enterococcus faecalis and is asymptomatic.  Hypernatremia: improved at 145 Hypokalemia: improved at 4.2  SVT vs A flutter- cards on board and placed on Metoprolol, low dose.   Discussed w CT Surgery  DVT prophylaxis: SCD/Lovenox-held for OR. Code Status: Code Family Communication: no family at bedside Disposition Plan: transferred to ICU from PACU   Consultants:  CTSurgery Dr. Dorris Fetch Cardiology  Procedures:  CT chest 12/10/2018  Chest x-ray 12/10/2018, 12/12/2018, 12/13/2018, 12/18/2018  Insertion of 14 French pigtail catheter per Dr. Dorris Fetch, CT surgery 12/11/2018  Bronchoscopy with endobronchial valve placement per Dr. Dorris Fetch 12/15/2018  PEA arrest after anesthesia induction and on vent 01-10-19    Antimicrobials: Anti-infectives (From admission, onward)   Start     Dose/Rate Route Frequency Ordered Stop   01/10/19 1142  vancomycin (VANCOCIN) 1-5 GM/200ML-% IVPB    Note to Pharmacy:  Sandi Raveling   : cabinet override      Jan 10, 2019 1142 January 10, 2019 2344   12/16/18 2200  vancomycin (VANCOCIN) IVPB 1000 mg/200 mL  premix     1,000 mg 200 mL/hr over 60  Minutes Intravenous On call to O.R. 12/16/18 2146 12/27/2018 0559       Objective: Vitals:   12/20/2018 1301 12/23/2018 1302 12/16/2018 1303 12/23/2018 1444  BP:    (!) 156/65  Pulse: 62 (!) 58 94 85  Resp: (!) 24 (!) 36 (!) 32 (!) 26  Temp:      TempSrc:      SpO2: 95% 97% 95% 97%  Weight:      Height:        Intake/Output Summary (Last 24 hours) at 12/28/2018 1445 Last data filed at 12/27/2018 1418 Gross per 24 hour  Intake 500 ml  Output 1330 ml  Net -830 ml   Filed Weights   12/22/2018 2300 12/13/2018 1119  Weight: 63.5 kg 63.5 kg   Weight change:   Body mass index is 20.09 kg/m.  Intake/Output from previous day: 03/11 0701 - 03/12 0700 In: -  Out: 1330 [Urine:1200; Chest Tube:130] Intake/Output this shift: Total I/O In: 500 [IV Piggyback:500] Out: -   Examination:  General exam: on vent- trying to bite tube. Moving extremities  HEENT:Oral mucosa moist, Ear/Nose WNL grossly, dentition normal. Respiratory system: Bilateral air entry + w vent in place, no use of accessory muscle. Cardiovascular system: regular rate and rhythm, S1 & S2 heard, No JVD/murmurs. Gastrointestinal system: Abdomen soft, non-distended, BS +.No hepatosplenomegaly palpable. Nervous System: on vent, follows some commands. moving extremities Extremities: No edema, distal peripheral pulses palpable.  Skin: No rashes,no icterus. MSK: Normal muscle bulk,tone, power Chest tubes in place on ant and left post chest.  Medications:  Scheduled Meds: . aspirin EC  81 mg Oral Daily  . enoxaparin (LOVENOX) injection  40 mg Subcutaneous Q24H  . fluticasone furoate-vilanterol  1 puff Inhalation Daily  . guaiFENesin  1,200 mg Oral BID  . loratadine  10 mg Oral Daily  . losartan  25 mg Oral Daily  . mouth rinse  15 mL Mouth Rinse BID  . metoprolol tartrate  25 mg Oral BID   Continuous Infusions: . lactated ringers 10 mL/hr at 12/09/2018 1137  . vancomycin      Data Reviewed: I have personally reviewed  following labs and imaging studies  CBC: Recent Labs  Lab 12/12/18 0838 12/13/18 0412 12/19/2018 0253 12/15/18 0320 12/16/18 0237 12/20/2018 0227  WBC 12.2* 12.0* 11.4* 14.9* 14.1* 13.1*  NEUTROABS 9.0*  --   --   --  10.0*  --   HGB 12.0* 10.6* 10.6* 10.8* 11.1* 12.2*  HCT 38.0* 34.1* 33.3* 33.0* 35.3* 39.3  MCV 101.1* 99.7 97.9 98.2 100.6* 100.8*  PLT 336 332 333 353 387 437*   Basic Metabolic Panel: Recent Labs  Lab 12/13/18 0412 12/08/2018 0253 12/15/18 0320 12/16/18 0237 12/20/2018 0227  NA 140 140 138 144 145  K 3.4* 4.1 4.6 3.9 4.2  CL 105 108 105 111 109  CO2 GLUCOSE 95 87 124* 97 106*  BUN CREATININE 0.85 0.85 0.93 0.97 0.90  CALCIUM 8.0* 8.1* 8.3* 8.4* 8.5*  MG  --   --   --  1.6*  --    GFR: Estimated Creatinine Clearance: 62.7 mL/min (by C-G formula based on SCr of 0.9 mg/dL). Liver Function Tests: No results for input(s): AST, ALT, ALKPHOS, BILITOT, PROT, ALBUMIN in the last 168 hours. No results for input(s): LIPASE, AMYLASE in the last 168 hours. No results for input(s):  AMMONIA in the last 168 hours. Coagulation Profile: Recent Labs  Lab 12/27/2018 0227  INR 1.1   Cardiac Enzymes: No results for input(s): CKTOTAL, CKMB, CKMBINDEX, TROPONINI in the last 168 hours. BNP (last 3 results) No results for input(s): PROBNP in the last 8760 hours. HbA1C: No results for input(s): HGBA1C in the last 72 hours. CBG: No results for input(s): GLUCAP in the last 168 hours. Lipid Profile: No results for input(s): CHOL, HDL, LDLCALC, TRIG, CHOLHDL, LDLDIRECT in the last 72 hours. Thyroid Function Tests: No results for input(s): TSH, T4TOTAL, FREET4, T3FREE, THYROIDAB in the last 72 hours. Anemia Panel: No results for input(s): VITAMINB12, FOLATE, FERRITIN, TIBC, IRON, RETICCTPCT in the last 72 hours. Sepsis Labs: No results for input(s): PROCALCITON, LATICACIDVEN in the last 168 hours.  Recent Results (from the past 240 hour(s))   MRSA PCR Screening     Status: None   Collection Time: 12/28/2018 11:26 PM  Result Value Ref Range Status   MRSA by PCR NEGATIVE NEGATIVE Final    Comment:        The GeneXpert MRSA Assay (FDA approved for NASAL specimens only), is one component of a comprehensive MRSA colonization surveillance program. It is not intended to diagnose MRSA infection nor to guide or monitor treatment for MRSA infections. Performed at [facility], [address] [place], [place] [address]   Urine Culture     Status: Abnormal   Collection Time: 12/11/18  8:04 AM  Result Value Ref Range Status   Specimen Description URINE, RANDOM  Final   Special Requests   Final    NONE Performed at [facility], [address], [place], [place] [address]    Culture 30,000 COLONIES/mL ENTEROCOCCUS FAECALIS (A)  Final   Report Status 12/13/2018 FINAL  Final   Organism ID, Bacteria ENTEROCOCCUS FAECALIS (A)  Final      Susceptibility   Enterococcus faecalis - MIC*    AMPICILLIN <=2 SENSITIVE Sensitive     LEVOFLOXACIN 0.5 SENSITIVE Sensitive     NITROFURANTOIN <=16 SENSITIVE Sensitive     VANCOMYCIN 1 SENSITIVE Sensitive     * 30,000 COLONIES/mL ENTEROCOCCUS FAECALIS  Culture, blood (Routine X 2) w Reflex to ID Panel     Status: None   Collection Time: 12/11/18  8:55 AM  Result Value Ref Range Status   Specimen Description BLOOD RIGHT ANTECUBITAL  Final   Special Requests   Final    BOTTLES DRAWN AEROBIC AND ANAEROBIC Blood Culture adequate volume   Culture   Final    NO GROWTH 5 DAYS Performed at [facility], [address], [place], [place] [address]    Report Status 12/16/2018 FINAL  Final  Culture, blood (Routine X 2) w Reflex to ID Panel     Status: None   Collection Time: 12/11/18  8:59 AM  Result Value Ref Range Status   Specimen Description BLOOD LEFT HAND  Final   Special Requests   Final    BOTTLES DRAWN AEROBIC AND ANAEROBIC Blood Culture adequate volume   Culture   Final     NO GROWTH 5 DAYS Performed at [facility], [address], [place], [place] [address]    Report Status 12/16/2018 FINAL  Final      Radiology Studies: Dg Chest 2 View  Result Date: 01/04/2019 CLINICAL DATA:  Shortness of breath EXAM: CHEST - 2 VIEW COMPARISON:  Yesterday FINDINGS: Two left-sided catheters in essentially stable position although the  upper catheter. Small left apical pneumothorax without significant change, 2 rib interspaces in height. Trace pleural fluid. Stable chest wall emphysema. COPD. Endobronchial valves on the left. Normal heart size. IMPRESSION: 1. Unchanged small left apical pneumothorax. 2. Small pleural effusions. Electronically Signed   By: Marnee Spring M.D.   On: 01/01/2019 09:47   Dg Chest Port 1 View  Result Date: 12/16/2018 CLINICAL DATA:  Spontaneous pneumothorax EXAM: PORTABLE CHEST 1 VIEW COMPARISON:  Yesterday FINDINGS: Left chest tube at the apex. Trace left apical pneumothorax. Chronic indistinct opacities at the bases most dense on the right. There are apical emphysematous markings. Endobronchial valves are present on the left. Normal heart size. IMPRESSION: 1. Stable small left apical pneumothorax. 2. Stable presumed atelectasis at the bases. Electronically Signed   By: Marnee Spring M.D.   On: 12/16/2018 08:48   Dg Chest Port 1 View  Result Date: 12/16/2018 CLINICAL DATA:  Pulling sensation in chest, shortness of breath EXAM: PORTABLE CHEST 1 VIEW COMPARISON:  12/16/2018, 6:25 a.m. FINDINGS: No change in AP portable radiograph with tiny left apical pneumothorax and unchanged position of pigtail chest tubes about the left apex and left base. Left-sided endobronchial valves noted. Subcutaneous emphysema about the left chest wall. IMPRESSION: No change in AP portable radiograph with tiny left apical pneumothorax and unchanged position of pigtail chest tubes about the left apex and left base. Left-sided endobronchial valves noted. Subcutaneous  emphysema about the left chest wall. Electronically Signed   By: Lauralyn Primes M.D.   On: 12/16/2018 08:44      LOS: 8 days   Time spent: More than 50% of that time was spent in counseling and/or coordination of care.  Lanae Boast, MD [facility]  01/01/2019, 2:45 PM

## 2018-12-17 NOTE — Anesthesia Preprocedure Evaluation (Signed)
Anesthesia Evaluation  Patient identified by MRN, date of birth, ID band Patient awake    Reviewed: Allergy & Precautions, NPO status , Patient's Chart, lab work & pertinent test results  Airway Mallampati: III  TM Distance: >3 FB Neck ROM: Full  Mouth opening: Limited Mouth Opening  Dental no notable dental hx. (+) Teeth Intact, Loose, Dental Advisory Given, Poor Dentition,    Pulmonary COPD, former smoker,    Pulmonary exam normal breath sounds clear to auscultation       Cardiovascular hypertension, Pt. on medications Normal cardiovascular exam Rhythm:Regular Rate:Normal  12/16/2018 Echo  1. The left ventricle has hyperdynamic systolic function, with an ejection fraction of >65%. The cavity size was normal. Left ventricular diastolic function could not be evaluated.  2. The right ventricle has normal systolic function. The cavity was normal. There is no increase in right ventricular wall thickness.  3. Small pericardial effusion.  4. The pericardial effusion is localized near the right ventricle and surrounding the apex.   Neuro/Psych    GI/Hepatic   Endo/Other    Renal/GU      Musculoskeletal   Abdominal   Peds  Hematology   Anesthesia Other Findings   Reproductive/Obstetrics                             Lab Results  Component Value Date   CREATININE 0.90 12/10/2018   BUN 13 12/08/2018   NA 145 12/20/2018   K 4.2 12/14/2018   CL 109 01/05/2019   CO2 30 12/21/2018    Lab Results  Component Value Date   WBC 13.1 (H) 12/16/2018   HGB 12.2 (L) 12/08/2018   HCT 39.3 12/06/2018   MCV 100.8 (H) 12/28/2018   PLT 437 (H) 12/06/2018    Anesthesia Physical Anesthesia Plan  ASA: III  Anesthesia Plan: General   Post-op Pain Management:    Induction: Intravenous  PONV Risk Score and Plan: 3 and Treatment may vary due to age or medical condition, Ondansetron and Dexamethasone   Airway Management Planned: Double Lumen EBT, Video Laryngoscope Planned and Oral ETT  Additional Equipment:   Intra-op Plan:   Post-operative Plan: Extubation in OR  Informed Consent: I have reviewed the patients History and Physical, chart, labs and discussed the procedure including the risks, benefits and alternatives for the proposed anesthesia with the patient or authorized representative who has indicated his/her understanding and acceptance.     Dental advisory given  Plan Discussed with: CRNA and Anesthesiologist  Anesthesia Plan Comments:         Anesthesia Quick Evaluation

## 2018-12-17 NOTE — Anesthesia Procedure Notes (Addendum)
Procedure Name: Intubation Date/Time: 12/30/2018 1:29 PM Performed by: Gaylene Brooks, CRNA Pre-anesthesia Checklist: Patient identified, Emergency Drugs available, Suction available and Patient being monitored Patient Re-evaluated:Patient Re-evaluated prior to induction Oxygen Delivery Method: Circle System Utilized Preoxygenation: Pre-oxygenation with 100% oxygen Induction Type: IV induction Ventilation: Mask ventilation without difficulty Laryngoscope Size: Mac and 4 Grade View: Grade III Tube type: Oral Endobronchial tube: Double lumen EBT and 39 Fr Number of attempts: 1 Airway Equipment and Method: Stylet,  Oral airway and Bougie stylet Placement Confirmation: ETT inserted through vocal cords under direct vision,  positive ETCO2 and breath sounds checked- equal and bilateral Secured at: 27 cm Tube secured with: Tape Dental Injury: Teeth and Oropharynx as per pre-operative assessment  Difficulty Due To: Difficulty was anticipated

## 2018-12-17 NOTE — Progress Notes (Addendum)
  Echocardiogram 2D Echocardiogram has been performed.  Extremely difficult study due to patient respiratory and positioning. Unable to accurately visualize all views due to surgical dressings.   Matthew Cuevas 01-06-2019, 10:37 AM

## 2018-12-17 NOTE — Anesthesia Procedure Notes (Signed)
Arterial Line Insertion Start/End03-23-20 1:00 PM, 2018/12/28 1:05 PM Performed by: Lanell Matar, CRNA, CRNA  Patient location: Pre-op. Preanesthetic checklist: patient identified, IV checked, site marked, risks and benefits discussed, surgical consent, monitors and equipment checked, pre-op evaluation, timeout performed and anesthesia consent Lidocaine 1% used for infiltration Right, radial was placed Catheter size: 20 G Hand hygiene performed  and maximum sterile barriers used   Attempts: 1 Procedure performed without using ultrasound guided technique.

## 2018-12-17 NOTE — Progress Notes (Signed)
  Echocardiogram Echocardiogram Transesophageal has been performed.  [name] 12/06/2018, 3:25 PM

## 2018-12-17 NOTE — Progress Notes (Signed)
      [address]       [place] [address]             [phone]      Called to OR room after patient developed PEA with induction. CPR was performed and epi given Had ROSC before I arrived. Time to ROSC ~5 min CT still in place. Air leak from anterior tube, none from posterior tube- unchanged CXR was unchanged Dr. Chaney Malling performed TEE- no significant wall motion abnormalities Neo infusion started to support BP while volume resuscitation undertaken.  I suspect he was intravascularly dry and lost BP with induction and vasodilatation  I do not think proceeding with single lung ventilation for VATS is wise in the setting and cancelled case. He is now in 2 heart ICU.  Will attempt to wean vent  Viviann Spare C. Dorris Fetch, MD [facility] [phone]

## 2018-12-18 ENCOUNTER — Inpatient Hospital Stay (HOSPITAL_COMMUNITY): Payer: Medicare Other

## 2018-12-18 DIAGNOSIS — I9589 Other hypotension: Secondary | ICD-10-CM

## 2018-12-18 DIAGNOSIS — J9602 Acute respiratory failure with hypercapnia: Secondary | ICD-10-CM

## 2018-12-18 DIAGNOSIS — I483 Typical atrial flutter: Secondary | ICD-10-CM

## 2018-12-18 DIAGNOSIS — J441 Chronic obstructive pulmonary disease with (acute) exacerbation: Secondary | ICD-10-CM

## 2018-12-18 DIAGNOSIS — E440 Moderate protein-calorie malnutrition: Secondary | ICD-10-CM

## 2018-12-18 DIAGNOSIS — E861 Hypovolemia: Secondary | ICD-10-CM

## 2018-12-18 DIAGNOSIS — J939 Pneumothorax, unspecified: Secondary | ICD-10-CM

## 2018-12-18 LAB — POCT I-STAT 7, (LYTES, BLD GAS, ICA,H+H)
Acid-base deficit: 4 mmol/L — ABNORMAL HIGH (ref 0.0–2.0)
Acid-base deficit: 4 mmol/L — ABNORMAL HIGH (ref 0.0–2.0)
Bicarbonate: 27.8 mmol/L (ref 20.0–28.0)
Bicarbonate: 25 mmol/L (ref 20.0–28.0)
Bicarbonate: 24.3 mmol/L (ref 20.0–28.0)
Calcium, Ion: 1.21 mmol/L (ref 1.15–1.40)
Calcium, Ion: 1.24 mmol/L (ref 1.15–1.40)
Calcium, Ion: 1.27 mmol/L (ref 1.15–1.40)
HCT: 31 % — ABNORMAL LOW (ref 39.0–52.0)
HEMATOCRIT: 32 % — AB (ref 39.0–52.0)
HEMATOCRIT: 32 % — AB (ref 39.0–52.0)
Hemoglobin: 10.9 g/dL — ABNORMAL LOW (ref 13.0–17.0)
Hemoglobin: 10.5 g/dL — ABNORMAL LOW (ref 13.0–17.0)
Hemoglobin: 10.9 g/dL — ABNORMAL LOW (ref 13.0–17.0)
O2 SAT: 93 %
O2 Saturation: 94 %
O2 Saturation: 96 %
PH ART: 7.201 — AB (ref 7.350–7.450)
Patient temperature: 98
Patient temperature: 97.2
Potassium: 4.6 mmol/L (ref 3.5–5.1)
Potassium: 4.3 mmol/L (ref 3.5–5.1)
Potassium: 4.5 mmol/L (ref 3.5–5.1)
Sodium: 144 mmol/L (ref 135–145)
Sodium: 142 mmol/L (ref 135–145)
Sodium: 143 mmol/L (ref 135–145)
TCO2: 30 mmol/L (ref 22–32)
TCO2: 27 mmol/L (ref 22–32)
TCO2: 26 mmol/L (ref 22–32)
pCO2 arterial: 58.9 mmHg — ABNORMAL HIGH (ref 32.0–48.0)
pCO2 arterial: 63.6 mmHg — ABNORMAL HIGH (ref 32.0–48.0)
pCO2 arterial: 60.1 mmHg — ABNORMAL HIGH (ref 32.0–48.0)
pH, Arterial: 7.283 — ABNORMAL LOW (ref 7.350–7.450)
pH, Arterial: 7.21 — ABNORMAL LOW (ref 7.350–7.450)
pO2, Arterial: 83 mmHg (ref 83.0–108.0)
pO2, Arterial: 80 mmHg — ABNORMAL LOW (ref 83.0–108.0)
pO2, Arterial: 94 mmHg (ref 83.0–108.0)

## 2018-12-18 LAB — CBC
HCT: 32.7 % — ABNORMAL LOW (ref 39.0–52.0)
Hemoglobin: 9.9 g/dL — ABNORMAL LOW (ref 13.0–17.0)
MCH: 31.1 pg (ref 26.0–34.0)
MCHC: 30.3 g/dL (ref 30.0–36.0)
MCV: 102.8 fL — ABNORMAL HIGH (ref 80.0–100.0)
Platelets: 309 10*3/uL (ref 150–400)
RBC: 3.18 MIL/uL — ABNORMAL LOW (ref 4.22–5.81)
RDW: 12.4 % (ref 11.5–15.5)
WBC: 13.5 10*3/uL — AB (ref 4.0–10.5)
nRBC: 0 % (ref 0.0–0.2)

## 2018-12-18 LAB — GLUCOSE, CAPILLARY
Glucose-Capillary: 110 mg/dL — ABNORMAL HIGH (ref 70–99)
Glucose-Capillary: 114 mg/dL — ABNORMAL HIGH (ref 70–99)
Glucose-Capillary: 90 mg/dL (ref 70–99)
Glucose-Capillary: 95 mg/dL (ref 70–99)

## 2018-12-18 LAB — BLOOD GAS, ARTERIAL
Acid-base deficit: 3.9 mmol/L — ABNORMAL HIGH (ref 0.0–2.0)
Bicarbonate: 20.9 mmol/L (ref 20.0–28.0)
Drawn by: 358491
FIO2: 80
MECHVT: 580 mL
O2 Saturation: 99.3 %
PEEP: 5 cmH2O
PO2 ART: 215 mmHg — AB (ref 83.0–108.0)
Patient temperature: 98.6
RATE: 16 resp/min
pCO2 arterial: 39.6 mmHg (ref 32.0–48.0)
pH, Arterial: 7.342 — ABNORMAL LOW (ref 7.350–7.450)

## 2018-12-18 LAB — BASIC METABOLIC PANEL
Anion gap: 11 (ref 5–15)
BUN: 15 mg/dL (ref 8–23)
CO2: 24 mmol/L (ref 22–32)
Calcium: 8.4 mg/dL — ABNORMAL LOW (ref 8.9–10.3)
Chloride: 109 mmol/L (ref 98–111)
Creatinine, Ser: 0.96 mg/dL (ref 0.61–1.24)
GFR calc Af Amer: >60 mL/min (ref >60)
GLUCOSE: 100 mg/dL — AB (ref 70–99)
Potassium: 4.2 mmol/L (ref 3.5–5.1)
Sodium: 144 mmol/L (ref 135–145)

## 2018-12-18 LAB — PROCALCITONIN: Procalcitonin: 0.29 ng/mL

## 2018-12-18 MED ORDER — MORPHINE SULFATE (PF) 2 MG/ML IV SOLN
INTRAVENOUS | Status: AC
  Filled 2018-12-18: qty 1

## 2018-12-18 MED ORDER — PHENYLEPHRINE HCL-NACL 10-0.9 MG/250ML-% IV SOLN
0.0000 ug/min | INTRAVENOUS | Status: DC
  Administered 2018-12-18: 20 ug/min via INTRAVENOUS
  Administered 2018-12-18: 30 ug/min via INTRAVENOUS
  Administered 2018-12-19: 20 ug/min via INTRAVENOUS
  Administered 2018-12-20: 25 ug/min via INTRAVENOUS
  Administered 2018-12-20: 27 ug/min via INTRAVENOUS
  Administered 2018-12-20: 25 ug/min via INTRAVENOUS
  Administered 2018-12-21 (×2): 30 ug/min via INTRAVENOUS
  Administered 2018-12-22 (×3): 35 ug/min via INTRAVENOUS
  Administered 2018-12-22: 25 ug/min via INTRAVENOUS
  Administered 2018-12-23 (×2): 35 ug/min via INTRAVENOUS
  Administered 2018-12-23: 45 ug/min via INTRAVENOUS
  Administered 2018-12-23: 40 ug/min via INTRAVENOUS
  Administered 2018-12-23 – 2018-12-24 (×4): 50 ug/min via INTRAVENOUS
  Administered 2018-12-24 – 2018-12-25 (×3): 70 ug/min via INTRAVENOUS
  Administered 2018-12-25: 14:00:00 via INTRAVENOUS
  Administered 2018-12-25: 40 ug/min via INTRAVENOUS
  Administered 2018-12-25 (×2): 70 ug/min via INTRAVENOUS
  Administered 2018-12-25: 80 ug/min via INTRAVENOUS
  Administered 2018-12-25: 200 ug/min via INTRAVENOUS
  Administered 2018-12-26: 100 ug/min via INTRAVENOUS
  Filled 2018-12-18 (×34): qty 250

## 2018-12-18 MED ORDER — ETOMIDATE 2 MG/ML IV SOLN
10.0000 mg | Freq: Once | INTRAVENOUS | Status: AC
  Administered 2018-12-18: 10 mg via INTRAVENOUS

## 2018-12-18 MED ORDER — FENTANYL CITRATE (PF) 100 MCG/2ML IJ SOLN
50.0000 ug | INTRAMUSCULAR | Status: DC | PRN
  Administered 2018-12-21 – 2018-12-23 (×5): 50 ug via INTRAVENOUS

## 2018-12-18 MED ORDER — CHLORHEXIDINE GLUCONATE 0.12% ORAL RINSE (MEDLINE KIT)
15.0000 mL | Freq: Two times a day (BID) | OROMUCOSAL | Status: DC
  Administered 2018-12-18: 15 mL via OROMUCOSAL

## 2018-12-18 MED ORDER — FENTANYL 2500MCG IN NS 250ML (10MCG/ML) PREMIX INFUSION
0.0000 ug/h | INTRAVENOUS | Status: DC
  Administered 2018-12-18 – 2018-12-19 (×2): 50 ug/h via INTRAVENOUS
  Administered 2018-12-20 – 2018-12-21 (×2): 175 ug/h via INTRAVENOUS
  Administered 2018-12-21: 125 ug/h via INTRAVENOUS
  Administered 2018-12-22: 175 ug/h via INTRAVENOUS
  Administered 2018-12-22: 200 ug/h via INTRAVENOUS
  Administered 2018-12-23: 100 ug/h via INTRAVENOUS
  Administered 2018-12-23: 225 ug/h via INTRAVENOUS
  Administered 2018-12-24: 110 ug/h via INTRAVENOUS
  Administered 2018-12-25 (×2): 200 ug/h via INTRAVENOUS
  Administered 2018-12-26: 300 ug/h via INTRAVENOUS
  Administered 2018-12-26: 250 ug/h via INTRAVENOUS
  Administered 2018-12-27 – 2018-12-28 (×4): 200 ug/h via INTRAVENOUS
  Administered 2018-12-29: 225 ug/h via INTRAVENOUS
  Filled 2018-12-18 (×19): qty 250

## 2018-12-18 MED ORDER — ORAL CARE MOUTH RINSE
15.0000 mL | OROMUCOSAL | Status: DC
  Administered 2018-12-18 – 2018-12-29 (×98): 15 mL via OROMUCOSAL

## 2018-12-18 MED ORDER — MORPHINE SULFATE (PF) 2 MG/ML IV SOLN
1.0000 mg | Freq: Once | INTRAVENOUS | Status: AC
  Administered 2018-12-18: 1 mg via INTRAVENOUS

## 2018-12-18 MED ORDER — MIDAZOLAM HCL 2 MG/2ML IJ SOLN
INTRAMUSCULAR | Status: AC
  Administered 2018-12-18: 2 mg
  Filled 2018-12-18: qty 4

## 2018-12-18 MED ORDER — FENTANYL CITRATE (PF) 100 MCG/2ML IJ SOLN
100.0000 ug | Freq: Once | INTRAMUSCULAR | Status: AC
  Administered 2018-12-18: 50 ug via INTRAVENOUS

## 2018-12-18 MED ORDER — SODIUM CHLORIDE 0.9 % IV SOLN
2.0000 g | Freq: Two times a day (BID) | INTRAVENOUS | Status: DC
  Administered 2018-12-18 – 2018-12-20 (×4): 2 g via INTRAVENOUS
  Filled 2018-12-18 (×5): qty 2

## 2018-12-18 MED ORDER — METHYLPREDNISOLONE SODIUM SUCC 40 MG IJ SOLR
40.0000 mg | Freq: Three times a day (TID) | INTRAMUSCULAR | Status: DC
  Administered 2018-12-18: 40 mg via INTRAVENOUS
  Filled 2018-12-18: qty 1

## 2018-12-18 MED ORDER — ARFORMOTEROL TARTRATE 15 MCG/2ML IN NEBU
15.0000 ug | INHALATION_SOLUTION | Freq: Two times a day (BID) | RESPIRATORY_TRACT | Status: DC
  Administered 2018-12-18 – 2018-12-28 (×21): 15 ug via RESPIRATORY_TRACT
  Filled 2018-12-18 (×22): qty 2

## 2018-12-18 MED ORDER — MIDAZOLAM HCL 2 MG/2ML IJ SOLN: 4.0000 mg | Freq: Once | INTRAMUSCULAR | Status: AC

## 2018-12-18 MED ORDER — MORPHINE BOLUS VIA INFUSION
1.0000 mg | INTRAVENOUS | Status: DC | PRN
  Filled 2018-12-18: qty 1

## 2018-12-18 MED ORDER — DEXMEDETOMIDINE HCL IN NACL 200 MCG/50ML IV SOLN
0.4000 ug/kg/h | INTRAVENOUS | Status: DC
  Administered 2018-12-18 (×2): 1.7 ug/kg/h via INTRAVENOUS
  Filled 2018-12-18: qty 50

## 2018-12-18 MED ORDER — ONDANSETRON HCL 4 MG PO TABS
4.0000 mg | ORAL_TABLET | Freq: Four times a day (QID) | ORAL | Status: DC | PRN
  Filled 2018-12-18: qty 1

## 2018-12-18 MED ORDER — SODIUM CHLORIDE 0.9 % IV SOLN: INTRAVENOUS | Status: DC | PRN

## 2018-12-18 MED ORDER — ONDANSETRON 4 MG PO TBDP: 4.0000 mg | ORAL_TABLET | Freq: Four times a day (QID) | ORAL | Status: DC | PRN

## 2018-12-18 MED ORDER — CHLORHEXIDINE GLUCONATE 0.12% ORAL RINSE (MEDLINE KIT)
15.0000 mL | Freq: Two times a day (BID) | OROMUCOSAL | Status: DC
  Administered 2018-12-18 – 2018-12-28 (×21): 15 mL via OROMUCOSAL

## 2018-12-18 MED ORDER — ONDANSETRON HCL 4 MG/2ML IJ SOLN: 4.0000 mg | Freq: Four times a day (QID) | INTRAMUSCULAR | Status: DC | PRN

## 2018-12-18 MED ORDER — ACETAMINOPHEN 650 MG RE SUPP: 650.0000 mg | Freq: Four times a day (QID) | RECTAL | Status: DC | PRN

## 2018-12-18 MED ORDER — METHYLPREDNISOLONE SODIUM SUCC 40 MG IJ SOLR
40.0000 mg | Freq: Two times a day (BID) | INTRAMUSCULAR | Status: DC
  Administered 2018-12-18 – 2018-12-19 (×3): 40 mg via INTRAVENOUS
  Filled 2018-12-18 (×4): qty 1

## 2018-12-18 MED ORDER — METOPROLOL TARTRATE 25 MG PO TABS
25.0000 mg | ORAL_TABLET | Freq: Two times a day (BID) | ORAL | Status: DC
  Administered 2018-12-19 – 2018-12-20 (×2): 25 mg
  Administered 2018-12-21: 12.5 mg
  Filled 2018-12-18 (×3): qty 1

## 2018-12-18 MED ORDER — ASPIRIN 81 MG PO CHEW
81.0000 mg | CHEWABLE_TABLET | Freq: Every day | ORAL | Status: DC
  Administered 2018-12-19 – 2018-12-28 (×10): 81 mg
  Filled 2018-12-18 (×10): qty 1

## 2018-12-18 MED ORDER — ALBUMIN HUMAN 5 % IV SOLN
25.0000 g | Freq: Once | INTRAVENOUS | Status: AC
  Administered 2018-12-18: 25 g via INTRAVENOUS
  Filled 2018-12-18: qty 250

## 2018-12-18 MED ORDER — ACETAMINOPHEN 325 MG PO TABS: 650.0000 mg | ORAL_TABLET | Freq: Four times a day (QID) | ORAL | Status: DC | PRN

## 2018-12-18 MED ORDER — BIOTENE DRY MOUTH MT LIQD: 15.0000 mL | OROMUCOSAL | Status: DC | PRN

## 2018-12-18 MED ORDER — BUDESONIDE 0.5 MG/2ML IN SUSP
0.5000 mg | Freq: Two times a day (BID) | RESPIRATORY_TRACT | Status: DC
  Administered 2018-12-18 – 2018-12-28 (×21): 0.5 mg via RESPIRATORY_TRACT
  Filled 2018-12-18 (×22): qty 2

## 2018-12-18 MED ORDER — GLYCOPYRROLATE 0.2 MG/ML IJ SOLN: 0.2000 mg | INTRAMUSCULAR | Status: DC | PRN

## 2018-12-18 MED ORDER — MIDAZOLAM HCL 2 MG/2ML IJ SOLN
2.0000 mg | INTRAMUSCULAR | Status: DC | PRN
  Filled 2018-12-18 (×2): qty 2

## 2018-12-18 MED ORDER — HALOPERIDOL 0.5 MG PO TABS
0.5000 mg | ORAL_TABLET | ORAL | Status: DC | PRN
  Filled 2018-12-18: qty 1

## 2018-12-18 MED ORDER — MORPHINE 100MG IN NS 100ML (1MG/ML) PREMIX INFUSION
0.0000 mg/h | INTRAVENOUS | Status: DC
  Filled 2018-12-18: qty 100

## 2018-12-18 MED ORDER — HALOPERIDOL LACTATE 5 MG/ML IJ SOLN: 0.5000 mg | INTRAMUSCULAR | Status: DC | PRN

## 2018-12-18 MED ORDER — FENTANYL CITRATE (PF) 100 MCG/2ML IJ SOLN: 50.0000 ug | INTRAMUSCULAR | Status: DC | PRN

## 2018-12-18 MED ORDER — POLYVINYL ALCOHOL 1.4 % OP SOLN
1.0000 [drp] | Freq: Four times a day (QID) | OPHTHALMIC | Status: DC | PRN
  Filled 2018-12-18: qty 15

## 2018-12-18 MED ORDER — ORAL CARE MOUTH RINSE
15.0000 mL | OROMUCOSAL | Status: DC
  Administered 2018-12-18 (×3): 15 mL via OROMUCOSAL

## 2018-12-18 MED ORDER — SODIUM CHLORIDE 0.9 % IV SOLN
Freq: Once | INTRAVENOUS | Status: AC
  Administered 2018-12-18: 12:00:00 via INTRAVENOUS

## 2018-12-18 MED ORDER — METOPROLOL TARTRATE 25 MG PO TABS: 25.0000 mg | ORAL_TABLET | Freq: Two times a day (BID) | ORAL | Status: DC

## 2018-12-18 MED ORDER — HALOPERIDOL LACTATE 2 MG/ML PO CONC
0.5000 mg | ORAL | Status: DC | PRN
  Filled 2018-12-18: qty 0.3

## 2018-12-18 MED ORDER — MIDAZOLAM HCL (PF) 5 MG/ML IJ SOLN: 4.0000 mg | Freq: Once | INTRAMUSCULAR | Status: DC

## 2018-12-18 MED ORDER — ASPIRIN EC 81 MG PO TBEC: 81.0000 mg | DELAYED_RELEASE_TABLET | Freq: Every day | ORAL | Status: DC

## 2018-12-18 MED ORDER — ORAL CARE MOUTH RINSE: 15.0000 mL | Freq: Two times a day (BID) | OROMUCOSAL | Status: DC

## 2018-12-18 MED ORDER — DEXMEDETOMIDINE HCL IN NACL 400 MCG/100ML IV SOLN
0.0000 ug/kg/h | INTRAVENOUS | Status: DC
  Administered 2018-12-18: 0.4 ug/kg/h via INTRAVENOUS
  Administered 2018-12-19: 0.2 ug/kg/h via INTRAVENOUS
  Filled 2018-12-18: qty 100

## 2018-12-18 MED ORDER — GLYCOPYRROLATE 1 MG PO TABS
1.0000 mg | ORAL_TABLET | ORAL | Status: DC | PRN
  Filled 2018-12-18: qty 1

## 2018-12-18 MED ORDER — FAMOTIDINE 20 MG IN NS 100 ML IVPB
20.0000 mg | Freq: Two times a day (BID) | INTRAVENOUS | Status: DC
  Administered 2018-12-18 – 2018-12-21 (×8): 20 mg via INTRAVENOUS
  Filled 2018-12-18 (×9): qty 100

## 2018-12-18 MED ORDER — MIDAZOLAM HCL 2 MG/2ML IJ SOLN
2.0000 mg | INTRAMUSCULAR | Status: AC | PRN
  Administered 2018-12-19 – 2018-12-20 (×3): 2 mg via INTRAVENOUS
  Filled 2018-12-18 (×2): qty 2

## 2018-12-18 MED ORDER — MONTELUKAST SODIUM 10 MG PO TABS
10.0000 mg | ORAL_TABLET | Freq: Every day | ORAL | Status: DC
  Administered 2018-12-18 – 2018-12-21 (×4): 10 mg via ORAL
  Filled 2018-12-18 (×4): qty 1

## 2018-12-18 MED ORDER — FENTANYL CITRATE (PF) 100 MCG/2ML IJ SOLN
INTRAMUSCULAR | Status: AC
  Administered 2018-12-18: 50 ug via INTRAVENOUS
  Filled 2018-12-18: qty 2

## 2018-12-18 NOTE — Progress Notes (Addendum)
PROGRESS NOTE    Matthew Cuevas  UJW:[id number] DOB: [birth year] DOA: 12/20/2018 PCP: System, Pcp Not In   Brief Narrative: As per admitting physician Dr Julian Reil: 77 y.o.malewith medical history significant ofCOPD and HTN who was admitted to [facility] on 11/29/2018 for spontaneous LPTXsecondary to emphysematous bleb rupture. Initially controlled with small bore thoracostomy tube placement. Course complicated by subcutaneous emphysema and persistent airleak. Large bore thoracostomy tube placed with attempted chemical pleurodesis on 12/07/2018. Procedure complicated by significant pain, hypoxemia and respiratory distress.Patient transferred to ICU service requiring O2 delivery by [employer] to maintain saturations. Follow up imaging showing persistent left pneumothorax with increasing respiratory distress. Small bore thoracostomy tube placed under CT guidance on 12/08/2018 with significant improvement in symptoms. Patient was weaned down to 2L [place] and family has requested transfer to [facility] for continued management. He arrived to [facility] satting 100% on 2L, RR 20, BP 142/42, thankfully not requiring pressors (he had been requiring levophed earlier in the day prior to transfer apparently).  Patient underwent endobronchial valve placement 3/10, followed by CT surgery.00 3/11:Patient still having persistent air leak, CT surgery following. 3/12- went to OR for VATS- but had PEA arrest after anesthesia induction was extubated in evening 3/13- overnight short of breath, placed on bipap. 3/13; worsening shortness of breath, DNR and comfort measure initiated   Subjective:  Patient in respiratory distress this am, overnight was placed on BIPAP, seen by critical care. Critical care discussed w patient and wife, patient wished to be DNR not to be intubated and no BIPAP.After discussion w patient and wife patient was converted to DNR Comfort care by ICU this am.  Assessment & Plan:   Spontaneous  pneumothorax on left: Large upper anterior per CT chest 12/10/2018, with soft tissue emphysema, respiratory failure acute on chronic: s/p pigtail catheter placement 3/6 with improvement with BS in the left. Underwent bronchoscopy, endobronchial valve placement 3/10 and w persistent leak-was planned for VATS 3/12 taken to OR but after anesthesia had PEA arrest after indcution, needeing CPR and epi w ROSC. He was transferred to ICU on vent and extubated-overnight was short of breath, placed on bipap.This am worsening respiratory distress. CXR "Slight progression of left apical pneumothorax which remains small. Progression of bibasilar airspace disease, concerning for pneumonia". Patient did not want bipap and critical discussed about intubation and patient has elected no bipap or intubation understanding that he may pass away.He has been converted to comfort care.   PEA arrest after anesthesia induction: needing CPR and epi w ROSC.Marland KitchenSuspect ?anesthesia related vs hypovolemia. Intra op TEE was done and no acute abnormalities as per Dr Shelly Rubenstein.   Respiratory failure with hypoxia and hypercapnea acute, failed bipap: from #1, in the setting of COPD, Cont supplemental o2 as above. Pt is DNR   COPD w possible acute exacerbation: was placed on steroid overnight and bronchodilators.   HTN (hypertension): Blood pressure stable. Hold meds  Leukocytosis, likely reactive in the setting of #1. WBC at 14,100-->13100.Patient is afebrile.  Urine culture showed 30,000 colonies of Enterococcus faecalis and is asymptomatic.  Hypernatremia/Hypokalemia:level better.  SVT vs A flutter:HR stable  Goals of care: patient with respiratory distress and ICU service has converted to DNR comfort care after discussing with patient and family.  Prognosis is grim, patient having agonal breathing. Discussed w critical care this am multiple times. Initially was being switched to critical care service but after comfort measure  initiation patient will be under hospitalist service.  Addendum  10:29 am: I was  called by ICU team/Rahul: shortly after patient has been converted to FULL CODE and is being transferred under ICU service. patient only received morphine 1 mg x1 iv this am. Continue plan of care as per ICU team and CTVS, [facility] will sign off   DVT prophylaxis:SCD Code Status:DNR comfort Family Communication: Wife at bedside Disposition Plan: per ICU  Consultants:  CTSurgery Dr. Dorris Fetch Cardiology PCCM  Procedures:  CT chest 12/10/2018  Chest x-ray 12/10/2018, 12/12/2018, 12/13/2018, 01-07-2019  Insertion of 14 French pigtail catheter per Dr. Dorris Fetch, CT surgery 12/11/2018  Bronchoscopy with endobronchial valve placement per Dr. Dorris Fetch 12/15/2018  PEA arrest after anesthesia induction and on vent 01/05/2019  Extubated from vent 3/12    Antimicrobials: Anti-infectives (From admission, onward)   Start     Dose/Rate Route Frequency Ordered Stop   12/16/2018 1142  vancomycin (VANCOCIN) 1-5 GM/200ML-% IVPB    Note to Pharmacy:  Sandi Raveling   : cabinet override      12/07/2018 1142 12/23/2018 2344   12/16/18 2200  vancomycin (VANCOCIN) IVPB 1000 mg/200 mL premix  Status:  Discontinued     1,000 mg 200 mL/hr over 60 Minutes Intravenous On call to O.R. 12/16/18 2146 12/10/2018 0559       Objective: Vitals:   12/18/18 0600 12/18/18 0636 12/18/18 0748 12/18/18 0805  BP: 123/85 111/69    Pulse: (!) 132 77    Resp: (!) 27 (!) 24    Temp:   (!) 97.2 F (36.2 C)   TempSrc:   Axillary   SpO2: (!) 88% 96%  98%  Weight: 73.4 kg     Height:        Intake/Output Summary (Last 24 hours) at 12/18/2018 0921 Last data filed at 12/18/2018 0707 Gross per 24 hour  Intake 2302.34 ml  Output 803 ml  Net 1499.34 ml   Filed Weights   01/05/2019 2300 12/12/2018 1119 12/18/18 0600  Weight: 63.5 kg 63.5 kg 73.4 kg   Weight change:   Body mass index is 23.22 kg/m.  Intake/Output from previous  day: 03/12 0701 - 03/13 0700 In: 2204.5 [I.V.:1704.5; IV Piggyback:500] Out: 803 [Urine:673; Chest Tube:130] Intake/Output this shift: Total I/O In: 97.8 [I.V.:97.8] Out: -   Examination:  General exam: agonal breathing, minimally responsive HEENT:Oral mucosa moist, Ear/Nose WNL grossly, dentition normal. Respiratory system: Bilateral diminished BS, s/c emphysema Cardiovascular system: regular rate and rhythm, S1 & S2 heard, No JVD/murmurs. Gastrointestinal system: Abdomen soft, non-distended, BS +. Nervous System: minimally responsive.  Extremities: No edema, distal peripheral pulses palpable.  Skin: No rashes,no icterus. MSK: Normal muscle bulk,tone, power Chest tubes+  Medications:  Scheduled Meds:  levalbuterol  0.63 mg Nebulization Q6H   mouth rinse  15 mL Mouth Rinse BID   metoprolol tartrate  25 mg Oral BID   morphine       Continuous Infusions:  morphine      Data Reviewed: I have personally reviewed following labs and imaging studies  CBC: Recent Labs  Lab 12/12/18 0838  01-07-19 0253 12/15/18 0320 12/16/18 0237 12/12/2018 0227  12/28/2018 1539 01/04/2019 1733 12/18/18 0212 12/18/18 0426 12/18/18 0632  WBC 12.2*   < > 11.4* 14.9* 14.1* 13.1*  --   --   --   --  13.5*  --   NEUTROABS 9.0*  --   --   --  10.0*  --   --   --   --   --   --   --   HGB 12.0*   < >  10.6* 10.8* 11.1* 12.2*   < > 10.2* 10.5* 10.9* 9.9* 10.5*  HCT 38.0*   < > 33.3* 33.0* 35.3* 39.3   < > 30.0* 31.0* 32.0* 32.7* 31.0*  MCV 101.1*   < > 97.9 98.2 100.6* 100.8*  --   --   --   --  102.8*  --   PLT 336   < > 333 353 387 437*  --   --   --   --  309  --    < > = values in this interval not displayed.   Basic Metabolic Panel: Recent Labs  Lab 01/02/2019 0253 12/15/18 0320 12/16/18 0237 12/10/2018 0227  12/21/2018 1539 12/13/2018 1733 12/18/18 0212 12/18/18 0426 12/18/18 0632  NA 140 138 144 145   < > 143 143 144 144 142  K 4.1 4.6 3.9 4.2   < > 4.1 4.0 4.6 4.2 4.3  CL 108 105  111 109  --   --   --   --  109  --   CO2 24 26 29 30   --   --   --   --  24  --   GLUCOSE 87 124* 97 106*  --   --   --   --  100*  --   BUN 16 16 16 13   --   --   --   --  15  --   CREATININE 0.85 0.93 0.97 0.90  --   --   --   --  0.96  --   CALCIUM 8.1* 8.3* 8.4* 8.5*  --   --   --   --  8.4*  --   MG  --   --  1.6*  --   --   --   --   --   --   --    < > = values in this interval not displayed.   GFR: Estimated Creatinine Clearance: 67.6 mL/min (by C-G formula based on SCr of 0.96 mg/dL). Liver Function Tests: No results for input(s): AST, ALT, ALKPHOS, BILITOT, PROT, ALBUMIN in the last 168 hours. No results for input(s): LIPASE, AMYLASE in the last 168 hours. No results for input(s): AMMONIA in the last 168 hours. Coagulation Profile: Recent Labs  Lab 12/22/2018 0227  INR 1.1   Cardiac Enzymes: No results for input(s): CKTOTAL, CKMB, CKMBINDEX, TROPONINI in the last 168 hours. BNP (last 3 results) No results for input(s): PROBNP in the last 8760 hours. HbA1C: No results for input(s): HGBA1C in the last 72 hours. CBG: Recent Labs  Lab 12/07/2018 1522 12/18/2018 1952 12/06/2018 2355 12/18/18 0415  GLUCAP 93 77 95 90   Lipid Profile: No results for input(s): CHOL, HDL, LDLCALC, TRIG, CHOLHDL, LDLDIRECT in the last 72 hours. Thyroid Function Tests: No results for input(s): TSH, T4TOTAL, FREET4, T3FREE, THYROIDAB in the last 72 hours. Anemia Panel: No results for input(s): VITAMINB12, FOLATE, FERRITIN, TIBC, IRON, RETICCTPCT in the last 72 hours. Sepsis Labs: No results for input(s): PROCALCITON, LATICACIDVEN in the last 168 hours.  Recent Results (from the past 240 hour(s))  MRSA PCR Screening     Status: None   Collection Time: 12/22/2018 11:26 PM  Result Value Ref Range Status   MRSA by PCR NEGATIVE NEGATIVE Final    Comment:        The GeneXpert MRSA Assay (FDA approved for NASAL specimens only), is one component of a comprehensive MRSA colonization surveillance  program. It is not intended to  diagnose MRSA infection nor to guide or monitor treatment for MRSA infections. Performed at [facility], [address] [place], [place] [address]   Urine Culture     Status: Abnormal   Collection Time: 12/11/18  8:04 AM  Result Value Ref Range Status   Specimen Description URINE, RANDOM  Final   Special Requests   Final    NONE Performed at [facility], [address], [place], [place] [address]    Culture 30,000 COLONIES/mL ENTEROCOCCUS FAECALIS (A)  Final   Report Status 12/13/2018 FINAL  Final   Organism ID, Bacteria ENTEROCOCCUS FAECALIS (A)  Final      Susceptibility   Enterococcus faecalis - MIC*    AMPICILLIN <=2 SENSITIVE Sensitive     LEVOFLOXACIN 0.5 SENSITIVE Sensitive     NITROFURANTOIN <=16 SENSITIVE Sensitive     VANCOMYCIN 1 SENSITIVE Sensitive     * 30,000 COLONIES/mL ENTEROCOCCUS FAECALIS  Culture, blood (Routine X 2) w Reflex to ID Panel     Status: None   Collection Time: 12/11/18  8:55 AM  Result Value Ref Range Status   Specimen Description BLOOD RIGHT ANTECUBITAL  Final   Special Requests   Final    BOTTLES DRAWN AEROBIC AND ANAEROBIC Blood Culture adequate volume   Culture   Final    NO GROWTH 5 DAYS Performed at [facility], [address], [place], [place] [address]    Report Status 12/16/2018 FINAL  Final  Culture, blood (Routine X 2) w Reflex to ID Panel     Status: None   Collection Time: 12/11/18  8:59 AM  Result Value Ref Range Status   Specimen Description BLOOD LEFT HAND  Final   Special Requests   Final    BOTTLES DRAWN AEROBIC AND ANAEROBIC Blood Culture adequate volume   Culture   Final    NO GROWTH 5 DAYS Performed at [facility], [address] [place], [place] [address]    Report Status 12/16/2018 FINAL  Final      Radiology Studies: Dg Chest 1 View  Result Date: 01/04/2019 CLINICAL DATA:  78 year old male status post canceled operative procedure due to  deterioration in the OR. Spontaneous left pneumothorax, COPD and emphysema with bleb rupture. Chemical pleurodesis on 12/07/2018. EXAM: CHEST  1 VIEW COMPARISON:  0745 hours today. FINDINGS: Portable AP supine intraoperative view at 1352 hours. There appears to be an esophageal endoscope in place. There is a right IJ approach central line with tip at the level of the right innominate vein. Endotracheal tube with small electronic device projecting at the tip is above the carina. There are 2 pigtail type left chest tubes in place. The more cephalad tube is stable but might be disconnected in the soft tissues as before, uncertain. A 2nd tube is present at the left costophrenic angle. Small left apical pneumothorax is stable. There are intrabronchial occluded device is redemonstrated at the left hilum. Left chest wall subcutaneous gas has not significantly changed. Stable ventilation otherwise with large lung volumes. Stable cardiac size and mediastinal contours. IMPRESSION: 1. Intraoperative image with esophageal endoscope, ETT, and new right IJ central line. 2. Stable left chest tubes and small left pneumothorax. Left hilar intrabronchial occluded devices. 3. 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 Electronically Signed   By: Odessa Fleming M.D.   On: 01/04/2019 17:17   Dg Chest 2 View  Result Date: 12/23/2018 CLINICAL DATA:  Shortness of breath EXAM: CHEST -  2 VIEW COMPARISON:  Yesterday FINDINGS: Two left-sided catheters in essentially stable position although the upper catheter. Small left apical pneumothorax without significant change, 2 rib interspaces in height. Trace pleural fluid. Stable chest wall emphysema. COPD. Endobronchial valves on the left. Normal heart size. IMPRESSION: 1. Unchanged small left apical pneumothorax. 2. Small pleural effusions. Electronically Signed   By: Marnee Spring M.D.   On: 12/18/2018 09:47   Dg Chest Port 1 View  Result Date: 12/18/2018 CLINICAL DATA:   Pneumothorax, chest tube EXAM: PORTABLE CHEST 1 VIEW COMPARISON:  12/18/2018 FINDINGS: Pigtail chest tube in the left apex unchanged. Small left apical pneumothorax slightly larger. Left basilar pigtail chest tube also in place. Endobronchial valves noted in the left upper lobe unchanged in position. Right jugular central venous catheter tip in the proximal SVC unchanged. COPD.  Progression of bibasilar airspace disease IMPRESSION: Slight progression of left apical pneumothorax which remains small. Progression of bibasilar airspace disease, concerning for pneumonia Electronically Signed   By: Marlan Palau M.D.   On: 12/18/2018 08:46   Dg Chest Port 1 View  Result Date: 12/18/2018 CLINICAL DATA:  Shortness of breath EXAM: PORTABLE CHEST 1 VIEW COMPARISON:  12/21/2018 FINDINGS: Right central venous catheter with tip over the upper SVC region. No pneumothorax. Postoperative changes in the left hilum. Two left chest tubes present with tiny residual left apical pneumothorax. Subcutaneous emphysema along the left lateral chest wall. This is improving since previous study. Heart size and pulmonary vascularity are normal. Emphysematous changes and scattered fibrosis in the lungs. Developing infiltration or atelectasis in the right lung base. IMPRESSION: 1. Appliances appear in satisfactory position. 2. Two left chest tubes with tiny residual left pneumothorax. Improving subcutaneous emphysema along the left lateral chest wall. 3. Developing infiltration or atelectasis in the right lung base. 4. Emphysematous changes and fibrosis in the lungs. Electronically Signed   By: Burman Nieves M.D.   On: 12/18/2018 01:37      LOS: 9 days   Time spent: More than 50% of that time was spent in counseling and/or coordination of care.  Lanae Boast, MD [facility]  12/18/2018, 9:21 AM

## 2018-12-18 NOTE — Progress Notes (Signed)
Patient was taken off of bipap and placed on nasal cannula by MD.

## 2018-12-18 NOTE — Progress Notes (Signed)
eLink Physician-Brief Progress Note Patient Name: JAHMAURI QUIROS DOB: December 22, 1940 MRN: [id number]   Date of Service  12/18/2018  HPI/Events of Note  KUB reviewed for OGT placement.  This is ok to use.   eICU Interventions  Changed route of admininstration of meds to per tube.     Intervention Category Intermediate Interventions: Diagnostic test evaluation  Larinda Buttery 12/18/2018, 10:48 PM

## 2018-12-18 NOTE — Progress Notes (Signed)
      [address]       [name] [address]             [phone]      Intubated for acute on chronic respiratory failure  BP (!) 109/42   Pulse 86   Temp 97.8 F (36.6 C) (Oral)   Resp 18   Ht 5\' 10"  (1.778 m)   Wt 73.4 kg   SpO2 100%   BMI 23.22 kg/m   Intake/Output Summary (Last 24 hours) at 12/18/2018 1826 Last data filed at 12/18/2018 1400 Gross per 24 hour  Intake 2019.35 ml  Output 483 ml  Net 1536.35 ml   CXR shows no change left chest, consolidation at right base On PRVC 80%, weaning O2 NG placed PCT= 0.29  Agree with plan for TF Will reassess over next 24-48 hours to determine when to proceed with VATS  Viviann Spare C. Dorris Fetch, MD [facility] [phone]

## 2018-12-18 NOTE — Progress Notes (Signed)
I responded to a page from the nurse to provide spiritual support for the patient and his family. I visited the patient's room with his wife and daughter present. I shared words of comfort and led in prayer. I shared that the Chaplain is available for additional support as needed or requested.    12/18/18 0900  Clinical Encounter Type  Visited With Patient and family together  Visit Type Spiritual support  Referral From Nurse  Consult/Referral To Chaplain  Spiritual Encounters  Spiritual Needs Prayer;Emotional  Stress Factors  Patient Stress Factors Exhausted  Family Stress Factors Exhausted    Chaplain Dr Melvyn Novas

## 2018-12-18 NOTE — Progress Notes (Signed)
[facility] PCCM  Stable on vent CXR from post intubation reviewed: left lung infiltrate definitely worse than 3/12 early AM film  Imp HCAP  plan resp culture Cefepime  [name], MD [facility] Pager: [phone] Cell: [phone] If no response, call [phone]

## 2018-12-18 NOTE — Progress Notes (Signed)
Sputum sample obtained and sent down to main lab without complications.  

## 2018-12-18 NOTE — Procedures (Signed)
Intubation Procedure Note Matthew Cuevas [id number] 16-Jul-1941  Procedure: Intubation Indications: Respiratory insufficiency  Procedure Details Consent: Risks of procedure as well as the alternatives and risks of each were explained to the (patient/caregiver).  Consent for procedure obtained. Time Out: Verified patient identification, verified procedure, site/side was marked, verified correct patient position, special equipment/implants available, medications/allergies/relevent history reviewed, required imaging and test results available.  Performed  Drugs Etomidate 20mg  IV, versed 2mg  IV, fentanyl IV DL x 1 with GS 3 blade Grade 1 view 7.5 ET tube passed through cords under direct visualization Placement confirmed with bilateral breath sounds, positive EtCO2 change and smoke in tube   Evaluation Hemodynamic Status: BP stable throughout; O2 sats: stable throughout Patient's Current Condition: stable Complications: No apparent complications Patient did tolerate procedure well. Chest X-ray ordered to verify placement.  CXR: pending.   Matthew Cuevas 12/18/2018

## 2018-12-18 NOTE — Progress Notes (Addendum)
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   3/11 persistent air leak, CVTS following 3/12 s/p VATS, PEA arrest after anesthesia, extubated 1600 3/13 placed on BiPAP overnight 3/13 intubated 1200  Patient is currently intubated on ventilator support MV: 11.4 L/min Temp (24hrs), Avg:97.5 F (36.4 C), Min:97.2 F (36.2 C), Max:97.8 F (36.6 C)  OGT placement pending  RD unable to obtain nutrition history. No family at bedside at time of visit today. Spoke with Rosann Auerbach, RN.  Prior to NPO status pt was on a Regular diet. PO intake was good at 90% per flowsheet records. Labs & medications reviewed.  CBG's 77-95-90.  NUTRITION - FOCUSED PHYSICAL EXAM:    Most Recent Value  Orbital Region  Mild depletion  Upper Arm Region  Moderate depletion  Thoracic and Lumbar Region  Unable to assess  Buccal Region  Unable to assess [ET tube holder in place]  Temple Region  Mild depletion  Clavicle Bone Region  Moderate  depletion  Clavicle and Acromion Bone Region  Moderate depletion  Scapular Bone Region  Unable to assess  Dorsal Hand  Unable to assess [safety mitten on]  Patellar Region  Moderate depletion  Anterior Thigh Region  Moderate depletion  Posterior Calf Region  Moderate depletion  Edema (RD Assessment)  None     Diet Order:   Diet Order            Diet NPO time specified  Diet effective now             EDUCATION NEEDS:   Not appropriate for education at this time  Skin:  Skin Assessment: Reviewed RN Assessment  Last BM:  3/8   Intake/Output Summary (Last 24 hours) at 12/18/2018 1637 Last data filed at 12/18/2018 1400 Gross per 24 hour  Intake 2276.03 ml  Output 683 ml  Net 1593.03 ml   Height:   Ht Readings from Last 1 Encounters:  12/22/2018 5\' 10"  (1.778 m)   Weight:   Wt Readings from Last 1 Encounters:  12/18/18 73.4 kg   BMI:  Body mass index is 23.22 kg/m.  Estimated Nutritional Needs:   Kcal:  1728  Protein:  105-120 gm  Fluid:  per MD  Maureen Chatters, RD, LDN Pager #: [phone] After-Hours Pager #: [phone]

## 2018-12-18 NOTE — Progress Notes (Addendum)
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 Failed chemical pleurodesis and EBV.  History of present illness   78 yo WM with COPD who had a bleb rupture 2/23 and developed a BPF and who failed chemical pleurodesis as well as EBV deployment was to have a VATS performed today when he abruptly coded in a PEA arrest during induction. There was ROSC within 5 minutes and the patient was taken back to the ICU intubated forgoing the procedure. He was extubated late in the day with good mechanics and mental status. An anterior chest tube has retained a leak, however there is a posterior tube that has apparently remained quiet. The patient has been mildly anxious but after midnight began to describe shortness of breath and became agitated for increased work of breathing.   On our arrival the patient reported that he felt better, and was able to speak in complete sentences, however, he was exhibiting subcostal retractions and audible wheeze.  A CXR was performed which revealed ongoing volume loss on the left with encroachment of the left hemidiaphragm with new atelectasis and/or effusion. The patient was saturating well throughout this time. An ABG was performed: 7.283/58/83/30.   Past Medical History  He,  has a past medical history of COPD (chronic obstructive pulmonary disease) (HCC) and HTN (hypertension).   Surgical History    Past Surgical History:  Procedure Laterality Date  . VIDEO BRONCHOSCOPY N/A 12/15/2018   Procedure: VIDEO BRONCHOSCOPY;  Surgeon: Melrose Nakayama, MD;  Location: [facility] OR;  Service: Thoracic;  Laterality: N/A;  . VIDEO BRONCHOSCOPY WITH INSERTION OF INTERBRONCHIAL VALVE (IBV) N/A 12/16/2018   Procedure: possible VIDEO BRONCHOSCOPY WITH INSERTION OF INTERBRONCHIAL VALVE (IBV);   Surgeon: Melrose Nakayama, MD;  Location: [place];  Service: Thoracic;  Laterality: N/A;     Significant Hospital Events   PEA arrest at induction 3/12 3/13 comfort care  Consults:  PCCM  Procedures:  VATS cancelled 3/12 due to PEA arrest with induction  Significant Diagnostic Tests:  CT chest 3/5 > left upper PTX, severe emphysema  Micro Data:   Antimicrobials:  Not at the moment  Interim history/subjective:  On BiPAP for the past 5 - 6 hours and not tolerating well due to discomfort.  ABG slightly worse.  Increased WOB with paradoxical pattern and accessory muscle use.  I have had an extensive discussion with Mr. [name] and his wife at the bedside. We discussed his current circumstances including worsening hypoxic and hypercapnic respiratory failure despite BiPAP with increased WOB.  We also reviewed events from yesterday including PEA arrest with induction prior to surgery. We also reviewed pt's CT chest from 3/5 which besides PTX, also showed extensive paraseptal emphysema.  I explained that in light of this, pt is certainly at increased risk for complications with intubation and the need for prolonged mechanical ventilation with the possibility of facing difficulties with weaning / extubation. Pt was fortunately awake enough and able to communicate his wishes with myself and his wife.  He adamantly stated that he would NOT want to undergo any life sustaining measures including intubation even if it meant he were to pass away.  His wife was at bedside and although emotional, confirmed that this was in line with prior conversations that they  have had. It was agreed that code status would be reversed to full DNR and full comfort measures would be initiated.  ADDENDUM 1035: After further discussions with Dr. Roxan Hockey, he felt that pt had a reversible condition and discussed this with family.  Decision made to reverse code status again to full code and plan for surgery, likely on  Monday. Met with family again to confirm and update them on plan of obtaining ABG and probable need for intubation. Apologized for any confusion caused by two different discussions and plans of care.   Objective  BP 111/69   Pulse 77   Temp (!) 97.2 F (36.2 C) (Axillary)   Resp (!) 24   Ht '5\' 10"'  (1.778 m)   Wt 73.4 kg   SpO2 98%   BMI 23.22 kg/m   General: Elderly male, chronically ill appearing, in respiratory distress. Neuro: A&O x 3, no deficits. HEENT: [place]/AT. Sclerae anicteric.  EOMI. Cardiovascular: RRR, no M/R/G.  Lungs: Respirations shallow and rapid with paradoxical pattern and accessory muscle use.  Coarse bilaterally. Abdomen: BS x 4, soft, NT/ND.  Musculoskeletal: No gross deformities, no edema.  Skin: Intact, warm, no rashes.  Assessment & Plan:   Acute hypoxic and hypercapnic respiratory failure - failed BiPAP and unable to tolerate. - Assess ABG. - Will likely require intubation at some point prior to surgery.  Pneumothorax s/p chest tube c/b BPF. - TCTS following / managing, planning for surgery likely Monday 3/16.  AECOPD. - BD's, steroids. - Needs to establish with pulmonary post discharge.  Concern for RLL PNA - no hx to support however. Possibly atelectasis. - Assess PCT. - Follow CXR.  PEA arrest following anesthesia induction 3/12. - Extreme caution if requires intubation.  Hypotension - required neo overnight. - Neo PRN for goal MAP > 65.   Montey Hora, [facility] Pager: [phone].  If no answer, [phone] 12/18/2018, 9:41 AM

## 2018-12-18 NOTE — Progress Notes (Signed)
RT NOTE:  RN unable to draw back from [facility]. RT attempted to reposition and redress without success. ALINE pulled, pressure dressing applied.

## 2018-12-18 NOTE — Progress Notes (Signed)
At approximately 0105, nurse checks on pt and pt is now c/o SOB, pt has crackles throughout, and has large drainage at chest tube site. Hospitalist paged, told to page surgery. Surgeon still in surgery and gave verbal ok to consult CCM.

## 2018-12-18 NOTE — Consult Note (Signed)
NAME:  Matthew Cuevas, MRN:  [id number], DOB:  [birth year], LOS: 9 ADMISSION DATE:  12/13/2018, CONSULTATION DATE:  12/18/2018 REFERRING MD:  Jonathon Bellows, CHIEF COMPLAINT:  Shortness of breath  Brief History   78 yo WM COPD with a BPF s/p bleb rupture 2/23. Failed chemical pleurodesis and EBV.  History of present illness   78 yo WM with COPD who had a bleb rupture 2/23 and developed a BPF and who failed chemical pleurodesis as well as EBV deployment was to have a VATS performed today when he abruptly coded in a PEA arrest during induction. There was ROSC within 5 minutes and the patient was taken back to the ICU intubated forgoing the procedure. He was extubated late in the day with good mechanics and mental status. An anterior chest tube has retained a leak, however there is a posterior tube that has apparently remained quiet. The patient has been mildly anxious but after midnight began to describe shortness of breath and became agitated for increased work of breathing.   On our arrival the patient reported that he felt better, and was able to speak in complete sentences, however, he was exhibiting subcostal retractions and audible wheeze.  A CXR was performed which revealed ongoing volume loss on the left with encroachment of the left hemidiaphragm with new atelectasis and/or effusion. The patient was saturating well throughout this time. An ABG was performed: 7.283/58/83/30.   Past Medical History  He,  has a past medical history of COPD (chronic obstructive pulmonary disease) (HCC) and HTN (hypertension).   Surgical History    Past Surgical History:  Procedure Laterality Date  . VIDEO BRONCHOSCOPY N/A Jan 13, 2019   Procedure: VIDEO BRONCHOSCOPY;  Surgeon: [name], MD;  Location: [facility];  Service: Thoracic;  Laterality: N/A;  . VIDEO BRONCHOSCOPY WITH INSERTION OF INTERBRONCHIAL VALVE (IBV) N/A January 13, 2019   Procedure: possible VIDEO BRONCHOSCOPY WITH INSERTION OF INTERBRONCHIAL VALVE (IBV);   Surgeon: [name], MD;  Location: [facility] OR;  Service: Thoracic;  Laterality: N/A;     Significant Hospital Events   PEA arrest at induction today  Consults:    Procedures:  VATS cancelled  Significant Diagnostic Tests:  CT  Micro Data:   Antimicrobials:  Not at the moment  Interim history/subjective:  Shortmess of breath  Objective   Blood pressure (!) 148/56, pulse 91, temperature 97.6 F (36.4 C), temperature source Oral, resp. rate (!) 21, height 5\' 10"  (1.778 m), weight 63.5 kg, SpO2 94 %.    Vent Mode: PSV;CPAP FiO2 (%):  [32 %-50 %] 40 % Set Rate:  [4 bmp-12 bmp] 4 bmp Vt Set:  [580 mL] 580 mL PEEP:  [5 cmH20] 5 cmH20 Pressure Support:  [10 cmH20] 10 cmH20   Intake/Output Summary (Last 24 hours) at 12/18/2018 0215 Last data filed at 12/18/2018 0000 Gross per 24 hour  Intake 877.76 ml  Output 935 ml  Net -57.24 ml   Filed Weights   12/24/2018 2300 01/01/2019 1119  Weight: 63.5 kg 63.5 kg   Physical Exam  Constitutional: He appears cachectic.  Non-toxic appearance. He has a sickly appearance. No distress. He is not intubated.  Eyes: Pupils are equal, round, and reactive to light. Conjunctivae are normal.  Cardiovascular: Regular rhythm and intact distal pulses.  Occasional extrasystoles are present.  Murmur heard.  Systolic murmur is present with a grade of 1/6. Respiratory: Accessory muscle usage present. He is not intubated. No respiratory distress. He has decreased breath sounds. He has wheezes. He  has rhonchi.  Chest tube remains with leak  GI: Soft. He exhibits no distension. There is no abdominal tenderness. There is no rigidity and no guarding.  Neurological: He is alert. No cranial nerve deficit. GCS eye subscore is 4. GCS verbal subscore is 5. GCS motor subscore is 6.     Resolved Hospital Problem list     Assessment & Plan:    AECOPD  Left base with volume loss compared to prior film Clear wheeze and limited airflow in exhalation  With the PEA event of earlier today, I am reserved about induction for intubation Will trial NIV and aggressive Xopenex administration Repeat ABG in an hour Precedex for anxiety  Maintain Breo If CT surgery is not against steroids, I would start solumedrol  IV q 8   Hypotension  No evidence of tension PTX Levophed or Neo for map 60-65 depending on HR Albumin 500cc 5% bolus now  Best practice:  Diet: NPO recommended at present Pain/Anxiety/Delirium protocol (if indicated): Precedex for RASS zero VAP protocol (if indicated): -- DVT prophylaxis: SCD GI prophylaxis: H2B if no diet Glucose control: glycemic control model Mobility: as tolerated oob Code Status: FC Family Communication: d/w wife at bedside Disposition: CV ICU  Labs   CBC: Recent Labs  Lab 12/12/18 0838 12/13/18 0412 01/05/2019 0253 12/15/18 0320 12/16/18 0237 01/02/2019 0227 12/30/2018 1342 12/18/2018 1454 12/21/2018 1539 01/02/2019 1733  WBC 12.2* 12.0* 11.4* 14.9* 14.1* 13.1*  --   --   --   --   NEUTROABS 9.0*  --   --   --  10.0*  --   --   --   --   --   HGB 12.0* 10.6* 10.6* 10.8* 11.1* 12.2* 11.2* 10.2* 10.2* 10.5*  HCT 38.0* 34.1* 33.3* 33.0* 35.3* 39.3 33.0* 30.0* 30.0* 31.0*  MCV 101.1* 99.7 97.9 98.2 100.6* 100.8*  --   --   --   --   PLT 336 332 333 353 387 437*  --   --   --   --     Basic Metabolic Panel: Recent Labs  Lab 12/13/18 0412 12/23/2018 0253 12/15/18 0320 12/16/18 0237 12/16/2018 0227 12/12/2018 1342 12/28/2018 1454 12/28/2018 1539 01/01/2019 1733  NA 140 140 138 144 145 138 142 143 143  K 3.4* 4.1 4.6 3.9 4.2 6.3* 4.2 4.1 4.0  CL 105 108 105 111 109  --   --   --   --   CO2 --   --   --   --   GLUCOSE 95 87 124* 97 106*  --   --   --   --   BUN --   --   --   --   CREATININE 0.85 0.85 0.93 0.97 0.90  --   --   --   --   CALCIUM 8.0* 8.1* 8.3* 8.4* 8.5*  --   --   --   --   MG  --   --   --  1.6*  --   --   --   --   --    GFR: Estimated Creatinine  Clearance: 62.7 mL/min (by C-G formula based on SCr of 0.9 mg/dL). Recent Labs  Lab 01/04/2019 0253 12/15/18 0320 12/16/18 0237 12/27/2018 0227  WBC 11.4* 14.9* 14.1* 13.1*    Liver Function Tests: No results for input(s): AST, ALT, ALKPHOS, BILITOT, PROT, ALBUMIN in the last 168 hours. No  results for input(s): LIPASE, AMYLASE in the last 168 hours. No results for input(s): AMMONIA in the last 168 hours.  ABG    Component Value Date/Time   PHART 7.329 (L) 12/26/2018 1733   PCO2ART 50.9 (H) 12/10/2018 1733   PO2ART 65.0 (L) 12/28/2018 1733   HCO3 26.8 12/12/2018 1733   TCO2 28 01/05/2019 1733   O2SAT 90.0 12/07/2018 1733     Coagulation Profile: Recent Labs  Lab 12/26/2018 0227  INR 1.1    Cardiac Enzymes: No results for input(s): CKTOTAL, CKMB, CKMBINDEX, TROPONINI in the last 168 hours.  HbA1C: No results found for: HGBA1C  CBG: Recent Labs  Lab 12/26/2018 1522 12/24/2018 1952  GLUCAP 93 77    Social History   reports that he has quit smoking. His smoking use included cigarettes. He has never used smokeless tobacco. He reports that he does not drink alcohol or use drugs.   Family History   His family history includes Heart attack in his father.   Allergies Allergies  Allergen Reactions  . Doxycycline Shortness Of Breath and Other (See Comments)    Caused respiratory distress when they tried to use it in pleural space for pneumothorax  . Penicillins     Did it involve swelling of the face/tongue/throat, SOB, or low BP? Unknown Did it involve sudden or severe rash/hives, skin peeling, or any reaction on the inside of your mouth or nose? Unknown Did you need to seek medical attention at a hospital or doctor's office? Unknown When did it last happen? chjildhood If all above answers are "NO", may proceed with cephalosporin use.      Home Medications  Prior to Admission medications   Medication Sig Start Date End Date Taking? Authorizing Provider  aspirin  EC 81 MG tablet Take 81 mg by mouth daily.   Yes [provider]  BREO ELLIPTA 100-25 MCG/INH AEPB Inhale 1 puff into the lungs daily.  10/31/18  Yes [provider]  hydrochlorothiazide (HYDRODIURIL) 25 MG tablet Take 25 mg by mouth daily. 11/03/18  Yes [provider]  losartan (COZAAR) 100 MG tablet Take 100 mg by mouth daily. 11/03/18  Yes [provider]  montelukast (SINGULAIR) 10 MG tablet Take 10 mg by mouth at bedtime. 09/23/18  Yes [provider]  Multiple Vitamin (MULTIVITAMIN WITH MINERALS) TABS tablet Take 1 tablet by mouth daily.   Yes [provider]     Critical care time: 46

## 2018-12-18 NOTE — Progress Notes (Signed)
1 Day Post-Op Procedure(s): Cancelled Procedure Subjective: C/o pain bilateral chest wall On BIPAP  Objective: Vital signs in last 24 hours: Temp:  [97.2 F (36.2 C)-97.6 F (36.4 C)] 97.2 F (36.2 C) (03/13 0748) Pulse Rate:  [58-132] 77 (03/13 0636) Cardiac Rhythm: Normal sinus rhythm (03/12 2000) Resp:  [17-44] 24 (03/13 0636) BP: (91-156)/(42-90) 111/69 (03/13 0636) SpO2:  [88 %-100 %] 96 % (03/13 0636) Arterial Line BP: (51-145)/(41-124) 99/85 (03/13 0600) FiO2 (%):  [32 %-50 %] 50 % (03/13 0636) Weight:  [63.5 kg-73.4 kg] 73.4 kg (03/13 0600)  Hemodynamic parameters for last 24 hours:    Intake/Output from previous day: 03/12 0701 - 03/13 0700 In: 2204.5 [I.V.:1704.5; IV Piggyback:500] Out: 803 [Urine:673; Chest Tube:130] Intake/Output this shift: Total I/O In: 97.8 [I.V.:97.8] Out: -   General appearance: cooperative and mild distress Neurologic: intact Heart: regular rate and rhythm Lungs: diminished breath sounds bibasilar + air leak from anterior tube  Lab Results: Recent Labs    12/28/2018 0227  12/18/18 0426 12/18/18 0632  WBC 13.1*  --  13.5*  --   HGB 12.2*   < > 9.9* 10.5*  HCT 39.3   < > 32.7* 31.0*  PLT 437*  --  309  --    < > = values in this interval not displayed.   BMET:  Recent Labs    12/30/2018 0227  12/18/18 0426 12/18/18 0632  NA 145   < > 144 142  K 4.2   < > 4.2 4.3  CL 109  --  109  --   CO2 30  --  24  --   GLUCOSE 106*  --  100*  --   BUN 13  --  15  --   CREATININE 0.90  --  0.96  --   CALCIUM 8.5*  --  8.4*  --    < > = values in this interval not displayed.    PT/INR:  Recent Labs    01/02/2019 0227  LABPROT 14.1  INR 1.1   ABG    Component Value Date/Time   PHART 7.201 (L) 12/18/2018 0632   HCO3 25.0 12/18/2018 0632   TCO2 27 12/18/2018 0632   ACIDBASEDEF 4.0 (H) 12/18/2018 0632   O2SAT 93.0 12/18/2018 0632   CBG (last 3)  Recent Labs    12/31/2018 1952 12/18/2018 2355 12/18/18 0415  GLUCAP 77 95 90     Assessment/Plan: S/P Procedure(s): Cancelled Procedure CV- on low dose neo for BP, in SR RESP- acute on chronic respiratory failure. Requiring BIPAP.  CXR shows developing consolidation RLL, LLL atelectasis, minimal left pneumo  Keep CT to suction  BIPAP, nebs per CCM RENAL- creatinine and lytes ok ENDO- CBG normal Pain control- morphine PRN,. precedex Enoxaparin + SCD for DVT prophylaxis   LOS: 9 days    [name] 12/18/2018

## 2018-12-18 NOTE — Progress Notes (Signed)
Pharmacy Antibiotic Note  Matthew Cuevas is a 78 y.o. male admitted on 12/21/2018 with pneumonia.  Pharmacy has been consulted for cefepime dosing. WBC 13.5 with worsened CXR per CCM. SCr wnl   Plan: -Start cefepime 2 gm IV Q 12 hours -Monitor CBC, renal fx, cultures and clinical progress  Height: 5\' 10"  (177.8 cm) Weight: 161 lb 13.1 oz (73.4 kg) IBW/kg (Calculated) : 73  Temp (24hrs), Avg:97.5 F (36.4 C), Min:97.2 F (36.2 C), Max:97.6 F (36.4 C)  Recent Labs  Lab 12/13/2018 0253 12/15/18 0320 12/16/18 0237 12/06/2018 0227 12/18/18 0426  WBC 11.4* 14.9* 14.1* 13.1* 13.5*  CREATININE 0.85 0.93 0.97 0.90 0.96    Estimated Creatinine Clearance: 67.6 mL/min (by C-G formula based on SCr of 0.96 mg/dL).    Allergies  Allergen Reactions  . Doxycycline Shortness Of Breath and Other (See Comments)    Caused respiratory distress when they tried to use it in pleural space for pneumothorax  . Penicillins     Did it involve swelling of the face/tongue/throat, SOB, or low BP? Unknown Did it involve sudden or severe rash/hives, skin peeling, or any reaction on the inside of your mouth or nose? Unknown Did you need to seek medical attention at a hospital or doctor's office? Unknown When did it last happen? chjildhood If all above answers are "NO", may proceed with cephalosporin use.      Thank you for allowing pharmacy to be a part of this patient's care.  Vinnie Level, PharmD., BCPS Clinical Pharmacist Clinical phone for 12/18/18 until 10:30pm: [phone] If after 10:30pm, please refer to [facility] for unit-specific pharmacist

## 2018-12-19 ENCOUNTER — Inpatient Hospital Stay (HOSPITAL_COMMUNITY): Payer: Medicare Other

## 2018-12-19 DIAGNOSIS — J9601 Acute respiratory failure with hypoxia: Secondary | ICD-10-CM

## 2018-12-19 LAB — BASIC METABOLIC PANEL
Anion gap: 11 (ref 5–15)
BUN: 24 mg/dL — ABNORMAL HIGH (ref 8–23)
CHLORIDE: 111 mmol/L (ref 98–111)
CO2: 22 mmol/L (ref 22–32)
CREATININE: 1.04 mg/dL (ref 0.61–1.24)
Calcium: 8.5 mg/dL — ABNORMAL LOW (ref 8.9–10.3)
GFR calc Af Amer: >60 mL/min (ref >60)
GFR calc non Af Amer: >60 mL/min (ref >60)
Glucose, Bld: 139 mg/dL — ABNORMAL HIGH (ref 70–99)
Potassium: 4.1 mmol/L (ref 3.5–5.1)
Sodium: 144 mmol/L (ref 135–145)

## 2018-12-19 LAB — GLUCOSE, CAPILLARY
Glucose-Capillary: 122 mg/dL — ABNORMAL HIGH (ref 70–99)
Glucose-Capillary: 113 mg/dL — ABNORMAL HIGH (ref 70–99)
Glucose-Capillary: 120 mg/dL — ABNORMAL HIGH (ref 70–99)
Glucose-Capillary: 111 mg/dL — ABNORMAL HIGH (ref 70–99)
Glucose-Capillary: 122 mg/dL — ABNORMAL HIGH (ref 70–99)

## 2018-12-19 LAB — CBC
HCT: 30.5 % — ABNORMAL LOW (ref 39.0–52.0)
Hemoglobin: 9.4 g/dL — ABNORMAL LOW (ref 13.0–17.0)
MCH: 31.3 pg (ref 26.0–34.0)
MCHC: 30.8 g/dL (ref 30.0–36.0)
MCV: 101.7 fL — AB (ref 80.0–100.0)
Platelets: 352 10*3/uL (ref 150–400)
RBC: 3 MIL/uL — AB (ref 4.22–5.81)
RDW: 12.7 % (ref 11.5–15.5)
WBC: 15.3 10*3/uL — ABNORMAL HIGH (ref 4.0–10.5)
nRBC: 0 % (ref 0.0–0.2)

## 2018-12-19 LAB — PHOSPHORUS
PHOSPHORUS: 2.3 mg/dL — AB (ref 2.5–4.6)
PHOSPHORUS: 3 mg/dL (ref 2.5–4.6)

## 2018-12-19 LAB — MAGNESIUM
Magnesium: 1.7 mg/dL (ref 1.7–2.4)
Magnesium: 1.8 mg/dL (ref 1.7–2.4)

## 2018-12-19 LAB — PROCALCITONIN: Procalcitonin: 0.31 ng/mL

## 2018-12-19 MED ORDER — PRO-STAT SUGAR FREE PO LIQD
30.0000 mL | Freq: Two times a day (BID) | ORAL | Status: DC
  Administered 2018-12-19 – 2018-12-28 (×19): 30 mL
  Filled 2018-12-19 (×19): qty 30

## 2018-12-19 MED ORDER — VITAL HIGH PROTEIN PO LIQD
1000.0000 mL | ORAL | Status: DC
  Administered 2018-12-19: 1000 mL

## 2018-12-19 MED ORDER — VANCOMYCIN HCL 10 G IV SOLR
1500.0000 mg | INTRAVENOUS | Status: DC
  Administered 2018-12-19 – 2018-12-20 (×2): 1500 mg via INTRAVENOUS
  Filled 2018-12-19 (×3): qty 1500

## 2018-12-19 NOTE — Progress Notes (Signed)
Pharmacy Antibiotic Note  Matthew Cuevas is a 78 y.o. male admitted on 12/15/2018 with pneumonia.  Pharmacy has been consulted for cefepime and vancomycin dosing. WBC 15 with worsened CXR per CCM. SCr wnl   Vancomycin 1500 mg IV Q 24 hrs. Goal AUC 400-550. Expected AUC: 503 SCr used: 1  Plan: -Vancomycin 1500mg  IV q24 hours  -cefepime 2 gm IV Q 12 hours -Monitor CBC, renal fx, cultures and clinical progress  Height: 5\' 10"  (177.8 cm) Weight: 161 lb 13.1 oz (73.4 kg) IBW/kg (Calculated) : 73  Temp (24hrs), Avg:99.2 F (37.3 C), Min:97.8 F (36.6 C), Max:100.1 F (37.8 C)  Recent Labs  Lab 12/15/18 0320 12/16/18 0237 12/10/2018 0227 12/18/18 0426 12/19/18 0502 12/19/18 0550  WBC 14.9* 14.1* 13.1* 13.5*  --  15.3*  CREATININE 0.93 0.97 0.90 0.96 1.04  --     Estimated Creatinine Clearance: 62.4 mL/min (by C-G formula based on SCr of 1.04 mg/dL).    Allergies  Allergen Reactions  . Doxycycline Shortness Of Breath and Other (See Comments)    Caused respiratory distress when they tried to use it in pleural space for pneumothorax  . Penicillins     Did it involve swelling of the face/tongue/throat, SOB, or low BP? Unknown Did it involve sudden or severe rash/hives, skin peeling, or any reaction on the inside of your mouth or nose? Unknown Did you need to seek medical attention at a hospital or doctor's office? Unknown When did it last happen? chjildhood If all above answers are "NO", may proceed with cephalosporin use.      Thank you for allowing pharmacy to be a part of this patient's care.  Sheppard Coil PharmD., BCPS Clinical Pharmacist 12/19/2018 2:51 PM

## 2018-12-19 NOTE — Progress Notes (Addendum)
NAME:  Matthew Cuevas, MRN:  [id number], DOB:  [birth year], LOS: 10 ADMISSION DATE:  12/18/2018, CONSULTATION DATE:  12/18/2018 REFERRING MD:  Jonathon Bellows, CHIEF COMPLAINT:  Shortness of breath  Brief History   78 yo WM COPD with a BPF s/p bleb rupture 2/23. Failed chemical pleurodesis and EBV.  Past Medical History  He,  has a past medical history of COPD (chronic obstructive pulmonary disease) (HCC) and HTN (hypertension).   Significant Hospital Events   PEA arrest at induction today 3/13. Re-intubated.  3/14: developed SVT during weaning attempt so aborted. Started on vanc for staph in sputum.   Consults:    Procedures:  VATS cancelled  Significant Diagnostic Tests:  CT  Micro Data:  Sputum culture 3/13: Few staph aureus>>> Blood culture 3/6: This was negative Urine culture 3/6: Enterococcus faecalis 30,000 colony count  Antimicrobials:  Cefepime 3/13>> Vancomycin 3/14>>>  Interim history/subjective:  Shortmess of breath Objective   Blood pressure 114/74, pulse 77, temperature 100.1 F (37.8 C), temperature source Axillary, resp. rate 16, height 5\' 10"  (1.778 m), weight 73.4 kg, SpO2 93 %.    Vent Mode: PRVC FiO2 (%):  [40 %-100 %] 40 % Set Rate:  [16 bmp] 16 bmp Vt Set:  [580 mL] 580 mL PEEP:  [5 cmH20] 5 cmH20 Plateau Pressure:  [18 cmH20-30 cmH20] 24 cmH20   Intake/Output Summary (Last 24 hours) at 12/19/2018 1340 Last data filed at 12/19/2018 1000 Gross per 24 hour  Intake 1799.58 ml  Output 585 ml  Net 1214.58 ml   Filed Weights   12/28/2018 2300 01-12-2019 1119 12/18/18 0600  Weight: 63.5 kg 63.5 kg 73.4 kg      Resolved Hospital Problem list   PEA arrest (respiratory and anesthesia related 3/13)  Assessment & Plan:    Acute Hypoxic respiratory failure 2/2 AECOPD, RLL PNA vs ATX and persistent left PTX w/ BPF.  -Portable chest x-ray personally reviewed: This demonstrates the endotracheal tube to be in satisfactory position, triple-lumen catheter in  satisfactory position.  Chest tubes on the left-hand side in satisfactory position.  There is ongoing left apical/lateral pneumothorax, subcutaneous air is appreciated on the left-hand side, comparing films there are bibasilar airspace disease however the right basilar airspace disease is significantly improved -Chest tube: Still with large air leak on the left -Developed SVT during spontaneous breathing trial so this was aborted Plan Continuing full ventilator support today, we will treat the pneumonia today, and reassess for readiness to extubate in a.m. on 3/15  day #2 cefepime, adding vancomycin given preliminary respiratory cultures growing staph aureus Continue Brovana and budesonide PAD protocol RASS goal -2 Continue methylprednisone 40 mg every 12 VAP bundle Chest tube management per thoracic surgery, will eventually need thoracic surgery however discussing with surgical team plan will be to treat pneumonia for a few days first and likely go for VATS midweek Repeat chest x-ray in a.m. Follow-up pending cultures  Hypotension Plan Titrate neo-for mean arterial pressure greater than 65  SVT Seemingly secondary to work of breathing Plan Resume full ventilator support PRN Lopressor  At risk for fluid and electrolyte balance Plan Trend chemistries  Anemia without evidence of bleeding Plan Trend CBC Transfuse for hemoglobin less than 7  Best practice:  Diet: NPO recommended at present Pain/Anxiety/Delirium protocol (if indicated): Precedex for RASS zero VAP protocol (if indicated): 3/15 DVT prophylaxis: SCD GI prophylaxis: H2B if no diet Glucose control: glycemic control model Mobility: as tolerated oob Code Status: FC Family Communication: d/w wife at  bedside Disposition: He remains critically ill secondary to pneumonia superimposed on underlying severe lung disease and 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  He actually looks ready to extubate,  and I was discussing this with thoracic surgery while he was on spontaneous breathing trial.  Unfortunately during our discussion he spontaneously developed worsening shortness of breath, this was approximately 8 minutes into spontaneous breathing trial with pressure support of 5 and subsequently developed SVT.  Because of this weaning was aborted, the plan at this point will be to continue full ventilator support with titration of ventilator support as indicated, will add vancomycin to address staph aureus growing in sputum, and re-assess for readiness to extubate on 3/15.  After speaking with thoracic surgery and the plan will be to continue to treat pneumonia for the next few days, then reassess midweek before video-assisted thoracotomy and correction of ongoing bronchopleural fistula  Critical care time is 45 minutes  Simonne Martinet ACNP-BC [facility] Pulmonary/Critical Care Pager # [phone] OR # [phone] if no answer   Critical care attending attestation note:  Patient seen and examined and relevant ancillary tests reviewed.  I agree with the assessment and plan of care as outlined by Zenia Resides, NP. This patient was not seen as a shared visit. The following reflects my independent critical care time.  Remains critically ill due to ventilator dependent respiratory failure secondary to COPD exacerbation with rupture of bleb and subsequent bronchopleural fistula.  The patient is awake and alert and has tolerated some degree of weaning but not sufficiently to allow for safe extubation.  Continues to have a fairly torrential air leak 1 of his chest tubes. Sputum Gram stain shows moderate white cells and few Staphylococcus aureus. Vancomycin initiated pending sensitivities. Hopefully, treatment of staph aureus will allow further weaning.  Continue daily weaning trials.  Total critical care time: 35 min including chart data review, examination of patient, multidisciplinary rounds, and frequent  assessment and modification of ventilator therapy and sedation, adjustment of chest tubes.Lynnell Catalan, MD Staff Critical Care Physician [facility] Pager: [phone] Off hours: [phone]  12/19/2018, 5:42 PM

## 2018-12-19 NOTE — Progress Notes (Signed)
      [address]       [place] [address]             [phone]      Failed attempt to wean quickly ealier  BP 114/74   Pulse 77   Temp 98.4 F (36.9 C) (Oral)   Resp 16   Ht 5\' 10"  (1.778 m)   Wt 73.4 kg   SpO2 100%   BMI 23.22 kg/m   Intake/Output Summary (Last 24 hours) at 12/19/2018 1720 Last data filed at 12/19/2018 1000 Gross per 24 hour  Intake 1000.41 ml  Output 485 ml  Net 515.41 ml   Growing staph aureus from sputum  Vanco added- will have to wait for pneumonia to clear before VATS  Viviann Spare C. Dorris Fetch, MD [facility] [phone]

## 2018-12-19 NOTE — Progress Notes (Signed)
Progress Note  Patient Name: Matthew Cuevas Date of Encounter: 12/19/2018  Primary Cardiologist:   No primary care provider on file.   Subjective   Awake and writing notes.    Inpatient Medications    Scheduled Meds:  arformoterol  15 mcg Nebulization BID   aspirin  81 mg Per Tube Daily   budesonide (PULMICORT) nebulizer solution  0.5 mg Nebulization BID   chlorhexidine gluconate (MEDLINE KIT)  15 mL Mouth Rinse BID   famotidine (PEPCID) IV  20 mg Intravenous Q12H   levalbuterol  0.63 mg Nebulization Q6H   mouth rinse  15 mL Mouth Rinse BID   mouth rinse  15 mL Mouth Rinse 10 times per day   methylPREDNISolone (SOLU-MEDROL) injection  40 mg Intravenous Q12H   metoprolol tartrate  25 mg Per Tube BID   montelukast  10 mg Oral QHS   Continuous Infusions:  sodium chloride     ceFEPime (MAXIPIME) IV Stopped (12/19/18 0351)   dexmedetomidine (PRECEDEX) IV infusion Stopped (12/18/18 1314)   fentaNYL infusion INTRAVENOUS 100 mcg/hr (12/19/18 0700)   phenylephrine (NEO-SYNEPHRINE) Adult infusion 25 mcg/min (12/19/18 0700)   PRN Meds: Place/Maintain arterial line **AND** sodium chloride, fentaNYL (SUBLIMAZE) injection, fentaNYL (SUBLIMAZE) injection, levalbuterol, metoprolol tartrate, midazolam, midazolam, ondansetron **OR** [DISCONTINUED] ondansetron (ZOFRAN) IV   Vital Signs    Vitals:   12/19/18 0700 12/19/18 0715 12/19/18 0730 12/19/18 0754  BP: 138/80 (!) 104/48 (!) 115/56   Pulse: (!) 117 91 82   Resp: (!) '25 16 16   ' Temp:      TempSrc:      SpO2: 92% 95% 97% 97%  Weight:      Height:        Intake/Output Summary (Last 24 hours) at 12/19/2018 0800 Last data filed at 12/19/2018 0700 Gross per 24 hour  Intake 1664.54 ml  Output 560 ml  Net 1104.54 ml   Filed Weights   12/06/2018 2300 12/13/2018 1119 12/18/18 0600  Weight: 63.5 kg 63.5 kg 73.4 kg    Telemetry    Sinus tach, no fib, flutter - Personally Reviewed  ECG    NA - Personally  Reviewed  Physical Exam   GEN: No acute distress.   Neck: No  JVD Cardiac: RRR, no murmurs, rubs, or gallops.  Respiratory:    Decreased breath sounds, crackles at the left lower lung  GI: Soft, nontender, non-distended  MS: No  edema; No deformity. Neuro:  Nonfocal  Psych: Normal affect   Labs    Chemistry Recent Labs  Lab 12/13/2018 0227  12/18/18 0426 12/18/18 0632 12/18/18 1112 12/19/18 0502  NA 145   < > 144 142 143 144  K 4.2   < > 4.2 4.3 4.5 4.1  CL 109  --  109  --   --  111  CO2 30  --  24  --   --  22  GLUCOSE 106*  --  100*  --   --  139*  BUN 13  --  15  --   --  24*  CREATININE 0.90  --  0.96  --   --  1.04  CALCIUM 8.5*  --  8.4*  --   --  8.5*  GFRNONAA >60  --  >60  --   --  >60  GFRAA >60  --  >60  --   --  >60  ANIONGAP 6  --  11  --   --  11   < > =  values in this interval not displayed.     Hematology Recent Labs  Lab 12/31/2018 0227  12/18/18 0426 12/18/18 0632 12/18/18 1112 12/19/18 0550  WBC 13.1*  --  13.5*  --   --  15.3*  RBC 3.90*  --  3.18*  --   --  3.00*  HGB 12.2*   < > 9.9* 10.5* 10.9* 9.4*  HCT 39.3   < > 32.7* 31.0* 32.0* 30.5*  MCV 100.8*  --  102.8*  --   --  101.7*  MCH 31.3  --  31.1  --   --  31.3  MCHC 31.0  --  30.3  --   --  30.8  RDW 12.3  --  12.4  --   --  12.7  PLT 437*  --  309  --   --  352   < > = values in this interval not displayed.    Cardiac EnzymesNo results for input(s): TROPONINI in the last 168 hours. No results for input(s): TROPIPOC in the last 168 hours.   BNPNo results for input(s): BNP, PROBNP in the last 168 hours.   DDimer No results for input(s): DDIMER in the last 168 hours.   Radiology    Dg Chest 1 View  Result Date: 12/26/2018 CLINICAL DATA:  78 year old male status post canceled operative procedure due to deterioration in the OR. Spontaneous left pneumothorax, COPD and emphysema with bleb rupture. Chemical pleurodesis on 12/07/2018. EXAM: CHEST  1 VIEW COMPARISON:  0745 hours today.  FINDINGS: Portable AP supine intraoperative view at 1352 hours. There appears to be an esophageal endoscope in place. There is a right IJ approach central line with tip at the level of the right innominate vein. Endotracheal tube with small electronic device projecting at the tip is above the carina. There are 2 pigtail type left chest tubes in place. The more cephalad tube is stable but might be disconnected in the soft tissues as before, uncertain. A 2nd tube is present at the left costophrenic angle. Small left apical pneumothorax is stable. There are intrabronchial occluded device is redemonstrated at the left hilum. Left chest wall subcutaneous gas has not significantly changed. Stable ventilation otherwise with large lung volumes. Stable cardiac size and mediastinal contours. IMPRESSION: 1. Intraoperative image with esophageal endoscope, ETT, and new right IJ central line. 2. Stable left chest tubes and small left pneumothorax. Left hilar intrabronchial occluded devices. 3. 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 Electronically Signed   By: Genevie Ann M.D.   On: 12/21/2018 17:17   Dg Chest Port 1 View  Result Date: 12/19/2018 CLINICAL DATA:  Ventilator support.  Respiratory failure. EXAM: PORTABLE CHEST 1 VIEW COMPARISON:  12/18/2018 FINDINGS: Endotracheal tube tip is 3 cm above the carina. Nasogastric tube enters the stomach. Right internal jugular central line tip is in the SVC in the upper portion. Left chest tubes remain in place. Tiny amount of pleural air at the left apex appears similar. Persistent atelectasis in the lower lobes persists, left worse than right. No dense consolidation. IMPRESSION: Lines and tubes appear unchanged. Small amount of air persists at the left pleural apex. Lower lobe infiltrates left more than right appear similar. Electronically Signed   By: Nelson Chimes M.D.   On: 12/19/2018 07:01   Portable Chest X-ray  Result Date: 12/18/2018 CLINICAL DATA:   Intubated patient. EXAM: PORTABLE CHEST 1 VIEW COMPARISON:  12/18/2018. FINDINGS: 1236 hours. Two views obtained. Interval re-intubation. Endotracheal tube tip is in the  mid trachea. Right IJ central venous catheter projects to the level of the upper SVC. Two small pigtail catheters in the left hemithorax are stable. There is a stable 5% left apical pneumothorax. Left chest wall soft tissue emphysema appears stable. Left basilar airspace disease has mildly progressed. Right basilar airspace opacity is unchanged. Left sided endobronchial valves are again noted. The heart size is stable. IMPRESSION: 1. Interval re-intubation. Endotracheal tube and additional support system components are well positioned. 2. No change in small left apical pneumothorax and soft tissue emphysema. 3. Slight worsening of left basilar airspace disease. Electronically Signed   By: Richardean Sale M.D.   On: 12/18/2018 12:58   Dg Chest Port 1 View  Result Date: 12/18/2018 CLINICAL DATA:  Pneumothorax, chest tube EXAM: PORTABLE CHEST 1 VIEW COMPARISON:  12/18/2018 FINDINGS: Pigtail chest tube in the left apex unchanged. Small left apical pneumothorax slightly larger. Left basilar pigtail chest tube also in place. Endobronchial valves noted in the left upper lobe unchanged in position. Right jugular central venous catheter tip in the proximal SVC unchanged. COPD.  Progression of bibasilar airspace disease IMPRESSION: Slight progression of left apical pneumothorax which remains small. Progression of bibasilar airspace disease, concerning for pneumonia Electronically Signed   By: Franchot Gallo M.D.   On: 12/18/2018 08:46   Dg Chest Port 1 View  Result Date: 12/18/2018 CLINICAL DATA:  Shortness of breath EXAM: PORTABLE CHEST 1 VIEW COMPARISON:  12/19/2018 FINDINGS: Right central venous catheter with tip over the upper SVC region. No pneumothorax. Postoperative changes in the left hilum. Two left chest tubes present with tiny residual left  apical pneumothorax. Subcutaneous emphysema along the left lateral chest wall. This is improving since previous study. Heart size and pulmonary vascularity are normal. Emphysematous changes and scattered fibrosis in the lungs. Developing infiltration or atelectasis in the right lung base. IMPRESSION: 1. Appliances appear in satisfactory position. 2. Two left chest tubes with tiny residual left pneumothorax. Improving subcutaneous emphysema along the left lateral chest wall. 3. Developing infiltration or atelectasis in the right lung base. 4. Emphysematous changes and fibrosis in the lungs. Electronically Signed   By: Lucienne Capers M.D.   On: 12/18/2018 01:37   Dg Abd Portable 1v  Result Date: 12/18/2018 CLINICAL DATA:  78 year old male status post gastric tube placement EXAM: PORTABLE ABDOMEN - 1 VIEW COMPARISON:  Chest x-ray 12/18/2018 FINDINGS: A gastric tube is present. The tip overlies the gastric antrum. Extensive left lower lobe interstitial and airspace opacities throughout the lung base. A pigtail thoracostomy tube is present in the periphery of the left lower lobe. The visualized bowel gas pattern is normal without evidence of obstruction. Rounded calcification in the left upper quadrant may represent a granuloma in the spleen or a splenic artery aneurysm. 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 IMPRESSION: 1. The tip of the nasogastric that the tip of the gastric tube projects over the gastric antrum. Electronically Signed   By: Jacqulynn Cadet M.D.   On: 12/18/2018 17:18    Cardiac Studies   ECHO   1. The left ventricle has hyperdynamic systolic function, with an ejection fraction of >65%. The cavity size was normal. Left ventricular diastolic function could not be evaluated.  2. The right ventricle has normal systolic function. The cavity was normal. There is no increase in right ventricular wall thickness.  3. Small pericardial effusion.  4. The  pericardial effusion is localized near the right ventricle and surrounding the apex.  Patient Profile     78 y.o. male with a hx of HTN and COPD who is being seen for the evaluation of episodic due to atrial flutter in the setting of spontaneous left pneumothorax at the request of Dr. Roxan Hockey  Assessment & Plan    RESPIRATORY FAILURE:  Reintubated after extubation following anesthesia for planned  VATS and PEA arrest.  Initially did not want to be reintubated but this decision was reversed.  Vent per CCM  TACHYCARDIA:   NSR.  No further flutter, fib.  We will follow as needed.  Continue to wean phenylephrine as tolerated  PEA ARREST:    Possibly related to anesthesia event/hypovolemia.   No acute cardiac event suggested.    PNEUMOTHORAX:  Possible VATS in the future.    For questions or updates, please contact [place] Please consult [email or web address] for contact info under Cardiology/STEMI.   Signed, Minus Breeding, MD  12/19/2018, 8:00 AM

## 2018-12-19 NOTE — Progress Notes (Signed)
2 Days Post-Op Procedure(s): Cancelled Procedure Subjective: Alert. Appropriately interactive. Pain 6/10  Objective: Vital signs in last 24 hours: Temp:  [97.8 F (36.6 C)-99.5 F (37.5 C)] 99.1 F (37.3 C) (03/14 0813) Pulse Rate:  [67-117] 82 (03/14 0730) Cardiac Rhythm: Normal sinus rhythm (03/14 0400) Resp:  [14-27] 16 (03/14 0730) BP: (75-144)/(42-109) 115/56 (03/14 0730) SpO2:  [92 %-100 %] 96 % (03/14 0801) FiO2 (%):  [40 %-100 %] 40 % (03/14 0801)  Hemodynamic parameters for last 24 hours:    Intake/Output from previous day: 03/13 0701 - 03/14 0700 In: 1762.4 [I.V.:1362.4; IV Piggyback:399.9] Out: 635 [Urine:555; Chest Tube:80] Intake/Output this shift: No intake/output data recorded.  General appearance: alert and no distress Neurologic: intact Heart: tachy and regular Lungs: wheezes left > right Abdomen: normal findings: soft, non-tender  Lab Results: Recent Labs    12/18/18 0426  12/18/18 1112 12/19/18 0550  WBC 13.5*  --   --  15.3*  HGB 9.9*   < > 10.9* 9.4*  HCT 32.7*   < > 32.0* 30.5*  PLT 309  --   --  352   < > = values in this interval not displayed.   BMET:  Recent Labs    12/18/18 0426  12/18/18 1112 12/19/18 0502  NA 144   < > 143 144  K 4.2   < > 4.5 4.1  CL 109  --   --  111  CO2 24  --   --  22  GLUCOSE 100*  --   --  139*  BUN 15  --   --  24*  CREATININE 0.96  --   --  1.04  CALCIUM 8.4*  --   --  8.5*   < > = values in this interval not displayed.    PT/INR:  Recent Labs    30-Dec-2018 0227  LABPROT 14.1  INR 1.1   ABG    Component Value Date/Time   PHART 7.342 (L) 12/18/2018 1224   HCO3 20.9 12/18/2018 1224   TCO2 26 12/18/2018 1112   ACIDBASEDEF 3.9 (H) 12/18/2018 1224   O2SAT 99.3 12/18/2018 1224   CBG (last 3)  Recent Labs    12/18/18 2346 12/19/18 0406 12/19/18 0805  GLUCAP 110* 122* 113*    Assessment/Plan: S/P Procedure(s): Cancelled Procedure -CV- in SR RESP- VDRF secondary to splinting from  rib fractures  RLL looks better this AM  Gas exchange Ok  Vent per CCM ID- on maxipime, afebrile RENAL- normal creatinine ENDO- CBG well controlled     LOS: 10 days    [name] 12/19/2018

## 2018-12-20 ENCOUNTER — Inpatient Hospital Stay (HOSPITAL_COMMUNITY): Payer: Medicare Other

## 2018-12-20 LAB — COMPREHENSIVE METABOLIC PANEL
ALT: 22 U/L (ref 0–44)
AST: 18 U/L (ref 15–41)
Albumin: 2.6 g/dL — ABNORMAL LOW (ref 3.5–5.0)
Alkaline Phosphatase: 80 U/L (ref 38–126)
Anion gap: 8 (ref 5–15)
BUN: 36 mg/dL — ABNORMAL HIGH (ref 8–23)
CALCIUM: 8.4 mg/dL — AB (ref 8.9–10.3)
CO2: 22 mmol/L (ref 22–32)
Chloride: 115 mmol/L — ABNORMAL HIGH (ref 98–111)
Creatinine, Ser: 1.1 mg/dL (ref 0.61–1.24)
GFR calc Af Amer: >60 mL/min (ref >60)
GFR calc non Af Amer: >60 mL/min (ref >60)
Glucose, Bld: 131 mg/dL — ABNORMAL HIGH (ref 70–99)
Potassium: 3.6 mmol/L (ref 3.5–5.1)
Sodium: 145 mmol/L (ref 135–145)
Total Bilirubin: 0.7 mg/dL (ref 0.3–1.2)
Total Protein: 5.6 g/dL — ABNORMAL LOW (ref 6.5–8.1)

## 2018-12-20 LAB — CBC
HCT: 32.8 % — ABNORMAL LOW (ref 39.0–52.0)
Hemoglobin: 9.9 g/dL — ABNORMAL LOW (ref 13.0–17.0)
MCH: 30.7 pg (ref 26.0–34.0)
MCHC: 30.2 g/dL (ref 30.0–36.0)
MCV: 101.9 fL — ABNORMAL HIGH (ref 80.0–100.0)
NRBC: 0.1 % (ref 0.0–0.2)
Platelets: 400 10*3/uL (ref 150–400)
RBC: 3.22 MIL/uL — ABNORMAL LOW (ref 4.22–5.81)
RDW: 12.9 % (ref 11.5–15.5)
WBC: 15.3 10*3/uL — ABNORMAL HIGH (ref 4.0–10.5)

## 2018-12-20 LAB — POCT I-STAT 7, (LYTES, BLD GAS, ICA,H+H)
Acid-base deficit: 3 mmol/L — ABNORMAL HIGH (ref 0.0–2.0)
Bicarbonate: 21.7 mmol/L (ref 20.0–28.0)
CALCIUM ION: 1.26 mmol/L (ref 1.15–1.40)
HCT: 28 % — ABNORMAL LOW (ref 39.0–52.0)
Hemoglobin: 9.5 g/dL — ABNORMAL LOW (ref 13.0–17.0)
O2 Saturation: 98 %
PCO2 ART: 36.9 mmHg (ref 32.0–48.0)
Patient temperature: 98.6
Potassium: 3.9 mmol/L (ref 3.5–5.1)
Sodium: 145 mmol/L (ref 135–145)
TCO2: 23 mmol/L (ref 22–32)
pH, Arterial: 7.378 (ref 7.350–7.450)
pO2, Arterial: 115 mmHg — ABNORMAL HIGH (ref 83.0–108.0)

## 2018-12-20 LAB — PROCALCITONIN: Procalcitonin: 0.23 ng/mL

## 2018-12-20 LAB — PHOSPHORUS: Phosphorus: 2.9 mg/dL (ref 2.5–4.6)

## 2018-12-20 LAB — GLUCOSE, CAPILLARY
Glucose-Capillary: 129 mg/dL — ABNORMAL HIGH (ref 70–99)
Glucose-Capillary: 132 mg/dL — ABNORMAL HIGH (ref 70–99)
Glucose-Capillary: 135 mg/dL — ABNORMAL HIGH (ref 70–99)
Glucose-Capillary: 137 mg/dL — ABNORMAL HIGH (ref 70–99)
Glucose-Capillary: 141 mg/dL — ABNORMAL HIGH (ref 70–99)
Glucose-Capillary: 146 mg/dL — ABNORMAL HIGH (ref 70–99)

## 2018-12-20 LAB — CULTURE, RESPIRATORY

## 2018-12-20 LAB — CULTURE, RESPIRATORY W GRAM STAIN

## 2018-12-20 LAB — MAGNESIUM: Magnesium: 1.9 mg/dL (ref 1.7–2.4)

## 2018-12-20 MED ORDER — ENOXAPARIN SODIUM 40 MG/0.4ML ~~LOC~~ SOLN
40.0000 mg | Freq: Every day | SUBCUTANEOUS | Status: DC
  Administered 2018-12-20 – 2018-12-27 (×8): 40 mg via SUBCUTANEOUS
  Filled 2018-12-20 (×8): qty 0.4

## 2018-12-20 MED ORDER — GUAIFENESIN 100 MG/5ML PO SOLN
5.0000 mL | ORAL | Status: AC
  Administered 2018-12-20 – 2018-12-23 (×18): 100 mg
  Filled 2018-12-20 (×17): qty 5

## 2018-12-20 MED ORDER — LORAZEPAM 2 MG/ML IJ SOLN
0.5000 mg | INTRAMUSCULAR | Status: DC | PRN
  Administered 2018-12-20 – 2018-12-29 (×9): 0.5 mg via INTRAVENOUS
  Filled 2018-12-20 (×12): qty 1

## 2018-12-20 MED ORDER — VITAL HIGH PROTEIN PO LIQD
1000.0000 mL | ORAL | Status: DC
  Administered 2018-12-20: 1000 mL

## 2018-12-20 NOTE — Plan of Care (Signed)
Patient sleepy, family updated.

## 2018-12-20 NOTE — Progress Notes (Signed)
Posterior chest tube removed per order. Sucking sound noted and copious amounts of serous drainage noted. A large Vaseline gauze applied next to skin, covered with 4 X 4 gauze and secured tightly with medipore tape.  02 sats 98%, remains on the vent. Will continue to monitor closely. Dr.Hendrickson made aware. He ordered a stat chest x-ray.

## 2018-12-20 NOTE — Progress Notes (Signed)
3 Days Post-Op Procedure(s): Cancelled Procedure Subjective: More sedated this AM  Objective: Vital signs in last 24 hours: Temp:  [97.6 F (36.4 C)-100.1 F (37.8 C)] 97.8 F (36.6 C) (03/15 0400) Pulse Rate:  [43-101] 67 (03/15 0715) Cardiac Rhythm: Normal sinus rhythm (03/15 0400) Resp:  [11-27] 17 (03/15 0715) BP: (87-177)/(41-118) 130/53 (03/15 0715) SpO2:  [89 %-100 %] 97 % (03/15 0715) FiO2 (%):  [40 %] 40 % (03/15 0314) Weight:  [76.6 kg] 76.6 kg (03/15 0500)  Hemodynamic parameters for last 24 hours:    Intake/Output from previous day: 03/14 0701 - 03/15 0700 In: 2414.4 [I.V.:958.5; NG/GT:444; IV Piggyback:1011.9] Out: 800 [Urine:730; Chest Tube:70] Intake/Output this shift: No intake/output data recorded.  General appearance: cooperative and no distress Neurologic: intact Heart: regular rate and rhythm Lungs: rhonchi bilaterally Abdomen: normal findings: soft, non-tender air leak from anterior tube, none from posterior tube  Lab Results: Recent Labs    12/19/18 0550 12/20/18 0316 12/20/18 0538  WBC 15.3*  --  15.3*  HGB 9.4* 9.5* 9.9*  HCT 30.5* 28.0* 32.8*  PLT 352  --  400   BMET:  Recent Labs    12/19/18 0502 12/20/18 0316 12/20/18 0538  NA 144 145 145  K 4.1 3.9 3.6  CL 111  --  115*  CO2 22  --  22  GLUCOSE 139*  --  131*  BUN 24*  --  36*  CREATININE 1.04  --  1.10  CALCIUM 8.5*  --  8.4*    PT/INR: No results for input(s): LABPROT, INR in the last 72 hours. ABG    Component Value Date/Time   PHART 7.378 12/20/2018 0316   HCO3 21.7 12/20/2018 0316   TCO2 23 12/20/2018 0316   ACIDBASEDEF 3.0 (H) 12/20/2018 0316   O2SAT 98.0 12/20/2018 0316   CBG (last 3)  Recent Labs    12/20/18 0009 12/20/18 0456 12/20/18 0753  GLUCAP 141* 132* 135*    Assessment/Plan: S/P Procedure(s): Cancelled Procedure -Spontaneous pneumothorax- still has air leak from anterior CT, has not had a leak from the posterior tube for several days- will  dc posterior CT Staph pneumonia- on vanco and maxipime Acute on chronic respiratory failure - failed quickly with attempt to wean yesterday. Vent per CCM Protein calorie malnutrition- moderate- on tube feeding SCD for DVT prophylaxis- will add enoxaparin Atrial fib/flutter- in SR   LOS: 11 days    [name] 12/20/2018

## 2018-12-20 NOTE — Progress Notes (Signed)
Brief Nutrition Note  Consult received for enteral/tube feeding initiation and management. Patient was last seen by a RD on 3/13. Note from that date indicates patient at risk for refeeding.   Adult Enteral Nutrition Protocol initiated. Full follow-up assessment to follow.  Admitting Dx: PNEUMOTHORAX  Body mass index is 24.23 kg/m. Pt meets criteria for normal weight based on current BMI.  Labs:  Recent Labs  Lab 12/18/18 0426  12/19/18 0502 12/19/18 1438 12/20/18 0316 12/20/18 0538  NA 144   < > 144  --  145 145  K 4.2   < > 4.1  --  3.9 3.6  CL 109  --  111  --   --  115*  CO2 24  --  22  --   --  22  BUN 15  --  24*  --   --  36*  CREATININE 0.96  --  1.04  --   --  1.10  CALCIUM 8.4*  --  8.5*  --   --  8.4*  MG  --   --  1.7 1.8  --  1.9  PHOS  --   --  2.3* 3.0  --  2.9  GLUCOSE 100*  --  139*  --   --  131*   < > = values in this interval not displayed.      Trenton Gammon, MS, RD, LDN, [facility] Inpatient Clinical Dietitian Pager # [phone] After hours/weekend pager # [phone]

## 2018-12-20 NOTE — Progress Notes (Signed)
Progress Note  Patient Name: Matthew Cuevas Date of Encounter: 12/20/2018  Primary Cardiologist:   No primary care provider on file.   Subjective   Runs of 12 minutes of SVT yesterday during attempted vent wean.  Intubated.  Sedated  Inpatient Medications    Scheduled Meds:  arformoterol  15 mcg Nebulization BID   aspirin  81 mg Per Tube Daily   budesonide (PULMICORT) nebulizer solution  0.5 mg Nebulization BID   chlorhexidine gluconate (MEDLINE KIT)  15 mL Mouth Rinse BID   enoxaparin (LOVENOX) injection  40 mg Subcutaneous Daily   famotidine (PEPCID) IV  20 mg Intravenous Q12H   feeding supplement (PRO-STAT SUGAR FREE 64)  30 mL Per Tube BID   feeding supplement (VITAL HIGH PROTEIN)  1,000 mL Per Tube Q24H   guaiFENesin  5 mL Per Tube Q4H   levalbuterol  0.63 mg Nebulization Q6H   mouth rinse  15 mL Mouth Rinse 10 times per day   metoprolol tartrate  25 mg Per Tube BID   montelukast  10 mg Oral QHS   Continuous Infusions:  sodium chloride     fentaNYL infusion INTRAVENOUS 175 mcg/hr (12/20/18 0700)   phenylephrine (NEO-SYNEPHRINE) Adult infusion 25 mcg/min (12/20/18 0700)   vancomycin Stopped (12/19/18 1828)   PRN Meds: Place/Maintain arterial line **AND** sodium chloride, fentaNYL (SUBLIMAZE) injection, fentaNYL (SUBLIMAZE) injection, levalbuterol, LORazepam, metoprolol tartrate, ondansetron **OR** [DISCONTINUED] ondansetron (ZOFRAN) IV   Vital Signs    Vitals:   12/20/18 0854 12/20/18 0856 12/20/18 0904 12/20/18 0947  BP:    (!) 108/52  Pulse:    81  Resp:      Temp:      TempSrc:      SpO2: 92% 91% 96%   Weight:      Height:        Intake/Output Summary (Last 24 hours) at 12/20/2018 1012 Last data filed at 12/20/2018 0700 Gross per 24 hour  Intake 2279.31 ml  Output 700 ml  Net 1579.31 ml   Filed Weights   01/01/2019 1119 12/18/18 0600 12/20/18 0500  Weight: 63.5 kg 73.4 kg 76.6 kg    Telemetry    Sinus tach, no fib, flutter -  Personally Reviewed  ECG    NA - Personally Reviewed  Physical Exam   GEN: No acute distress.   Neck: No  JVD Cardiac: RRR, no murmurs, rubs, or gallops.  Respiratory:    Decreased breath sounds, crackles at the left lower lung  GI: Soft, nontender, non-distended  MS: No  edema; No deformity. Neuro:  Nonfocal  Psych: Normal affect   Labs    Chemistry Recent Labs  Lab 12/18/18 0426  12/19/18 0502 12/20/18 0316 12/20/18 0538  NA 144   < > 144 145 145  K 4.2   < > 4.1 3.9 3.6  CL 109  --  111  --  115*  CO2 24  --  22  --  22  GLUCOSE 100*  --  139*  --  131*  BUN 15  --  24*  --  36*  CREATININE 0.96  --  1.04  --  1.10  CALCIUM 8.4*  --  8.5*  --  8.4*  PROT  --   --   --   --  5.6*  ALBUMIN  --   --   --   --  2.6*  AST  --   --   --   --  18  ALT  --   --   --   --  22  ALKPHOS  --   --   --   --  80  BILITOT  --   --   --   --  0.7  GFRNONAA >60  --  >60  --  >60  GFRAA >60  --  >60  --  >60  ANIONGAP 11  --  11  --  8   < > = values in this interval not displayed.     Hematology Recent Labs  Lab 12/18/18 0426  12/19/18 0550 12/20/18 0316 12/20/18 0538  WBC 13.5*  --  15.3*  --  15.3*  RBC 3.18*  --  3.00*  --  3.22*  HGB 9.9*   < > 9.4* 9.5* 9.9*  HCT 32.7*   < > 30.5* 28.0* 32.8*  MCV 102.8*  --  101.7*  --  101.9*  MCH 31.1  --  31.3  --  30.7  MCHC 30.3  --  30.8  --  30.2  RDW 12.4  --  12.7  --  12.9  PLT 309  --  352  --  400   < > = values in this interval not displayed.    Cardiac EnzymesNo results for input(s): TROPONINI in the last 168 hours. No results for input(s): TROPIPOC in the last 168 hours.   BNPNo results for input(s): BNP, PROBNP in the last 168 hours.   DDimer No results for input(s): DDIMER in the last 168 hours.   Radiology    Dg Abd 1 View  Result Date: 12/19/2018 CLINICAL DATA:  Orogastric tube placement. EXAM: ABDOMEN - 1 VIEW COMPARISON:  Yesterday. FINDINGS: Orogastric tube tip in the distal stomach and side  hole in the mid stomach. Normal bowel gas pattern. Decreased left basilar airspace opacity and stable mild right basilar airspace opacity. Mild levoconvex thoracolumbar scoliosis and degenerative changes. IMPRESSION: 1. Orogastric tube tip in the distal stomach and side hole in the mid stomach. 2. Decreased left basilar probable pneumonia. 3. Stable mild right basilar probable pneumonia. Electronically Signed   By: Claudie Revering M.D.   On: 12/19/2018 21:18   Dg Chest Port 1 View  Result Date: 12/20/2018 CLINICAL DATA:  Follow-up pneumothorax EXAM: PORTABLE CHEST 1 VIEW COMPARISON:  12/19/2018 and prior exams FINDINGS: Endotracheal tube, NG tube, RIGHT IJ central venous catheter and LEFT thoracostomy tubes are again noted. LEFT intrabronchial pulmonary valves again noted. Probable small LEFT pneumothorax appears unchanged. Bibasilar opacities/atelectasis again noted. IMPRESSION: Unchanged appearance of the chest. Unchanged small probable LEFT pneumothorax. Electronically Signed   By: Margarette Canada M.D.   On: 12/20/2018 09:42   Dg Chest Port 1 View  Result Date: 12/19/2018 CLINICAL DATA:  Ventilator support.  Respiratory failure. EXAM: PORTABLE CHEST 1 VIEW COMPARISON:  12/18/2018 FINDINGS: Endotracheal tube tip is 3 cm above the carina. Nasogastric tube enters the stomach. Right internal jugular central line tip is in the SVC in the upper portion. Left chest tubes remain in place. Tiny amount of pleural air at the left apex appears similar. Persistent atelectasis in the lower lobes persists, left worse than right. No dense consolidation. IMPRESSION: Lines and tubes appear unchanged. Small amount of air persists at the left pleural apex. Lower lobe infiltrates left more than right appear similar. Electronically Signed   By: Nelson Chimes M.D.   On: 12/19/2018 07:01   Portable Chest X-ray  Result Date: 12/18/2018 CLINICAL DATA:  Intubated patient. EXAM: PORTABLE CHEST 1 VIEW COMPARISON:  12/18/2018. FINDINGS:  1236 hours. Two views  obtained. Interval re-intubation. Endotracheal tube tip is in the mid trachea. Right IJ central venous catheter projects to the level of the upper SVC. Two small pigtail catheters in the left hemithorax are stable. There is a stable 5% left apical pneumothorax. Left chest wall soft tissue emphysema appears stable. Left basilar airspace disease has mildly progressed. Right basilar airspace opacity is unchanged. Left sided endobronchial valves are again noted. The heart size is stable. IMPRESSION: 1. Interval re-intubation. Endotracheal tube and additional support system components are well positioned. 2. No change in small left apical pneumothorax and soft tissue emphysema. 3. Slight worsening of left basilar airspace disease. Electronically Signed   By: Richardean Sale M.D.   On: 12/18/2018 12:58   Dg Abd Portable 1v  Result Date: 12/18/2018 CLINICAL DATA:  78 year old male status post gastric tube placement EXAM: PORTABLE ABDOMEN - 1 VIEW COMPARISON:  Chest x-ray 12/18/2018 FINDINGS: A gastric tube is present. The tip overlies the gastric antrum. Extensive left lower lobe interstitial and airspace opacities throughout the lung base. A pigtail thoracostomy tube is present in the periphery of the left lower lobe. The visualized bowel gas pattern is normal without evidence of obstruction. Rounded calcification in the left upper quadrant may represent a granuloma in the spleen or a splenic artery aneurysm. 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 IMPRESSION: 1. The tip of the nasogastric that the tip of the gastric tube projects over the gastric antrum. Electronically Signed   By: Jacqulynn Cadet M.D.   On: 12/18/2018 17:18    Cardiac Studies   ECHO   1. The left ventricle has hyperdynamic systolic function, with an ejection fraction of >65%. The cavity size was normal. Left ventricular diastolic function could not be evaluated.  2. The right ventricle  has normal systolic function. The cavity was normal. There is no increase in right ventricular wall thickness.  3. Small pericardial effusion.  4. The pericardial effusion is localized near the right ventricle and surrounding the apex.   Patient Profile     78 y.o. male with a hx of HTN and COPD who is being seen for the evaluation of episodic due to atrial flutter in the setting of spontaneous left pneumothorax at the request of Dr. Roxan Hockey  Assessment & Plan    RESPIRATORY FAILURE:  Reintubated after extubation following anesthesia for planned  VATS and PEA arrest.  Unable to vent wean yesterday.   TACHYCARDIA:   SVT yesterday.  I did not see flutter waves.   On beta blocker.  Note he remains on phenylephrine.  Wean as tolerated.   PEA ARREST:    Possibly related to anesthesia event/hypovolemia.   No acute cardiac event suggested.   PNEUMOTHORAX:  Possible VATS in the future.  Timing per Dr. Roxan Hockey.   For questions or updates, please contact [place] Please consult [email or web address] for contact info under Cardiology/STEMI.   Signed, Minus Breeding, MD  12/20/2018, 10:12 AM

## 2018-12-21 ENCOUNTER — Inpatient Hospital Stay (HOSPITAL_COMMUNITY): Payer: Medicare Other

## 2018-12-21 DIAGNOSIS — L8990 Pressure ulcer of unspecified site, unspecified stage: Secondary | ICD-10-CM

## 2018-12-21 LAB — GLUCOSE, CAPILLARY
GLUCOSE-CAPILLARY: 96 mg/dL (ref 70–99)
Glucose-Capillary: 103 mg/dL — ABNORMAL HIGH (ref 70–99)
Glucose-Capillary: 104 mg/dL — ABNORMAL HIGH (ref 70–99)
Glucose-Capillary: 127 mg/dL — ABNORMAL HIGH (ref 70–99)
Glucose-Capillary: 138 mg/dL — ABNORMAL HIGH (ref 70–99)
Glucose-Capillary: 91 mg/dL (ref 70–99)
Glucose-Capillary: 96 mg/dL (ref 70–99)

## 2018-12-21 LAB — BASIC METABOLIC PANEL
Anion gap: 7 (ref 5–15)
BUN: 43 mg/dL — ABNORMAL HIGH (ref 8–23)
CO2: 21 mmol/L — ABNORMAL LOW (ref 22–32)
Calcium: 8.2 mg/dL — ABNORMAL LOW (ref 8.9–10.3)
Chloride: 118 mmol/L — ABNORMAL HIGH (ref 98–111)
Creatinine, Ser: 1.07 mg/dL (ref 0.61–1.24)
GFR calc Af Amer: >60 mL/min (ref >60)
GLUCOSE: 110 mg/dL — AB (ref 70–99)
Potassium: 4 mmol/L (ref 3.5–5.1)
Sodium: 146 mmol/L — ABNORMAL HIGH (ref 135–145)

## 2018-12-21 LAB — CBC
HCT: 30.5 % — ABNORMAL LOW (ref 39.0–52.0)
Hemoglobin: 9.2 g/dL — ABNORMAL LOW (ref 13.0–17.0)
MCH: 30.6 pg (ref 26.0–34.0)
MCHC: 30.2 g/dL (ref 30.0–36.0)
MCV: 101.3 fL — ABNORMAL HIGH (ref 80.0–100.0)
Platelets: 416 10*3/uL — ABNORMAL HIGH (ref 150–400)
RBC: 3.01 MIL/uL — ABNORMAL LOW (ref 4.22–5.81)
RDW: 13.2 % (ref 11.5–15.5)
WBC: 16.3 10*3/uL — ABNORMAL HIGH (ref 4.0–10.5)
nRBC: 0.2 % (ref 0.0–0.2)

## 2018-12-21 MED ORDER — VITAL AF 1.2 CAL PO LIQD
1000.0000 mL | ORAL | Status: DC
  Administered 2018-12-21 – 2018-12-27 (×6): 1000 mL
  Filled 2018-12-21: qty 1000

## 2018-12-21 MED ORDER — SODIUM CHLORIDE 0.9% FLUSH: 10.0000 mL | INTRAVENOUS | Status: DC | PRN

## 2018-12-21 MED ORDER — INSULIN ASPART 100 UNIT/ML ~~LOC~~ SOLN
2.0000 [IU] | SUBCUTANEOUS | Status: DC
  Administered 2018-12-22 – 2018-12-23 (×2): 2 [IU] via SUBCUTANEOUS
  Administered 2018-12-24: 4 [IU] via SUBCUTANEOUS
  Administered 2018-12-25 – 2018-12-29 (×7): 2 [IU] via SUBCUTANEOUS

## 2018-12-21 MED ORDER — SODIUM CHLORIDE 0.9% FLUSH
10.0000 mL | Freq: Two times a day (BID) | INTRAVENOUS | Status: DC
  Administered 2018-12-21: 10 mL
  Administered 2018-12-21: 20 mL
  Administered 2018-12-21: 10 mL

## 2018-12-21 MED ORDER — METOPROLOL TARTRATE 25 MG/10 ML ORAL SUSPENSION
12.5000 mg | Freq: Two times a day (BID) | ORAL | Status: DC
  Administered 2018-12-21 – 2018-12-22 (×3): 12.5 mg
  Filled 2018-12-21 (×3): qty 5

## 2018-12-21 MED ORDER — SODIUM CHLORIDE 0.9 % IV BOLUS
1000.0000 mL | Freq: Once | INTRAVENOUS | Status: AC
  Administered 2018-12-22: 999 mL via INTRAVENOUS

## 2018-12-21 MED ORDER — CEFAZOLIN SODIUM-DEXTROSE 1-4 GM/50ML-% IV SOLN
1.0000 g | Freq: Three times a day (TID) | INTRAVENOUS | Status: DC
  Administered 2018-12-21 – 2018-12-26 (×14): 1 g via INTRAVENOUS
  Filled 2018-12-21 (×15): qty 50

## 2018-12-21 MED ORDER — CHLORHEXIDINE GLUCONATE 0.12 % MT SOLN
OROMUCOSAL | Status: AC
  Administered 2018-12-21: 15 mL via OROMUCOSAL
  Filled 2018-12-21: qty 15

## 2018-12-21 MED ORDER — CHLORHEXIDINE GLUCONATE CLOTH 2 % EX PADS
6.0000 | MEDICATED_PAD | Freq: Every day | CUTANEOUS | Status: DC
  Administered 2018-12-21 – 2018-12-27 (×6): 6 via TOPICAL

## 2018-12-21 NOTE — Progress Notes (Addendum)
Nutrition Follow-up  DOCUMENTATION CODES:   Non-severe (moderate) malnutrition in context of chronic illness  INTERVENTION:   Tube feeding via OG tube: - Vital AF 1.2 @ 50 ml/hr (1200 ml/day) - Pro-stat 30 ml BID - Free water per MD  Tube feeding regimen provides 1640 kcal, 120 grams of protein, and 973 ml of H2O (101% of needs).  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  Ongoing  GOAL:   Patient will meet greater than or equal to 90% of their needs  Met via TF  MONITOR:   Vent status, Labs, I & O's, Weight trends, TF tolerance, Skin  REASON FOR ASSESSMENT:   Consult Enteral/tube feeding initiation and management  ASSESSMENT:   78 yo Male with a history significant for bullous emphysema, COPD who had a bleb rupture and has been managed for a bronchopleural fistula since then came to the ICU s/p cardiac arrest.  3/11 - persistent air leak, CVTS following 3/12 - s/p VATS, PEAarrestafter anesthesia, extubated 3/13 - placed on BiPAP overnight 3/13 - intubated 3/15 - TF initiated, Adult Enteral Nutrition Protocol ordered  Per cardiothoracic surgery, "hope to proceed with left VATS later this week, depending on response to antibiotics."  Discussed pt with RN with during ICU rounds. Spoke with pt's wife at bedside. Explained purpose of TF.  Current TF per Adult ICU TF Protocol: Vital High Protein @ 40 ml/hr, Pro-stat 30 ml BID infusing at time of RD visit.  Will change TF regimen to better meet pt's needs. Will use EDW of 73.4 kg when estimating needs given positive fluid balance.  Patient is currently intubated on ventilator support. OG tube reaches the stomach per x-ray reading today. MV: 6.7 L/min Temp (24hrs), Avg:98.2 F (36.8 C), Min:97.6 F (36.4 C), Max:98.9 F (37.2 C) BP: 92/53 MAP: 65  Propofol: none Fentanyl: 10  ml/hr Neosynephrine: 30 ml/hr  Medications reviewed and include: Pepcid, IV antibiotics  Labs reviewed: sodium 146 (H), BUN 43 (H), hemoglobin 9.2 (L) CBG's: 104, 127, 138, 129, 137 x 24 hours  UOP: 795 ml x 24 hours CT output: 120 ml x 24 hours I/O's: +7.9 L since admit  Diet Order:   Diet Order            Diet NPO time specified  Diet effective now              EDUCATION NEEDS:   Not appropriate for education at this time  Skin:  Skin Assessment: Skin Integrity Issues: Stage I: coccyx 12/20/18  Last BM:  12/16/18  Height:   Ht Readings from Last 1 Encounters:  12/27/2018 5' 10" (1.778 m)    Weight:   Wt Readings from Last 1 Encounters:  12/21/18 77.5 kg   EDW: 73.4 kg  Ideal Body Weight:  75.5 kg  BMI:  Body mass index is 24.52 kg/m.  Estimated Nutritional Needs:   Kcal:  1625  Protein:  105-120 gm  Fluid:  per MD    Gaynell Face, MS, RD, LDN Inpatient Clinical Dietitian Pager: [phone] Weekend/After Hours: [phone]

## 2018-12-21 NOTE — Progress Notes (Signed)
eLink Physician-Brief Progress Note Patient Name: Matthew Cuevas DOB: 1941-01-09 MRN: [id number]   Date of Service  12/21/2018  HPI/Events of Note  Hypotension - BP = 88/46 with MAP = 50.  LVEF > 65%.  eICU Interventions  Will order: 1. Bolus with 0.9 NaCl 1 liter IV over 1 hour now.  2. Restart Phenylephrine IV infusion as needed.      Intervention Category Major Interventions: Hypotension - evaluation and management  Sommer,Steven Eugene 12/21/2018, 11:45 PM

## 2018-12-21 NOTE — Progress Notes (Signed)
CT surgery p.m. Rounds  Patient fairly alert on ventilator, breathing comfortably Air leak from left chest tube remains Chest tube well secured

## 2018-12-21 NOTE — Progress Notes (Signed)
Patient MAPs in the 50s while on sedation and only two peripheral IVs. Arsenio Loader, MD called and notified.Verbal order to not go over of Neo with PIVs, and give 1L NS fluid bolus.

## 2018-12-21 NOTE — Progress Notes (Signed)
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 Failed chemical pleurodesis and EBV.  Past Medical History  He,  has a past medical history of COPD (chronic obstructive pulmonary disease) (HCC) and HTN (hypertension).   Significant Hospital Events   PEA arrest at induction today 3/13. Re-intubated.  3/14: developed SVT during weaning attempt so aborted. Started on vanc for staph in sputum.   Consults:  TCTS  Procedures:  VATS cancelled  Significant Diagnostic Tests:  CT  Micro Data:  Sputum culture 3/13: Few staph aureus>>> Blood culture 3/6: This was negative Urine culture 3/6: Enterococcus faecalis 30,000 colony count  Antimicrobials:  Cefepime 3/13>> Vancomycin 3/14>>>  Interim history/subjective:  Awake and following commands, reports minimal chest pain from chest tubes. Unable to tolerate SBT below  10/5 Objective   Blood pressure (!) 92/53, pulse 72, temperature 97.6 F (36.4 C), temperature source Oral, resp. rate 18, height 5\' 10"  (1.778 m), weight 77.5 kg, SpO2 99 %.    Vent Mode: PRVC FiO2 (%):  [40 %-50 %] 40 % Set Rate:  [16 bmp] 16 bmp Vt Set:  [580 mL] 580 mL PEEP:  [5 cmH20] 5 cmH20 Plateau Pressure:  [17 cmH20-31 cmH20] 17 cmH20   Intake/Output Summary (Last 24 hours) at 12/21/2018 1106 Last data filed at 12/21/2018 1000 Gross per 24 hour  Intake 3162.44 ml  Output 990 ml  Net 2172.44 ml   Filed Weights   12/18/18 0600 12/20/18 0500 12/21/18 0500  Weight: 73.4 kg 76.6 kg 77.5 kg   Physical Exam Constitutional:      Appearance: He is ill-appearing.  HENT:     Head: Normocephalic and atraumatic.     Mouth/Throat:     Comments: ETT and OGT in place with no pressure ulceration. Eyes:     Extraocular Movements: Extraocular movements intact.   Conjunctiva/sclera: Conjunctivae normal.  Cardiovascular:     Rate and Rhythm: Normal rate and regular rhythm.     Heart sounds: Normal heart sounds.  Pulmonary:     Comments: Takes large tidal volume on PSV - appears uncomfortable Abdominal:     General: Abdomen is flat.     Palpations: Abdomen is soft.  Skin:    General: Skin is warm and dry.  Neurological:     General: No focal deficit present.     Mental Status: He is alert.      Resolved Hospital Problem list   PEA arrest (respiratory and anesthesia related 3/13)  Assessment & Plan:    Acute Hypoxic respiratory failure 2/2 AECOPD, RLL PNA vs ATX and persistent left PTX w/ BPF.  -Remove posterior chest tube. -Chest tube: Still with large air leak on the left -Appears anxious on SBT. Marland Kitchen  Plan Continuing full ventilator support today, we will treat the pneumonia today, and reassess for readiness to extubate in a.m. on 3/15  day #2 cefepime, adding vancomycin given preliminary respiratory cultures growing staph aureus Continue Brovana and budesonide PAD protocol RASS goal -2 Continue methylprednisone 40 mg every 12 VAP bundle Chest tube management per thoracic surgery, will eventually need thoracic surgery however discussing with surgical team plan will be to treat pneumonia for a few days first and likely go for VATS midweek Repeat chest x-ray in a.m. Follow-up pending cultures Trial  of anxiolysis.  Hypotension Plan Titrate neo-for mean arterial pressure greater than 65  SVT Seemingly secondary to work of breathing Plan Resume full ventilator support PRN Lopressor  At risk for fluid and electrolyte balance Plan Trend chemistries  Anemia without evidence of bleeding Plan Trend CBC Transfuse for hemoglobin less than 7  Best practice:  Diet: NPO recommended at present Pain/Anxiety/Delirium protocol (if indicated): Precedex for RASS zero VAP protocol (if indicated): 3/15 DVT prophylaxis: SCD GI  prophylaxis: H2B if no diet Glucose control: glycemic control model Mobility: as tolerated oob Code Status: FC Family Communication: d/w wife at bedside Disposition: He remains critically ill secondary to pneumonia superimposed on underlying severe lung disease and 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  He actually looks ready to extubate, and I was discussing this with thoracic surgery while he was on spontaneous breathing trial.  Unfortunately during our discussion he spontaneously developed worsening shortness of breath, this was approximately 8 minutes into spontaneous breathing trial with pressure support of 5 and subsequently developed SVT.  Because of this weaning was aborted, the plan at this point will be to continue full ventilator support with titration of ventilator support as indicated, will add vancomycin to address staph aureus growing in sputum, and re-assess for readiness to extubate on 3/15.  After speaking with thoracic surgery and the plan will be to continue to treat pneumonia for the next few days, then reassess midweek before video-assisted thoracotomy and correction of ongoing bronchopleural fistula   Total critical care time: 35 min including chart data review, examination of patient, multidisciplinary rounds, and frequent assessment and modification of ventilator therapy and sedation, adjustment of chest tubes.Lynnell Catalan, MD Staff Critical Care Physician [facility] Pager: [phone] Off hours: [phone]  12/21/2018, 11:06 AM

## 2018-12-21 NOTE — Progress Notes (Signed)
Progress Note  Patient Name: FILMORE MOLYNEUX Date of Encounter: 12/21/2018  Primary Cardiologist: No primary care provider on file.   Subjective   Intubated, sedated  Inpatient Medications    Scheduled Meds:  arformoterol  15 mcg Nebulization BID   aspirin  81 mg Per Tube Daily   budesonide (PULMICORT) nebulizer solution  0.5 mg Nebulization BID   chlorhexidine gluconate (MEDLINE KIT)  15 mL Mouth Rinse BID   Chlorhexidine Gluconate Cloth  6 each Topical Daily   enoxaparin (LOVENOX) injection  40 mg Subcutaneous Daily   famotidine (PEPCID) IV  20 mg Intravenous Q12H   feeding supplement (PRO-STAT SUGAR FREE 64)  30 mL Per Tube BID   feeding supplement (VITAL HIGH PROTEIN)  1,000 mL Per Tube Q24H   guaiFENesin  5 mL Per Tube Q4H   levalbuterol  0.63 mg Nebulization Q6H   mouth rinse  15 mL Mouth Rinse 10 times per day   metoprolol tartrate  25 mg Per Tube BID   montelukast  10 mg Oral QHS   sodium chloride flush  10-40 mL Intracatheter Q12H   Continuous Infusions:  sodium chloride     fentaNYL infusion INTRAVENOUS 250 mcg/hr (12/21/18 0700)   phenylephrine (NEO-SYNEPHRINE) Adult infusion 30 mcg/min (12/21/18 0700)   vancomycin Stopped (12/20/18 1807)   PRN Meds: Place/Maintain arterial line **AND** sodium chloride, fentaNYL (SUBLIMAZE) injection, fentaNYL (SUBLIMAZE) injection, levalbuterol, LORazepam, metoprolol tartrate, ondansetron **OR** [DISCONTINUED] ondansetron (ZOFRAN) IV, sodium chloride flush   Vital Signs    Vitals:   12/21/18 0740 12/21/18 0743 12/21/18 0745 12/21/18 0750  BP:   (!) 110/52 (!) 110/52  Pulse:   (!) 58 (!) 58  Resp:   17 20  Temp:      TempSrc:      SpO2: 100% 100% 94% 100%  Weight:      Height:        Intake/Output Summary (Last 24 hours) at 12/21/2018 0822 Last data filed at 12/21/2018 0700 Gross per 24 hour  Intake 3249.97 ml  Output 915 ml  Net 2334.97 ml   Last 3 Weights 12/21/2018 12/20/2018 12/18/2018    Weight (lbs) 170 lb 13.7 oz 168 lb 14 oz 161 lb 13.1 oz  Weight (kg) 77.5 kg 76.6 kg 73.4 kg      Telemetry    NSR. Episode of SVT at 0500 this morning - Personally Reviewed   Physical Exam  Awake, follows commands, sedated on vent GEN: No acute distress.   Neck: No JVD Cardiac: RRR, no murmurs, rubs, or gallops.  Respiratory: coarse BS bilaterally. GI: Soft, nontender, non-distended  MS: No edema; No deformity. Neuro:  Nonfocal  Psych: Normal affect   Labs    Chemistry Recent Labs  Lab 12/19/18 0502 12/20/18 0316 12/20/18 0538 12/21/18 0609  NA 144 145 145 146*  K 4.1 3.9 3.6 4.0  CL 111  --  115* 118*  CO2 22  --  22 21*  GLUCOSE 139*  --  131* 110*  BUN 24*  --  36* 43*  CREATININE 1.04  --  1.10 1.07  CALCIUM 8.5*  --  8.4* 8.2*  PROT  --   --  5.6*  --   ALBUMIN  --   --  2.6*  --   AST  --   --  18  --   ALT  --   --  22  --   ALKPHOS  --   --  80  --  BILITOT  --   --  0.7  --   GFRNONAA >60  --  >60 >60  GFRAA >60  --  >60 >60  ANIONGAP 11  --  8 7     Hematology Recent Labs  Lab 12/19/18 0550 12/20/18 0316 12/20/18 0538 12/21/18 0609  WBC 15.3*  --  15.3* 16.3*  RBC 3.00*  --  3.22* 3.01*  HGB 9.4* 9.5* 9.9* 9.2*  HCT 30.5* 28.0* 32.8* 30.5*  MCV 101.7*  --  101.9* 101.3*  MCH 31.3  --  30.7 30.6  MCHC 30.8  --  30.2 30.2  RDW 12.7  --  12.9 13.2  PLT 352  --  400 416*    Cardiac EnzymesNo results for input(s): TROPONINI in the last 168 hours. No results for input(s): TROPIPOC in the last 168 hours.   BNPNo results for input(s): BNP, PROBNP in the last 168 hours.   DDimer No results for input(s): DDIMER in the last 168 hours.   Radiology    Dg Abd 1 View  Result Date: 12/19/2018 CLINICAL DATA:  Orogastric tube placement. EXAM: ABDOMEN - 1 VIEW COMPARISON:  Yesterday. FINDINGS: Orogastric tube tip in the distal stomach and side hole in the mid stomach. Normal bowel gas pattern. Decreased left basilar airspace opacity and stable  mild right basilar airspace opacity. Mild levoconvex thoracolumbar scoliosis and degenerative changes. IMPRESSION: 1. Orogastric tube tip in the distal stomach and side hole in the mid stomach. 2. Decreased left basilar probable pneumonia. 3. Stable mild right basilar probable pneumonia. Electronically Signed   By: Claudie Revering M.D.   On: 12/19/2018 21:18   Dg Chest Port 1 View  Result Date: 12/21/2018 CLINICAL DATA:  Spontaneous pneumothorax EXAM: PORTABLE CHEST 1 VIEW COMPARISON:  Yesterday FINDINGS: Endotracheal tube tip between the clavicular heads and carina. The orogastric tube at least reaches the stomach. Continued shortening of right IJ line with tip near the IJ subclavian confluence. Left-sided chest tube in stable position. Increasing chest wall emphysema. Endobronchial valves on the left. Small left apical pneumothorax. Interstitial and airspace opacity throughout the left lung and at the right base. IMPRESSION: 1. Shorter right IJ catheter with tip near the IJ subclavian confluence. 2. Increased left chest wall emphysema. Unchanged small left apical pneumothorax. 3. History of pneumonia with stable airspace disease. Electronically Signed   By: Monte Fantasia M.D.   On: 12/21/2018 07:17   Dg Chest Port 1 View  Result Date: 12/20/2018 CLINICAL DATA:  Chest tube removal EXAM: PORTABLE CHEST 1 VIEW COMPARISON:  Chest radiograph 12/20/2018 FINDINGS: ET tube terminates in the mid trachea. Enteric tube courses inferior to the diaphragm. Right IJ sheath projects over the right lung apex. Left chest tube similar position. Interval removal chest tube in the left lung base. Similar-appearing left-greater-than-right mid lower lung patchy consolidative opacities. Small left pleural effusion. No definite pneumothorax. IMPRESSION: Interval removal 1 of the 2 left chest tubes. No definite left-sided pneumothorax. ET tube mid trachea. Electronically Signed   By: Lovey Newcomer M.D.   On: 12/20/2018 11:29   Dg  Chest Port 1 View  Result Date: 12/20/2018 CLINICAL DATA:  Follow-up pneumothorax EXAM: PORTABLE CHEST 1 VIEW COMPARISON:  12/19/2018 and prior exams FINDINGS: Endotracheal tube, NG tube, RIGHT IJ central venous catheter and LEFT thoracostomy tubes are again noted. LEFT intrabronchial pulmonary valves again noted. Probable small LEFT pneumothorax appears unchanged. Bibasilar opacities/atelectasis again noted. IMPRESSION: Unchanged appearance of the chest. Unchanged small probable LEFT pneumothorax. Electronically Signed  By: Margarette Canada M.D.   On: 12/20/2018 09:42    Cardiac Studies   Echo: IMPRESSIONS    1. The left ventricle has hyperdynamic systolic function, with an ejection fraction of >65%. The cavity size was normal. Left ventricular diastolic function could not be evaluated.  2. The right ventricle has normal systolic function. The cavity was normal. There is no increase in right ventricular wall thickness.  3. Small pericardial effusion.  4. The pericardial effusion is localized near the right ventricle and surrounding the apex.  FINDINGS  Left Ventricle: The left ventricle has hyperdynamic systolic function, with an ejection fraction of >65%. The cavity size was normal. There is no increase in left ventricular wall thickness. Left ventricular diastolic function could not be evaluated. Right Ventricle: The right ventricle has normal systolic function. The cavity was normal. There is no increase in right ventricular wall thickness. Left Atrium: left atrial size was normal in size Right Atrium: right atrial size was normal in size. Right atrial pressure is estimated at 3 mmHg. Interatrial Septum: No atrial level shunt detected by color flow Doppler. Pericardium: A small pericardial effusion is present. The pericardial effusion is localized near the right ventricle and surrounding the apex. The pericardial effusion appears to contain fibrous material. There is no evidence of cardiac  tamponade. Mitral Valve: The mitral valve is normal in structure. Mitral valve regurgitation was not assessed by color flow Doppler. Tricuspid Valve: The tricuspid valve is normal in structure. Tricuspid valve regurgitation is mild by color flow Doppler. Aortic Valve: The aortic valve is normal in structure. Aortic valve regurgitation was not visualized by color flow Doppler. Pulmonic Valve: The pulmonic valve was grossly normal. Pulmonic valve regurgitation was not assessed by color flow Doppler. Venous: The inferior vena cava is normal in size with greater than 50% respiratory variability.  Patient Profile     78 y.o. male with spontaneous left pneumothorax who has had paroxysmal SVT possible atrial flutter  Assessment & Plan    1.  Supraventricular tachycardia: continue metoprolol at low dose as tolerated. If SVT increases will start IV amiodarone. Not a good long term option with his lung disease but might be only way to control his rhythm while he is sick in the hospital. He continues on neo for BP support. Will follow with you.  2.  Pneumothorax with respiratory failure: Management per Dr. Roxan Hockey  For questions or updates, please contact [facility] Please consult [email or web address] for contact info under        Signed, Sherren Mocha, MD  12/21/2018, 8:22 AM

## 2018-12-21 NOTE — Progress Notes (Signed)
NAME:  Matthew Cuevas, MRN:  [id number], DOB:  [birth year], LOS: 12 ADMISSION DATE:  12/07/2018, CONSULTATION DATE:  12/18/2018 REFERRING MD:  Jonathon Bellows, CHIEF COMPLAINT:  Shortness of breath  Brief History   78 yo WM COPD with a BPF s/p bleb rupture 2/23. Failed chemical pleurodesis and EBV.  Past Medical History  He,  has a past medical history of COPD (chronic obstructive pulmonary disease) (HCC) and HTN (hypertension).   Significant Hospital Events   PEA arrest at induction today 3/13. Re-intubated.  3/14: developed SVT during weaning attempt so aborted. Started on vanc for staph in sputum.   Consults:    Procedures:  VATS cancelled  Significant Diagnostic Tests:  CT  Micro Data:  Sputum culture 3/13: Few staph aureus>>>pan sens  Blood culture 3/6: This was negative Urine culture 3/6: Enterococcus faecalis 30,000 colony count  Antimicrobials:  Cefepime 3/13>>3/16 Ancef 3/16>>> Vancomycin 3/14>>>3/16  Interim history/subjective:  O2 needs increased overnight - now on 60% Remains on low dose neo  Had episode SVT during SBT again this am.  But now NSR 60's-70's  Objective   Blood pressure (!) 110/52, pulse (!) 58, temperature 97.6 F (36.4 C), temperature source Oral, resp. rate 20, height 5\' 10"  (1.778 m), weight 77.5 kg, SpO2 100 %.    Vent Mode: PRVC FiO2 (%):  [40 %-50 %] 50 % Set Rate:  [16 bmp] 16 bmp Vt Set:  [580 mL] 580 mL PEEP:  [5 cmH20] 5 cmH20 Plateau Pressure:  [18 cmH20-31 cmH20] 28 cmH20   Intake/Output Summary (Last 24 hours) at 12/21/2018 0944 Last data filed at 12/21/2018 0700 Gross per 24 hour  Intake 3155.37 ml  Output 915 ml  Net 2240.37 ml   Filed Weights   12/18/18 0600 12/20/18 0500 12/21/18 0500  Weight: 73.4 kg 76.6 kg 77.5 kg      Resolved Hospital Problem list   PEA arrest (respiratory and anesthesia related 3/13)  Assessment & Plan:    Acute Hypoxic respiratory failure 2/2 AECOPD, RLL PNA vs ATX and persistent left PTX w/  BPF.  -Chest tube: Still with large air leak on the left -Developed SVT during spontaneous breathing trial so this was aborted Plan Vent support - 8cc/kg  F/u CXR  F/u ABG D3 abx - will change to ancef per pharmacy for pan sens staph   Continue brovana and budesonide  Continue solumedrol 40mg  q12  VAP bundle  CT management per CVTS -- will need VATS at some point - hope to proceed later this week  F/u CXR  Hold off on further attempts at vent wean for today   Hypotension - likely exacerbated by sedation  Plan Titrate neo-for mean arterial pressure greater than 65 Decrease lopressor to 12.5mg  BID   SVT - Seemingly secondary to work of breathing Plan Continue full ventilator support Cardiology following  Continue low dose scheduled B blocker - decrease for now as above  Hold off on further vent wean today   At risk for fluid and electrolyte balance Plan F/u chem   Anemia without evidence of bleeding Plan F/u CBC  SCD's  Monitor for s/s bleeding   Best practice:  Diet: NPO recommended at present Pain/Anxiety/Delirium protocol (if indicated): fentanyl gtt  VAP protocol (if indicated): 3/15 DVT prophylaxis: SCD GI prophylaxis: H2B  Glucose control: SSI  Mobility: as tolerated oob Code Status: FC Family Communication: d/w wife at length at bedside 3/16 Disposition: ICU   CC time 35 mins    [employer],  NP 12/21/2018  9:44 AM Pager: [phone] or [phone]

## 2018-12-21 NOTE — Progress Notes (Signed)
4 Days Post-Op Procedure(s): Cancelled Procedure Subjective: Intubated and sedated  Objective: Vital signs in last 24 hours: Temp:  [97.6 F (36.4 C)-98.9 F (37.2 C)] 97.6 F (36.4 C) (03/16 0720) Pulse Rate:  [49-145] 60 (03/16 0715) Cardiac Rhythm: Normal sinus rhythm (03/16 0400) Resp:  [12-21] 16 (03/16 0715) BP: (92-147)/(41-105) 108/53 (03/16 0715) SpO2:  [89 %-100 %] 99 % (03/16 0715) FiO2 (%):  [40 %] 40 % (03/16 0400) Weight:  [77.5 kg] 77.5 kg (03/16 0500)  Hemodynamic parameters for last 24 hours:    Intake/Output from previous day: 03/15 0701 - 03/16 0700 In: 3335.1 [I.V.:1305.1; NG/GT:1310; IV Piggyback:720] Out: 915 [Urine:795; Chest Tube:120] Intake/Output this shift: No intake/output data recorded.  General appearance: cooperative and no distress Neurologic: nonfocal Heart: brady, regular Lungs: rhonchi bilaterally + air leak  Lab Results: Recent Labs    12/20/18 0538 12/21/18 0609  WBC 15.3* 16.3*  HGB 9.9* 9.2*  HCT 32.8* 30.5*  PLT 400 416*   BMET:  Recent Labs    12/20/18 0538 12/21/18 0609  NA 145 146*  K 3.6 4.0  CL 115* 118*  CO2 22 21*  GLUCOSE 131* 110*  BUN 36* 43*  CREATININE 1.10 1.07  CALCIUM 8.4* 8.2*    PT/INR: No results for input(s): LABPROT, INR in the last 72 hours. ABG    Component Value Date/Time   PHART 7.378 12/20/2018 0316   HCO3 21.7 12/20/2018 0316   TCO2 23 12/20/2018 0316   ACIDBASEDEF 3.0 (H) 12/20/2018 0316   O2SAT 98.0 12/20/2018 0316   CBG (last 3)  Recent Labs    12/21/18 0015 12/21/18 0419 12/21/18 0721  GLUCAP 138* 127* 104*    Assessment/Plan: S/P Procedure(s): Cancelled Procedure - Remains on vent. FiO2 had to be increased to 60% over night- likely secondary to mucous plugging. Increase in SQ emphysema on left, CT functional, small apical pneumothorax Staph- pneumonia- day 3 vanco today Hope to proceed with left VATS later this week, depending on response to antibitoics   LOS: 12  days    Matthew Cuevas 12/21/2018

## 2018-12-21 NOTE — Progress Notes (Signed)
Patient right IJ central line removed. Line was not in place. ELink cameraed in. Okay to remove. Abby, RN to consult for another central line placement. IV team consult ordered.

## 2018-12-22 ENCOUNTER — Inpatient Hospital Stay (HOSPITAL_COMMUNITY): Payer: Medicare Other

## 2018-12-22 DIAGNOSIS — J15211 Pneumonia due to Methicillin susceptible Staphylococcus aureus: Secondary | ICD-10-CM

## 2018-12-22 DIAGNOSIS — J9383 Other pneumothorax: Principal | ICD-10-CM

## 2018-12-22 DIAGNOSIS — I9589 Other hypotension: Secondary | ICD-10-CM

## 2018-12-22 DIAGNOSIS — J9382 Other air leak: Secondary | ICD-10-CM

## 2018-12-22 DIAGNOSIS — E861 Hypovolemia: Secondary | ICD-10-CM

## 2018-12-22 LAB — GLUCOSE, CAPILLARY
Glucose-Capillary: 124 mg/dL — ABNORMAL HIGH (ref 70–99)
Glucose-Capillary: 90 mg/dL (ref 70–99)
Glucose-Capillary: 104 mg/dL — ABNORMAL HIGH (ref 70–99)
Glucose-Capillary: 96 mg/dL (ref 70–99)

## 2018-12-22 LAB — CBC
HCT: 29.1 % — ABNORMAL LOW (ref 39.0–52.0)
Hemoglobin: 9 g/dL — ABNORMAL LOW (ref 13.0–17.0)
MCH: 31.6 pg (ref 26.0–34.0)
MCHC: 30.9 g/dL (ref 30.0–36.0)
MCV: 102.1 fL — ABNORMAL HIGH (ref 80.0–100.0)
NRBC: 0 % (ref 0.0–0.2)
Platelets: 381 10*3/uL (ref 150–400)
RBC: 2.85 MIL/uL — AB (ref 4.22–5.81)
RDW: 13.6 % (ref 11.5–15.5)
WBC: 13.8 10*3/uL — ABNORMAL HIGH (ref 4.0–10.5)

## 2018-12-22 LAB — MAGNESIUM: Magnesium: 2 mg/dL (ref 1.7–2.4)

## 2018-12-22 LAB — BASIC METABOLIC PANEL
ANION GAP: 6 (ref 5–15)
BUN: 45 mg/dL — ABNORMAL HIGH (ref 8–23)
CO2: 23 mmol/L (ref 22–32)
Calcium: 8.2 mg/dL — ABNORMAL LOW (ref 8.9–10.3)
Chloride: 117 mmol/L — ABNORMAL HIGH (ref 98–111)
Creatinine, Ser: 0.98 mg/dL (ref 0.61–1.24)
GFR calc non Af Amer: >60 mL/min (ref >60)
Glucose, Bld: 111 mg/dL — ABNORMAL HIGH (ref 70–99)
Potassium: 4.1 mmol/L (ref 3.5–5.1)
Sodium: 146 mmol/L — ABNORMAL HIGH (ref 135–145)

## 2018-12-22 LAB — PHOSPHORUS: Phosphorus: 2.8 mg/dL (ref 2.5–4.6)

## 2018-12-22 MED ORDER — FAMOTIDINE 20 MG PO TABS
20.0000 mg | ORAL_TABLET | Freq: Two times a day (BID) | ORAL | Status: DC
  Administered 2018-12-22 – 2018-12-28 (×13): 20 mg
  Filled 2018-12-22 (×13): qty 1

## 2018-12-22 MED ORDER — MONTELUKAST SODIUM 10 MG PO TABS
10.0000 mg | ORAL_TABLET | Freq: Every day | ORAL | Status: DC
  Administered 2018-12-22 – 2018-12-28 (×6): 10 mg
  Filled 2018-12-22 (×6): qty 1

## 2018-12-22 MED ORDER — SENNOSIDES-DOCUSATE SODIUM 8.6-50 MG PO TABS
1.0000 | ORAL_TABLET | Freq: Every day | ORAL | Status: DC
  Administered 2018-12-22: 1
  Filled 2018-12-22: qty 1

## 2018-12-22 NOTE — Progress Notes (Signed)
5 Days Post-Op Procedure(s): Cancelled Procedure Subjective: Intubated, alert and coperative  Objective: Vital signs in last 24 hours: Temp:  [98.2 F (36.8 C)-99.2 F (37.3 C)] 99.2 F (37.3 C) (03/17 0415) Pulse Rate:  [56-146] 68 (03/17 0715) Cardiac Rhythm: Normal sinus rhythm (03/17 0400) Resp:  [7-25] 16 (03/17 0715) BP: (76-131)/(35-97) 106/49 (03/17 0715) SpO2:  [90 %-100 %] 98 % (03/17 0715) FiO2 (%):  [40 %-50 %] 40 % (03/17 0400) Weight:  [77.9 kg] 77.9 kg (03/17 0530)  Hemodynamic parameters for last 24 hours:    Intake/Output from previous day: 03/16 0701 - 03/17 0700 In: 3353.7 [I.V.:903.6; NG/GT:1085; IV Piggyback:1365.1] Out: 1130 [Urine:970; Chest Tube:160] Intake/Output this shift: No intake/output data recorded.  General appearance: alert, cooperative and anxious Neurologic: intact Heart: irregularly irregular rhythm Lungs: wheezes bilaterally air leak uncahanged  Lab Results: Recent Labs    12/21/18 0609 12/22/18 0302  WBC 16.3* 13.8*  HGB 9.2* 9.0*  HCT 30.5* 29.1*  PLT 416* 381   BMET:  Recent Labs    12/21/18 0609 12/22/18 0302  NA 146* 146*  K 4.0 4.1  CL 118* 117*  CO2 21* 23  GLUCOSE 110* 111*  BUN 43* 45*  CREATININE 1.07 0.98  CALCIUM 8.2* 8.2*    PT/INR: No results for input(s): LABPROT, INR in the last 72 hours. ABG    Component Value Date/Time   PHART 7.378 12/20/2018 0316   HCO3 21.7 12/20/2018 0316   TCO2 23 12/20/2018 0316   ACIDBASEDEF 3.0 (H) 12/20/2018 0316   O2SAT 98.0 12/20/2018 0316   CBG (last 3)  Recent Labs    12/21/18 2020 12/21/18 2351 12/22/18 0352  GLUCAP 91 96 124*    Assessment/Plan: S/P Procedure(s): Cancelled Procedure -VDRF- vent per CCM, has been unable to wean so far Staph pneumonia- on Ancef, WBC trending down, afebrile Pneumothorax/ prolonged air leak unchanged- probably OR later this week Nutrition on Vital TF Enoxaparin + SCD for DVT prophylaxis    LOS: 13 days     [name] 12/22/2018

## 2018-12-22 NOTE — Progress Notes (Signed)
NAME:  Matthew Cuevas, MRN:  [id number], DOB:  April 24, 1941, LOS: 13 ADMISSION DATE:  12/26/2018, CONSULTATION DATE:  12/18/2018 REFERRING MD:  Jonathon Bellows, CHIEF COMPLAINT:  Shortness of breath  Brief History   78 yo WM COPD with a BPF s/p bleb rupture 2/23. Failed chemical pleurodesis and EBV.  Past Medical History  He,  has a past medical history of COPD (chronic obstructive pulmonary disease) (HCC) and HTN (hypertension).   Significant Hospital Events   PEA arrest at induction today 3/13. Re-intubated.  3/14: developed SVT during weaning attempt so aborted. Started on vanc for staph in sputum.   Consults:    Procedures:  VATS cancelled  Significant Diagnostic Tests:  CT  Micro Data:  Sputum culture 3/13: Few staph aureus>>>pan sens  Blood culture 3/6: This was negative Urine culture 3/6: Enterococcus faecalis 30,000 colony count  Antimicrobials:  Cefepime 3/13>>3/16 Ancef 3/16>>> Vancomycin 3/14>>>3/16  Interim history/subjective:  O2 needs increased overnight - now on 60% Remains on low dose neo  Had episode SVT during SBT again this am.  But now NSR 60's-70's  Objective   Blood pressure 119/66, pulse 77, temperature 98.5 F (36.9 C), temperature source Oral, resp. rate 19, height 5\' 10"  (1.778 m), weight 77.9 kg, SpO2 98 %.    Vent Mode: PRVC FiO2 (%):  [40 %] 40 % Set Rate:  [16 bmp] 16 bmp Vt Set:  [580 mL] 580 mL PEEP:  [5 cmH20] 5 cmH20 Pressure Support:  [10 cmH20] 10 cmH20 Plateau Pressure:  [19 cmH20-24 cmH20] 22 cmH20   Intake/Output Summary (Last 24 hours) at 12/22/2018 1342 Last data filed at 12/22/2018 1300 Gross per 24 hour  Intake 3149.32 ml  Output 1565 ml  Net 1584.32 ml   Filed Weights   12/20/18 0500 12/21/18 0500 12/22/18 0530  Weight: 76.6 kg 77.5 kg 77.9 kg      Resolved Hospital Problem list   PEA arrest (respiratory and anesthesia related 3/13)  Assessment & Plan:    Acute Hypoxic respiratory failure 2/2 AECOPD, RLL PNA vs ATX and  persistent left PTX w/ BPF.  -Chest tube: Persistent Left air leak  -MSSA PNA  -VATs later this week with CVTS  -CXR 3/17 revealing unchanged L PTX, unchanged PNA opacity  Plan -Continue mechanical ventilatory support -Adjust PEEP/FiO2 for SpO2  -PSV/CPAP as tolerated. SBTs limited by runs of SVT. Will need close monitoring during vent weans to monitor for dysrhythmias  -Continue Ancef for MSSA PNA  -Continue brovana and budesonide  -Continue solumedrol 40mg  q12  -VAP bundle  -AM CXR  -CT management per CVTS -- will need VATS at some point - hope to proceed later this week   Hypotension  -likely exacerbated by sedating medications Plan -Continue Neo, titrate for MAP > 65 -Wean sedation as tolerated  SVT - Seemingly secondary to work of breathing Plan -SVT runs occurring with vent weaning -Continue mechanical ventilatory support -Cardiology following, appreciate recs  -Continue low dose metoprolol. PRN metop available  -Amiodarone not favored in this case as his SVT is brief. If periods of dysrhythmia become more frequent, may be a consideration    At risk for fluid and electrolyte balance Plan -Continue to monitor BMP -Replace electrolytes PRN   Anemia -without evidence of acute blood loss  -etiology possibly anemia of chronic disease  Plan -Continue to monitor H/H on CBC -Monitor for s/sx bleeding -SCDs for DVT ppx   Hyperglycemia Plan -SSI  Family Discussion: -3/17: patient's children inquired about the duration  of the "breathing machine," asking how long their father would require mechanical ventilation. I explained that it is difficult to predict how long. We discussed multiple factors which could contribute to prolonged ventilator support, including COPD, PTX, and infection. We discussed the infectious risks associated with mechanical ventilation and the possibility of deconditioning. The family exhibited understanding in the complexity of the patient's pulmonary  picture, stating "He has a lot of serious lung problems, that's part of why he didn't want a breathing tube before. We all only thought it would be for a day or two just to get stronger."  I offered emotional comfort stating that it sounds like this has been a complicated hospital course and offered emotional assurance.  [name] daughter asked if the patient will require mechanical ventilation after surgery, adding that she knows "nothing is ever set in stone and I know you can't know, but we just want to understand what all could happen." She added, "And what happens with the breathing tube if he never gets strong enough for surgery?" I assured her that these were appropriate questions and agreed that I cannot know for sure as all patients are different and patient needs can dynamically change.  We discussed the possibility of successful extubation following surgery. The family stated they "worry it might be hard for that to happen because of all his other lung problems and what his heart did before [going into SVT during PSV/CPAP trial]." We discussed that in some situations, patients who are not able to wean on a ventilator can be considered for a tracheostomy. [name] daughter quickly stated he would not ever want that. We then discussed the possibility that the patient may not have resolved respiratory failure and if we are unable to wean from the ventilator and the patient does not want to stay intubated, we might discuss removing the ETT, understanding that he may exhibit respiratory failure, and honor the patient's wishes not to replace the breathing tube. We discussed that all situations are different and that sometimes in situations like that, we can support patient's with oxygen but sometimes patient's wishes are to focus on being comfortable, even if that means being comfortable. The family thanked me for explaining, stating they "just didn't know who to ask about what might happen with  the breathing machine. We want to make sure we are doing the right things for what he wants."  I thanked them for allowing me to take part in this patient's care and offered emotional support.    We did NOT discuss extubating at this time. We did NOT discuss transitioning to comfort care at this time.      Best practice:  Diet: enteral nutrition Pain/Anxiety/Delirium protocol (if indicated): fentanyl gtt, PRN fentanyl  PRN versed  VAP protocol (if indicated): Continue  DVT prophylaxis: SCD GI prophylaxis: famotidine  Glucose control: SSI  Mobility: As tolerated  Code Status: Full Family Communication: discussed with daughters, son at length 3/17 Disposition: Continue ICU level of care   Critical Care Time: 45 minutes   Tessie Fass MSN, AGACNP-BC [place] [facility] [phone] If no answer, [phone] 12/22/2018, 1:42 PM

## 2018-12-22 NOTE — Progress Notes (Signed)
Progress Note  Patient Name: Matthew Cuevas Date of Encounter: 12/22/2018  Primary Cardiologist: No primary care provider on file.   Subjective   Intubated, following commands, denies pain  Inpatient Medications    Scheduled Meds: . arformoterol  15 mcg Nebulization BID  . aspirin  81 mg Per Tube Daily  . budesonide (PULMICORT) nebulizer solution  0.5 mg Nebulization BID  . chlorhexidine gluconate (MEDLINE KIT)  15 mL Mouth Rinse BID  . Chlorhexidine Gluconate Cloth  6 each Topical Daily  . enoxaparin (LOVENOX) injection  40 mg Subcutaneous Daily  . famotidine  20 mg Per Tube BID  . feeding supplement (PRO-STAT SUGAR FREE 64)  30 mL Per Tube BID  . guaiFENesin  5 mL Per Tube Q4H  . insulin aspart  2-6 Units Subcutaneous Q4H  . levalbuterol  0.63 mg Nebulization Q6H  . mouth rinse  15 mL Mouth Rinse 10 times per day  . metoprolol tartrate  12.5 mg Per Tube BID  . montelukast  10 mg Per Tube QHS   Continuous Infusions: . sodium chloride    .  ceFAZolin (ANCEF) IV Stopped (12/22/18 0640)  . feeding supplement (VITAL AF 1.2 CAL) 1,000 mL (12/21/18 1418)  . fentaNYL infusion INTRAVENOUS 175 mcg/hr (12/22/18 0700)  . phenylephrine (NEO-SYNEPHRINE) Adult infusion 25 mcg/min (12/22/18 0800)   PRN Meds: Place/Maintain arterial line **AND** sodium chloride, fentaNYL (SUBLIMAZE) injection, levalbuterol, LORazepam, metoprolol tartrate, ondansetron **OR** [DISCONTINUED] ondansetron (ZOFRAN) IV   Vital Signs    Vitals:   12/22/18 0801 12/22/18 0804 12/22/18 0835 12/22/18 0900  BP:  133/81  (!) 99/55  Pulse:  94  82  Resp:  14  18  Temp:   98.3 F (36.8 C)   TempSrc:   Oral   SpO2: 94% 95%  94%  Weight:      Height:        Intake/Output Summary (Last 24 hours) at 12/22/2018 1022 Last data filed at 12/22/2018 0900 Gross per 24 hour  Intake 2995.51 ml  Output 1310 ml  Net 1685.51 ml   Last 3 Weights 12/22/2018 12/21/2018 12/20/2018  Weight (lbs) 171 lb 11.8 oz 170 lb  13.7 oz 168 lb 14 oz  Weight (kg) 77.9 kg 77.5 kg 76.6 kg      Telemetry    Normal sinus rhythm with periods of sinus tachycardia and 1 brief episode of SVT with a heart rate approximately 150 bpm - Personally Reviewed  Physical Exam  Awake, alert, ET tube in place GEN: No acute distress.   Neck: No JVD Cardiac: RRR, no murmurs, rubs, or gallops.  Respiratory: diffuse rhonchi bilaterally. GI: Soft, nontender, non-distended  MS: Mild edema; No deformity. Neuro:  Nonfocal  Psych: Normal affect   Labs    Chemistry Recent Labs  Lab 12/20/18 0538 12/21/18 0609 12/22/18 0302  NA 145 146* 146*  K 3.6 4.0 4.1  CL 115* 118* 117*  CO2 22 21* 23  GLUCOSE 131* 110* 111*  BUN 36* 43* 45*  CREATININE 1.10 1.07 0.98  CALCIUM 8.4* 8.2* 8.2*  PROT 5.6*  --   --   ALBUMIN 2.6*  --   --   AST 18  --   --   ALT 22  --   --   ALKPHOS 80  --   --   BILITOT 0.7  --   --   GFRNONAA >60 >60 >60  GFRAA >60 >60 >60  ANIONGAP _0 Hematology  Recent Labs  Lab 12/20/18 0538 12/21/18 0609 12/22/18 0302  WBC 15.3* 16.3* 13.8*  RBC 3.22* 3.01* 2.85*  HGB 9.9* 9.2* 9.0*  HCT 32.8* 30.5* 29.1*  MCV 101.9* 101.3* 102.1*  MCH 30.7 30.6 31.6  MCHC 30.2 30.2 30.9  RDW 12.9 13.2 13.6  PLT 400 416* 381    Cardiac EnzymesNo results for input(s): TROPONINI in the last 168 hours. No results for input(s): TROPIPOC in the last 168 hours.   BNPNo results for input(s): BNP, PROBNP in the last 168 hours.   DDimer No results for input(s): DDIMER in the last 168 hours.   Radiology    Dg Chest Port 1 View  Result Date: 12/22/2018 CLINICAL DATA:  Chest tube EXAM: PORTABLE CHEST 1 VIEW COMPARISON:  Yesterday FINDINGS: Left apical chest tube. Unchanged small left apical pneumothorax. Decreasing left chest wall emphysema. History of pneumonia with left more than right airspace disease. Emphysema. Endobronchial valves on the left. Endotracheal and orogastric tubes remain in good position.  IMPRESSION: 1. Unchanged small left apical pneumothorax. Decreasing chest wall emphysema. 2. Pneumonia with unchanged lung opacity. Electronically Signed   By: Monte Fantasia M.D.   On: 12/22/2018 08:56   Dg Chest Port 1 View  Result Date: 12/21/2018 CLINICAL DATA:  Spontaneous pneumothorax EXAM: PORTABLE CHEST 1 VIEW COMPARISON:  Yesterday FINDINGS: Endotracheal tube tip between the clavicular heads and carina. The orogastric tube at least reaches the stomach. Continued shortening of right IJ line with tip near the IJ subclavian confluence. Left-sided chest tube in stable position. Increasing chest wall emphysema. Endobronchial valves on the left. Small left apical pneumothorax. Interstitial and airspace opacity throughout the left lung and at the right base. IMPRESSION: 1. Shorter right IJ catheter with tip near the IJ subclavian confluence. 2. Increased left chest wall emphysema. Unchanged small left apical pneumothorax. 3. History of pneumonia with stable airspace disease. Electronically Signed   By: Monte Fantasia M.D.   On: 12/21/2018 07:17   Dg Chest Port 1 View  Result Date: 12/20/2018 CLINICAL DATA:  Chest tube removal EXAM: PORTABLE CHEST 1 VIEW COMPARISON:  Chest radiograph 12/20/2018 FINDINGS: ET tube terminates in the mid trachea. Enteric tube courses inferior to the diaphragm. Right IJ sheath projects over the right lung apex. Left chest tube similar position. Interval removal chest tube in the left lung base. Similar-appearing left-greater-than-right mid lower lung patchy consolidative opacities. Small left pleural effusion. No definite pneumothorax. IMPRESSION: Interval removal 1 of the 2 left chest tubes. No definite left-sided pneumothorax. ET tube mid trachea. Electronically Signed   By: Lovey Newcomer M.D.   On: 12/20/2018 11:29     Patient Profile     78 y.o. male with paroxysmal SVT in the setting of left pneumothorax and respiratory failure  Assessment & Plan    Paroxysmal  SVT: The patient continues on metoprolol as tolerated at low dose.  He still requires Neo-Synephrine for blood pressure support.  Again, can use amiodarone if he begins to have more frequent arrhythmia, but for now would hold off as he is having only short runs and maintaining sinus rhythm for the most part.  We will continue to follow.      For questions or updates, please contact [facility] Please consult [email or web address] for contact info under        Signed, Sherren Mocha, MD  12/22/2018, 10:22 AM

## 2018-12-23 ENCOUNTER — Inpatient Hospital Stay (HOSPITAL_COMMUNITY): Payer: Medicare Other

## 2018-12-23 DIAGNOSIS — J9311 Primary spontaneous pneumothorax: Secondary | ICD-10-CM

## 2018-12-23 DIAGNOSIS — Z4682 Encounter for fitting and adjustment of non-vascular catheter: Secondary | ICD-10-CM

## 2018-12-23 LAB — GLUCOSE, CAPILLARY
GLUCOSE-CAPILLARY: 102 mg/dL — AB (ref 70–99)
GLUCOSE-CAPILLARY: 114 mg/dL — AB (ref 70–99)
Glucose-Capillary: 103 mg/dL — ABNORMAL HIGH (ref 70–99)
Glucose-Capillary: 108 mg/dL — ABNORMAL HIGH (ref 70–99)
Glucose-Capillary: 116 mg/dL — ABNORMAL HIGH (ref 70–99)
Glucose-Capillary: 122 mg/dL — ABNORMAL HIGH (ref 70–99)
Glucose-Capillary: 92 mg/dL (ref 70–99)

## 2018-12-23 LAB — CBC
HCT: 32.9 % — ABNORMAL LOW (ref 39.0–52.0)
Hemoglobin: 9.6 g/dL — ABNORMAL LOW (ref 13.0–17.0)
MCH: 30.4 pg (ref 26.0–34.0)
MCHC: 29.2 g/dL — ABNORMAL LOW (ref 30.0–36.0)
MCV: 104.1 fL — ABNORMAL HIGH (ref 80.0–100.0)
Platelets: 340 10*3/uL (ref 150–400)
RBC: 3.16 MIL/uL — ABNORMAL LOW (ref 4.22–5.81)
RDW: 13.8 % (ref 11.5–15.5)
WBC: 14.7 10*3/uL — AB (ref 4.0–10.5)
nRBC: 0 % (ref 0.0–0.2)

## 2018-12-23 LAB — BASIC METABOLIC PANEL
Anion gap: 6 (ref 5–15)
BUN: 38 mg/dL — ABNORMAL HIGH (ref 8–23)
CO2: 26 mmol/L (ref 22–32)
Calcium: 8.5 mg/dL — ABNORMAL LOW (ref 8.9–10.3)
Chloride: 117 mmol/L — ABNORMAL HIGH (ref 98–111)
Creatinine, Ser: 1 mg/dL (ref 0.61–1.24)
GFR calc Af Amer: >60 mL/min (ref >60)
GFR calc non Af Amer: >60 mL/min (ref >60)
Glucose, Bld: 98 mg/dL (ref 70–99)
POTASSIUM: 3.9 mmol/L (ref 3.5–5.1)
SODIUM: 149 mmol/L — AB (ref 135–145)

## 2018-12-23 MED ORDER — SENNOSIDES 8.8 MG/5ML PO SYRP
5.0000 mL | ORAL_SOLUTION | Freq: Two times a day (BID) | ORAL | Status: DC
  Administered 2018-12-23 – 2018-12-28 (×11): 5 mL
  Filled 2018-12-23 (×11): qty 5

## 2018-12-23 NOTE — Procedures (Signed)
Central Venous Catheter Insertion Procedure Note Matthew Cuevas [id number] 03-Oct-1941  Procedure: Insertion of Central Venous Catheter Indications: Drug and/or fluid administration and Frequent blood sampling  Procedure Details Consent: Risks of procedure as well as the alternatives and risks of each were explained to the (patient/caregiver).  Consent for procedure obtained. Time Out: Verified patient identification, verified procedure, site/side was marked, verified correct patient position, special equipment/implants available, medications/allergies/relevent history reviewed, required imaging and test results available.  Performed  The patient briefly desaturated upon transition to the supine position. Fio2 increased and O2 sats responded accordingly.   Maximum sterile technique was used including antiseptics, cap, gloves, gown, hand hygiene, mask and sheet. Skin prep: Chlorhexidine; local anesthetic administered A antimicrobial bonded/coated triple lumen catheter was placed in the left internal jugular vein using the Seldinger technique. Catheter placed to 20 cm. Blood aspirated via all 3 ports and then flushed x 3. Line sutured x 2 and dressing applied.  Ultrasound guidance used.Yes.     FiO2 decreased back to 40%.   Evaluation Blood flow good Complications: No apparent complications Patient did tolerate procedure well. Chest X-ray ordered to verify placement.  CXR: pending.   [name], AGACNP-BC [facility] Pulmonary/Critical Care Pager [phone] or [phone]  12/23/2018 12:01 PM

## 2018-12-23 NOTE — Progress Notes (Signed)
NAME:  Matthew Cuevas, MRN:  [id number], DOB:  1941/08/28, LOS: 14 ADMISSION DATE:  12/15/2018, CONSULTATION DATE:  12/18/2018 REFERRING MD:  Jonathon Bellows, CHIEF COMPLAINT:  Shortness of breath  Brief History   78 yo WM COPD with a BPF s/p bleb rupture 2/23. Failed chemical pleurodesis and EBV.  Past Medical History  He,  has a past medical history of COPD (chronic obstructive pulmonary disease) (HCC) and HTN (hypertension).   Significant Hospital Events   PEA arrest at induction today 3/13. Re-intubated.  3/14: developed SVT during weaning attempt so aborted. Started on vanc for staph in sputum.   Consults:    Procedures:  VATS cancelled  Significant Diagnostic Tests:  CT  Micro Data:  Sputum culture 3/13: MSSA Blood culture 3/6: This was negative Urine culture 3/6: Enterococcus faecalis 30,000 colony count  Antimicrobials:  Cefepime 3/13>>3/16 Ancef 3/16>>> Vancomycin 3/14>>>3/16  Interim history/subjective:  Agitated this morning requiring increased sedation.   Objective   Blood pressure (!) 104/51, pulse 74, temperature 98.3 F (36.8 C), temperature source Oral, resp. rate 16, height 5\' 10"  (1.778 m), weight 77.9 kg, SpO2 99 %.    Vent Mode: PRVC FiO2 (%):  [40 %] 40 % Set Rate:  [16 bmp] 16 bmp Vt Set:  [580 mL] 580 mL PEEP:  [5 cmH20] 5 cmH20 Plateau Pressure:  [17 cmH20-25 cmH20] 18 cmH20   Intake/Output Summary (Last 24 hours) at 12/23/2018 1616 Last data filed at 12/23/2018 1400 Gross per 24 hour  Intake 3264.81 ml  Output 1590 ml  Net 1674.81 ml   Filed Weights   12/21/18 0500 12/22/18 0530 12/23/18 0200  Weight: 77.5 kg 77.9 kg 77.9 kg   Physical Exam: General: Chronically appearing, sedated HENT: [place], AT, OP clear, MMM Eyes: EOMI, no scleral icterus Respiratory: Decreased breath sounds bilaterally.  No crackles, wheezing or rales. Left chest tube in place with persistent air leak Cardiovascular: RRR, -M/R/G, no JVD GI: BS+, soft, nontender  Extremities:-Edema,-tenderness Neuro: Sedated, unable to follow commands Skin: Intact, no rashes or bruising Psych: Normal mood, normal affect  Resolved Hospital Problem list   PEA arrest (respiratory and anesthesia related 3/13)  Assessment & Plan:   Agitation requiring sedation Plan -Wean fentanyl gtt to RASS -1  Acute Hypoxic respiratory failure 2/2 AECOPD, MSSA PNA vs ATX and persistent left PTX w/ BPF.  Difficult to wean due to hemodynamic instability (tachypnea, episodes SVT) during weaning CXR 3/18 unchanged left infiltrate, unchanged small left apical pneumothorax, L IJ CVC Plan -Full vent support -SBT as tolerated when mental status improves -Adjust PEEP/FiO2 for SpO2  -Continue Ancef for MSSA PNA for 7 days -Continue brovana and budesonide nebulizer -VAP bundle  -CTS planning for VATS with pleurodesis -Chest tube to suction  Hypotension Likely exacerbated by sedating medications Plan -Continue Neo, titrate for MAP > 60 -Wean sedation as tolerated -CVC placed today  SVT SVT runs occurring with vent weaning Plan -Telemetry -Cardiology following, appreciate recs. Recommend BB. Consider amio if more frequent. -Holding metoprolol in setting of hypotension -BMP, Mg daily  Hyperglycemia Plan -SSI  GOC CTS and CCM will arrange family meeting to further discuss surgical risk.   Best practice:  Diet: enteral nutrition Pain/Anxiety/Delirium protocol (if indicated): fentanyl gtt, PRN fentanyl  PRN versed  VAP protocol (if indicated): Continue  DVT prophylaxis: SCD GI prophylaxis: famotidine  Glucose control: SSI  Mobility: As tolerated  Code Status: Full Family Communication: Discussed care plan with wife. She requests family meeting with both CTS and Critical  Care team to discuss overall  Disposition: Continue ICU level of care   Critical Care: 50 min  The patient is critically ill with multiple organ systems failure and requires high complexity  decision making for assessment and support, frequent evaluation and titration of therapies, application of advanced monitoring technologies and extensive interpretation of multiple databases.   Mechele Collin, M.D. [facility] Pulmonary/Critical Care Medicine Pager: [phone] After hours pager: [phone]

## 2018-12-23 NOTE — Progress Notes (Signed)
[facility] Note: [name] telemetry is reviewed and shows normal sinus rhythm with no further episodes of SVT.  He otherwise remained stable and is weaning from the ventilator.  He continues to require Neo-Synephrine for blood pressure support.  It appears he has been getting metoprolol as scheduled.  I am going to hold his metoprolol to help with his blood pressure and weaning from Neo-Synephrine.  Otherwise no changes recommended.  Management per TCTS and CCM teams.  We will continue to follow his heart rhythm as issues arise.  Matthew Cuevas 12/23/2018 9:19 AM

## 2018-12-23 NOTE — Progress Notes (Signed)
6 Days Post-Op Procedure(s): Cancelled Procedure Subjective: Currently sedated after central line placement  Objective: Vital signs in last 24 hours: Temp:  [97.6 F (36.4 C)-99.3 F (37.4 C)] 98.3 F (36.8 C) (03/18 1200) Pulse Rate:  [52-92] 63 (03/18 1300) Cardiac Rhythm: Normal sinus rhythm (03/18 0800) Resp:  [13-20] 16 (03/18 1300) BP: (59-124)/(39-84) 91/56 (03/18 1300) SpO2:  [94 %-100 %] 98 % (03/18 1300) FiO2 (%):  [40 %] 40 % (03/18 1106) Weight:  [77.9 kg] 77.9 kg (03/18 0200)  Hemodynamic parameters for last 24 hours:    Intake/Output from previous day: 03/17 0701 - 03/18 0700 In: 3023.4 [I.V.:1819.3; NG/GT:1015; IV Piggyback:189.1] Out: 1630 [Urine:1185; Emesis/NG output:125; Chest Tube:320] Intake/Output this shift: Total I/O In: 878.2 [I.V.:438.2; NG/GT:440] Out: 610 [Urine:350; Chest Tube:260]  General appearance: sedated Neurologic: sedated Heart: regular rate and rhythm Lungs: wheezes bilaterally + air leak  Lab Results: Recent Labs    12/22/18 0302 12/23/18 0247  WBC 13.8* 14.7*  HGB 9.0* 9.6*  HCT 29.1* 32.9*  PLT 381 340   BMET:  Recent Labs    12/22/18 0302 12/23/18 0247  NA 146* 149*  K 4.1 3.9  CL 117* 117*  CO2 23 26  GLUCOSE 111* 98  BUN 45* 38*  CREATININE 0.98 1.00  CALCIUM 8.2* 8.5*    PT/INR: No results for input(s): LABPROT, INR in the last 72 hours. ABG    Component Value Date/Time   PHART 7.378 12/20/2018 0316   HCO3 21.7 12/20/2018 0316   TCO2 23 12/20/2018 0316   ACIDBASEDEF 3.0 (H) 12/20/2018 0316   O2SAT 98.0 12/20/2018 0316   CBG (last 3)  Recent Labs    12/23/18 0015 12/23/18 0332 12/23/18 1202  GLUCAP 103* 102* 122*    Assessment/Plan: S/P Procedure(s): Cancelled Procedure -Remains intubated Still has an air leak- it is smaller but still significant On ancef for staph aureus HCAP   LOS: 14 days    [name] 12/23/2018

## 2018-12-23 NOTE — Progress Notes (Signed)
Patient's wife, Kriste Basque, requested interdisciplinary meeting with all physicians involved in patient's care. Dr. Everardo All, CCM notified of Becky's request. Wende Mott, Mitzi Hansen

## 2018-12-24 ENCOUNTER — Inpatient Hospital Stay (HOSPITAL_COMMUNITY): Payer: Medicare Other

## 2018-12-24 DIAGNOSIS — J449 Chronic obstructive pulmonary disease, unspecified: Secondary | ICD-10-CM

## 2018-12-24 DIAGNOSIS — I4891 Unspecified atrial fibrillation: Secondary | ICD-10-CM

## 2018-12-24 LAB — URINALYSIS, ROUTINE W REFLEX MICROSCOPIC
Bilirubin Urine: NEGATIVE
Glucose, UA: NEGATIVE mg/dL
Hgb urine dipstick: NEGATIVE
Ketones, ur: NEGATIVE mg/dL
Nitrite: NEGATIVE
Protein, ur: NEGATIVE mg/dL
SPECIFIC GRAVITY, URINE: 1.016 (ref 1.005–1.030)
pH: 5 (ref 5.0–8.0)

## 2018-12-24 LAB — GLUCOSE, CAPILLARY
GLUCOSE-CAPILLARY: 106 mg/dL — AB (ref 70–99)
GLUCOSE-CAPILLARY: 108 mg/dL — AB (ref 70–99)
Glucose-Capillary: 111 mg/dL — ABNORMAL HIGH (ref 70–99)
Glucose-Capillary: 106 mg/dL — ABNORMAL HIGH (ref 70–99)
Glucose-Capillary: 96 mg/dL (ref 70–99)
Glucose-Capillary: 155 mg/dL — ABNORMAL HIGH (ref 70–99)

## 2018-12-24 MED ORDER — SODIUM CHLORIDE 0.9 % IV SOLN
INTRAVENOUS | Status: DC | PRN
  Administered 2018-12-24: 1000 mL via INTRAVENOUS

## 2018-12-24 MED ORDER — QUETIAPINE FUMARATE 25 MG PO TABS
25.0000 mg | ORAL_TABLET | Freq: Every day | ORAL | Status: DC
  Administered 2018-12-24 – 2018-12-28 (×5): 25 mg via ORAL
  Filled 2018-12-24 (×5): qty 1

## 2018-12-24 MED ORDER — SODIUM CHLORIDE 0.9% FLUSH
10.0000 mL | Freq: Two times a day (BID) | INTRAVENOUS | Status: DC
  Administered 2018-12-24 (×2): 10 mL
  Administered 2018-12-24: 30 mL
  Administered 2018-12-25: 10 mL
  Administered 2018-12-26: 20 mL
  Administered 2018-12-27 – 2018-12-28 (×3): 10 mL

## 2018-12-24 MED ORDER — POLYETHYLENE GLYCOL 3350 17 G PO PACK
17.0000 g | PACK | Freq: Every day | ORAL | Status: DC
  Administered 2018-12-24 – 2018-12-28 (×4): 17 g via ORAL
  Filled 2018-12-24 (×4): qty 1

## 2018-12-24 MED ORDER — SODIUM CHLORIDE 0.9% FLUSH: 10.0000 mL | INTRAVENOUS | Status: DC | PRN

## 2018-12-24 NOTE — H&P (View-Only) (Signed)
7 Days Post-Op Procedure(s): Cancelled Procedure Subjective: Intubated, sedated Did pretty well on vent for about 6 hours before becoming fatigued  Objective: Vital signs in last 24 hours: Temp:  [97.6 F (36.4 C)-100.5 F (38.1 C)] 97.8 F (36.6 C) (03/19 1240) Pulse Rate:  [71-131] 102 (03/19 1415) Cardiac Rhythm: Atrial flutter (03/19 0400) Resp:  [14-31] 19 (03/19 1415) BP: (78-141)/(39-107) 100/54 (03/19 1415) SpO2:  [93 %-100 %] 100 % (03/19 1415) FiO2 (%):  [40 %] 40 % (03/19 1328) Weight:  [80.8 kg] 80.8 kg (03/19 0500)  Hemodynamic parameters for last 24 hours:    Intake/Output from previous day: 03/18 0701 - 03/19 0700 In: 3269.1 [I.V.:1848.5; NG/GT:1340; IV Piggyback:80.6] Out: 1895 [Urine:1495; Chest Tube:400] Intake/Output this shift: Total I/O In: 1091.9 [I.V.:657.8; NG/GT:350; IV Piggyback:84] Out: 515 [Urine:405; Chest Tube:110]  General appearance: intubated Neurologic: sedated, is moving all 4 Heart: irregularly irregular rhythm Lungs: rhonchi bilaterally + air leak unchanged  Lab Results: Recent Labs    12/22/18 0302 12/23/18 0247  WBC 13.8* 14.7*  HGB 9.0* 9.6*  HCT 29.1* 32.9*  PLT 381 340   BMET:  Recent Labs    12/22/18 0302 12/23/18 0247  NA 146* 149*  K 4.1 3.9  CL 117* 117*  CO2 23 26  GLUCOSE 111* 98  BUN 45* 38*  CREATININE 0.98 1.00  CALCIUM 8.2* 8.5*    PT/INR: No results for input(s): LABPROT, INR in the last 72 hours. ABG    Component Value Date/Time   PHART 7.378 12/20/2018 0316   HCO3 21.7 12/20/2018 0316   TCO2 23 12/20/2018 0316   ACIDBASEDEF 3.0 (H) 12/20/2018 0316   O2SAT 98.0 12/20/2018 0316   CBG (last 3)  Recent Labs    12/24/18 0413 12/24/18 0759 12/24/18 1242  GLUCAP 111* 106* 96    Assessment/Plan: S/P Procedure(s): Cancelled Procedure -Persistent air leak- unchanged recently although it is smaller than it was initially I discussed the options with Mrs Ellwood and his son and daughter. We  discussed possible left VATS for bleb resection. They are aware of the indications, risks, benefits and alternatives (talc pleurodesis, withdrawal of care). I made it clear that I do not think the air leak is the reason he has been unable to wean from ventilator and that he will still be on it postop. Likely will need temporary trach to successfully wean from vent. They understand the procedure is high risk in current setting and there is no guarantee of success.  They wish to proceed  Steven C. Hendrickson, MD [facility] [phone]    LOS: 15 days    Steven C Hendrickson 12/24/2018  

## 2018-12-24 NOTE — Progress Notes (Signed)
Patient had been tolerating ventilator wean since this AM. Around 1300 patient was noted to be tachycardic and having shallow labored breaths with increased shoulder movements. CCM and Dr. Dorris Fetch at bedside and switched from pressure support to [facility] on the vent and suctioned patient. RT notified. Patient remains tachycardic with labored breathing, fentanyl drip increased to maintain comfort. Will continue to monitor.  Leanna Battles, RN

## 2018-12-24 NOTE — Progress Notes (Signed)
7 Days Post-Op Procedure(s): Cancelled Procedure Subjective: Intubated, sedated Did pretty well on vent for about 6 hours before becoming fatigued  Objective: Vital signs in last 24 hours: Temp:  [97.6 F (36.4 C)-100.5 F (38.1 C)] 97.8 F (36.6 C) (03/19 1240) Pulse Rate:  [71-131] 102 (03/19 1415) Cardiac Rhythm: Atrial flutter (03/19 0400) Resp:  [14-31] 19 (03/19 1415) BP: (78-141)/(39-107) 100/54 (03/19 1415) SpO2:  [93 %-100 %] 100 % (03/19 1415) FiO2 (%):  [40 %] 40 % (03/19 1328) Weight:  [80.8 kg] 80.8 kg (03/19 0500)  Hemodynamic parameters for last 24 hours:    Intake/Output from previous day: 03/18 0701 - 03/19 0700 In: 3269.1 [I.V.:1848.5; NG/GT:1340; IV Piggyback:80.6] Out: 0947 [SJGGE:3662; Chest Tube:400] Intake/Output this shift: Total I/O In: 1091.9 [I.V.:657.8; NG/GT:350; IV Piggyback:84] Out: 515 [Urine:405; Chest Tube:110]  General appearance: intubated Neurologic: sedated, is moving all 4 Heart: irregularly irregular rhythm Lungs: rhonchi bilaterally + air leak unchanged  Lab Results: Recent Labs    12/22/18 0302 12/23/18 0247  WBC 13.8* 14.7*  HGB 9.0* 9.6*  HCT 29.1* 32.9*  PLT 381 340   BMET:  Recent Labs    12/22/18 0302 12/23/18 0247  NA 146* 149*  K 4.1 3.9  CL 117* 117*  CO2 23 26  GLUCOSE 111* 98  BUN 45* 38*  CREATININE 0.98 1.00  CALCIUM 8.2* 8.5*    PT/INR: No results for input(s): LABPROT, INR in the last 72 hours. ABG    Component Value Date/Time   PHART 7.378 12/20/2018 0316   HCO3 21.7 12/20/2018 0316   TCO2 23 12/20/2018 0316   ACIDBASEDEF 3.0 (H) 12/20/2018 0316   O2SAT 98.0 12/20/2018 0316   CBG (last 3)  Recent Labs    12/24/18 0413 12/24/18 0759 12/24/18 1242  GLUCAP 111* 106* 96    Assessment/Plan: S/P Procedure(s): Cancelled Procedure -Persistent air leak- unchanged recently although it is smaller than it was initially I discussed the options with Mrs Deadmon and his son and daughter. We  discussed possible left VATS for bleb resection. They are aware of the indications, risks, benefits and alternatives (talc pleurodesis, withdrawal of care). I made it clear that I do not think the air leak is the reason he has been unable to wean from ventilator and that he will still be on it postop. Likely will need temporary trach to successfully wean from vent. They understand the procedure is high risk in current setting and there is no guarantee of success.  They wish to proceed  Salvatore Decent. Dorris Fetch, MD [facility] [phone]    LOS: 15 days    [name] 12/24/2018

## 2018-12-24 NOTE — Progress Notes (Addendum)
NAME:  Matthew Cuevas, MRN:  [id number], DOB:  1941/07/29, LOS: 15 ADMISSION DATE:  12/19/2018, CONSULTATION DATE:  12/18/2018 REFERRING MD:  Jonathon Bellows, CHIEF COMPLAINT:  Shortness of breath  Brief History   78 yo WM COPD with a BPF s/p bleb rupture 2/23. Failed chemical pleurodesis and EBV.  Past Medical History  He,  has a past medical history of COPD (chronic obstructive pulmonary disease) (HCC) and HTN (hypertension).   Significant Hospital Events   PEA arrest at induction today 3/13. Re-intubated.  3/14: developed SVT during weaning attempt so aborted. Started on vanc for staph in sputum.   Consults:  CVTS  Procedures:  VATS cancelled  Significant Diagnostic Tests:  CT 3/6 > There is a large anterior left upper pneumothorax measuring up to 7.6 cm in thickness and repositioning of the patient's left-sided chest tube is recommended more so anteriorly. Partial left upper lobe collapse and atelectasis is noted. 2. Extensive subcutaneous soft tissue emphysema along the left lateral chest wall, about base of neck and both shoulders extending into the superior mediastinum.  Micro Data:  Sputum culture 3/13: MSSA Blood culture 3/6: This was negative Urine culture 3/6: Enterococcus faecalis 30,000 colony count  Antimicrobials:  Cefepime 3/13>>3/16 Ancef 3/16>>> Vancomycin 3/14>>>3/16  Interim history/subjective:  Agitated this morning requiring increased sedation.   Objective   Blood pressure (!) 108/49, pulse 86, temperature 98.6 F (37 C), temperature source Oral, resp. rate 17, height 5\' 10"  (1.778 m), weight 80.8 kg, SpO2 97 %.    Vent Mode: PSV;CPAP FiO2 (%):  [40 %] 40 % Set Rate:  [16 bmp] 16 bmp Vt Set:  [580 mL] 580 mL PEEP:  [5 cmH20] 5 cmH20 Pressure Support:  [12 cmH20] 12 cmH20 Plateau Pressure:  [18 cmH20-26 cmH20] 18 cmH20   Intake/Output Summary (Last 24 hours) at 12/24/2018 1103 Last data filed at 12/24/2018 1000 Gross per 24 hour  Intake 3031.47 ml  Output  1735 ml  Net 1296.47 ml   Filed Weights   12/22/18 0530 12/23/18 0200 12/24/18 0500  Weight: 77.9 kg 77.9 kg 80.8 kg   Physical Exam: General:  Elderly male on vent in NAD Neuro:  Sedated. Agitated at times.  HEENT:  [place]/AT, No JVD noted, PERRL Cardiovascular:  IRIR, rate controlled, no MRG Lungs:  Diminished throughout. 1/7 air leak L chest tube.  Abdomen:  Soft, non-distended Musculoskeletal:  No acute deformity Skin:  Intact, MMM  Resolved Hospital Problem list   PEA arrest (respiratory and anesthesia related 3/13)  Assessment & Plan:   Agitation requiring sedation Plan -Wean fentanyl gtt to RASS -1  Acute Hypoxic respiratory failure 2/2 AECOPD, MSSA PNA vs ATX and persistent left PTX w/ BPF.  Difficult to wean due to hemodynamic instability (tachypnea, episodes SVT) during weaning CXR 3/18 unchanged left infiltrate, unchanged small left apical pneumothorax, L IJ CVC Plan -Full vent support -SBT as tolerated when mental status improves (tol 5/5 well today) -Continue Ancef for MSSA PNA for 7 days (stop 3/20) -Continue brovana and budesonide nebulizer -VAP bundle  -CTS planning for VATS with pleurodesis, will await CVTS plans prior to pursuing extubation, for which he is likely ready.  -Chest tube to suction. 1/7 air leak.   Hypotension: increased pressor demand this morning.  Likely exacerbated by sedating medications Plan -Continue Neo, titrate for MAP > 60 -Wean sedation as tolerated -Check cultures.   SVT Now AFib/ flutter SVT runs occurring with vent weaning Plan -Telemetry -Cardiology following, appreciate recs. Recommend BB. Consider amio  if more frequent. -Holding metoprolol in setting of hypotension -Not currently a candidate for [facility] -Cardiology following.  -BMP, Mg daily  Hyperglycemia Plan -SSI  GOC - Awaiting surgical plan.   Constipation - Senna - MiraLax started 3/19   Best practice:  Diet: enteral nutrition Pain/Anxiety/Delirium  protocol (if indicated): fentanyl gtt, PRN fentanyl  PRN versed  VAP protocol (if indicated): Continue  DVT prophylaxis: SCD GI prophylaxis: famotidine  Glucose control: SSI  Mobility: As tolerated  Code Status: Full Family Communication: Discussed care with wife 3/19 Disposition: Continue ICU level of care   Critical Care: 30 mins   [name], AGACNP-BC [facility] Pulmonary/Critical Care Pager [phone] or [phone]  12/24/2018 11:23 AM

## 2018-12-24 NOTE — Progress Notes (Signed)
Nutrition Follow-up  DOCUMENTATION CODES:   Non-severe (moderate) malnutrition in context of chronic illness  INTERVENTION:  Continue tube feeding via OG tube: - Vital AF 1.2 @ 50 ml/hr (1200 ml/day) - Pro-stat 30 ml BID - Free water per MD  Tube feeding regimen provides 1640 kcal, 120 grams of protein, and 973 ml of H2O (100% of needs).  NUTRITION DIAGNOSIS:   Moderate Malnutrition related to chronic illness (COPD) as evidenced by moderate fat depletion, moderate muscle depletion.  Ongoing, being addressed via TF  GOAL:   Patient will meet greater than or equal to 90% of their needs  Met via TF  MONITOR:   Vent status, Labs, I & O's, Weight trends, TF tolerance, Skin  REASON FOR ASSESSMENT:   Consult Enteral/tube feeding initiation and management  ASSESSMENT:   78 yo Male with a history significant for bullous emphysema, COPD who had a bleb rupture and has been managed for a bronchopleural fistula since then came to the ICU s/p cardiac arrest.  3/11 - persistent air leak, CVTSfollowing 3/12- s/pVATS,PEAarrestafter anesthesia,extubated 3/13 - placed onBiPAP overnight 3/13 - intubated 3/15 - TF initiated, Adult Enteral Nutrition Protocol ordered  Current TF: Vital AF 1.2 @ 50 ml/hr, Pro-stat 30 ml BID  Discussed pt with RN and during ICU rounds. Sill no BM since 3/11. Noted Miralax now added to bowel regimen. Abdomen firm on RD exam.  Per critical care note, pt likely ready for extubation but will await CVTS input.  Will continue to use EDW of 73.4 kg when estimating needs given positive fluid balance. Pt with deep pitting edema to BUE, BLE.  No changes to TF regimen at this time. Will monitor for extubation, diet advancement and supplement as appropriate.  Patient is currently intubated on ventilator support MV: 7.3 L/min Temp (24hrs), Avg:99 F (37.2 C), Min:97.6 F (36.4 C), Max:100.5 F (38.1 C) BP: 109/47 MAP: 66  Propofol: none Fentanyl:  12 ml/hr Neo: 75 ml/hr  Medications reviewed and include: Pepcid, Senokot, IV antibiotics, Miralax  Labs reviewed: sodium 149 (H), hemoglobin 9.6 (L) CBG's: 111, 114, 92, 108 x 24 hours  UOP: 1495 ml x 24 hours CT output: 400 ml x 24 hours I/O's: +13.1 L since admit  Diet Order:   Diet Order            Diet NPO time specified  Diet effective now              EDUCATION NEEDS:   Not appropriate for education at this time  Skin:  Skin Assessment: Skin Integrity Issues: Stage I: coccyx 12/20/18  Last BM:  12/16/18  Height:   Ht Readings from Last 1 Encounters:  12/18/2018 '5\' 10"'  (1.778 m)    Weight:   Wt Readings from Last 1 Encounters:  12/24/18 80.8 kg    Ideal Body Weight:  75.5 kg  BMI:  Body mass index is 25.56 kg/m.  Estimated Nutritional Needs:   Kcal:  1643  Protein:  105-120 gm  Fluid:  per MD    Gaynell Face, MS, RD, LDN Inpatient Clinical Dietitian Pager: [phone] Weekend/After Hours: [phone]

## 2018-12-24 NOTE — Progress Notes (Signed)
Progress Note  Patient Name: Matthew Cuevas Date of Encounter: 12/24/2018  Primary Cardiologist: No primary care provider on file.   Subjective   Patient remains intubated and sedated  Inpatient Medications    Scheduled Meds: . arformoterol  15 mcg Nebulization BID  . aspirin  81 mg Per Tube Daily  . budesonide (PULMICORT) nebulizer solution  0.5 mg Nebulization BID  . chlorhexidine gluconate (MEDLINE KIT)  15 mL Mouth Rinse BID  . Chlorhexidine Gluconate Cloth  6 each Topical Daily  . enoxaparin (LOVENOX) injection  40 mg Subcutaneous Daily  . famotidine  20 mg Per Tube BID  . feeding supplement (PRO-STAT SUGAR FREE 64)  30 mL Per Tube BID  . insulin aspart  2-6 Units Subcutaneous Q4H  . levalbuterol  0.63 mg Nebulization Q6H  . mouth rinse  15 mL Mouth Rinse 10 times per day  . montelukast  10 mg Per Tube QHS  . sennosides  5 mL Per Tube BID  . sodium chloride flush  10-40 mL Intracatheter Q12H   Continuous Infusions: . sodium chloride    .  ceFAZolin (ANCEF) IV Stopped (12/24/18 0720)  . feeding supplement (VITAL AF 1.2 CAL) 1,000 mL (12/23/18 1808)  . fentaNYL infusion INTRAVENOUS 150 mcg/hr (12/24/18 0800)  . phenylephrine (NEO-SYNEPHRINE) Adult infusion 60 mcg/min (12/24/18 0800)   PRN Meds: Place/Maintain arterial line **AND** sodium chloride, fentaNYL (SUBLIMAZE) injection, levalbuterol, LORazepam, metoprolol tartrate, ondansetron **OR** [DISCONTINUED] ondansetron (ZOFRAN) IV, sodium chloride flush   Vital Signs    Vitals:   12/24/18 0730 12/24/18 0745 12/24/18 0758 12/24/18 0802  BP: (!) 78/39 (!) 97/54    Pulse: 77 94    Resp: 16 19    Temp:   98.6 F (37 C)   TempSrc:   Oral   SpO2: 100% 93%  99%  Weight:      Height:        Intake/Output Summary (Last 24 hours) at 12/24/2018 0904 Last data filed at 12/24/2018 0835 Gross per 24 hour  Intake 3184.26 ml  Output 1855 ml  Net 1329.26 ml   Last 3 Weights 12/24/2018 12/23/2018 12/22/2018  Weight  (lbs) 178 lb 2.1 oz 171 lb 11.8 oz 171 lb 11.8 oz  Weight (kg) 80.8 kg 77.9 kg 77.9 kg      Telemetry    Atrial fibrillation/flutter, ventricular rate controlled - Personally Reviewed   Physical Exam  Intubated sedated GEN: No acute distress.   Neck: No JVD Cardiac: irregular, no murmurs, rubs, or gallops.  Respiratory: Clear to auscultation bilaterally. GI: Soft, nontender, non-distended  MS: mild bilateral edema; No deformity. Neuro:  Nonfocal  Psych: Normal affect   Labs    Chemistry Recent Labs  Lab 12/20/18 0538 12/21/18 0609 12/22/18 0302 12/23/18 0247  NA 145 146* 146* 149*  K 3.6 4.0 4.1 3.9  CL 115* 118* 117* 117*  CO2 22 21* 23 26  GLUCOSE 131* 110* 111* 98  BUN 36* 43* 45* 38*  CREATININE 1.10 1.07 0.98 1.00  CALCIUM 8.4* 8.2* 8.2* 8.5*  PROT 5.6*  --   --   --   ALBUMIN 2.6*  --   --   --   AST 18  --   --   --   ALT 22  --   --   --   ALKPHOS 80  --   --   --   BILITOT 0.7  --   --   --   GFRNONAA >60 >60 >60 >  60  GFRAA >60 >60 >60 >60  ANIONGAP _0 Hematology Recent Labs  Lab 12/21/18 0609 12/22/18 0302 12/23/18 0247  WBC 16.3* 13.8* 14.7*  RBC 3.01* 2.85* 3.16*  HGB 9.2* 9.0* 9.6*  HCT 30.5* 29.1* 32.9*  MCV 101.3* 102.1* 104.1*  MCH 30.6 31.6 30.4  MCHC 30.2 30.9 29.2*  RDW 13.2 13.6 13.8  PLT 416* 381 340    Cardiac EnzymesNo results for input(s): TROPONINI in the last 168 hours. No results for input(s): TROPIPOC in the last 168 hours.   BNPNo results for input(s): BNP, PROBNP in the last 168 hours.   DDimer No results for input(s): DDIMER in the last 168 hours.   Radiology    Dg Chest Port 1 View  Result Date: 12/23/2018 CLINICAL DATA:  Central line placement EXAM: PORTABLE CHEST 1 VIEW COMPARISON:  12/23/2018 FINDINGS: Left jugular central venous catheter has been placed. The tip is difficult to visualize due to overlying EKG wire but appears to be in the SVC. Endotracheal tube remains in good position. NG tube  enters the stomach with the tip not visualized. Pigtail chest tube extending left apex unchanged. Small left apical pneumothorax slightly larger compared with earlier today. Extensive infiltrate in the left lung base unchanged. COPD with hyperinflation of the right upper lobe. Mild right lower lobe atelectasis/infiltrate appears improved. IMPRESSION: Left jugular central venous catheter has been placed with the tip probably in the SVC. Catheter not well seen due to overlying EKG wire. Small left apical pneumothorax slightly larger compared with earlier today Extensive infiltrate in the left lung is unchanged. Right lower lobe atelectasis/infiltrate with mild improvement. Electronically Signed   By: Franchot Gallo M.D.   On: 12/23/2018 12:27   Dg Chest Port 1 View  Result Date: 12/23/2018 CLINICAL DATA:  Hypoxia EXAM: PORTABLE CHEST 1 VIEW COMPARISON:  December 22, 2018. FINDINGS: Endotracheal tube tip is 3.8 cm above the carina. Pigtail catheter on the left is stable. Nasogastric tube tip and side port are below the diaphragm. Minimal left apical pneumothorax persists. There is subcutaneous air on the left, stable. There remains airspace opacity throughout much of the left lung with patchy infiltrate in the right base, stable. No new opacity evident. Heart size and pulmonary vascular normal. No adenopathy. There remain endobronchial valves on the left, stable in position IMPRESSION: Tube and catheter positions as described with trace left pneumothorax. Airspace opacity bilaterally, considerably more on the left than the right, stable. Stable cardiac silhouette. Subcutaneous air remains on the left. Electronically Signed   By: Lowella Grip III M.D.   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  I do not think he is a candidate for systemic anticoagulation because of his chest tube and plans for potential surgical intervention.  His heart rate is controlled.  We will continue to follow.  He continues to require Neo-Synephrine for blood pressure support but otherwise has remained clinically stable.  For questions or updates, please contact [place] Please consult [email or web address] for contact info under     Signed, Sherren Mocha, MD  12/24/2018, 9:04 AM

## 2018-12-24 NOTE — Progress Notes (Signed)
Patient was repositioned from his left side to his right side. After being repositioned there was a very audible air leak noted from patient's chest tube, ranging from 5-6 in the chamber. All connections assessed and tightened. [name], NP notified. O2 sats unchanged and patient appeared stable. CXR ordered and completed. Dr. Dorris Fetch notified when he arrived to unit. Will continue to monitor.  Leanna Battles, RN

## 2018-12-25 ENCOUNTER — Inpatient Hospital Stay (HOSPITAL_COMMUNITY): Payer: Medicare Other | Admitting: Anesthesiology

## 2018-12-25 ENCOUNTER — Inpatient Hospital Stay (HOSPITAL_COMMUNITY): Payer: Medicare Other

## 2018-12-25 ENCOUNTER — Encounter (HOSPITAL_COMMUNITY): Admission: AD | Disposition: E | Payer: Self-pay | Source: Other Acute Inpatient Hospital | Attending: Internal Medicine

## 2018-12-25 DIAGNOSIS — R6521 Severe sepsis with septic shock: Secondary | ICD-10-CM

## 2018-12-25 DIAGNOSIS — A419 Sepsis, unspecified organism: Secondary | ICD-10-CM

## 2018-12-25 DIAGNOSIS — J9311 Primary spontaneous pneumothorax: Secondary | ICD-10-CM | POA: Diagnosis not present

## 2018-12-25 HISTORY — PX: VIDEO ASSISTED THORACOSCOPY: SHX5073

## 2018-12-25 LAB — CBC
HCT: 29.2 % — ABNORMAL LOW (ref 39.0–52.0)
Hemoglobin: 8.5 g/dL — ABNORMAL LOW (ref 13.0–17.0)
MCH: 30.8 pg (ref 26.0–34.0)
MCHC: 29.1 g/dL — ABNORMAL LOW (ref 30.0–36.0)
MCV: 105.8 fL — ABNORMAL HIGH (ref 80.0–100.0)
Platelets: 312 10*3/uL (ref 150–400)
RBC: 2.76 MIL/uL — AB (ref 4.22–5.81)
RDW: 14 % (ref 11.5–15.5)
WBC: 13.4 10*3/uL — ABNORMAL HIGH (ref 4.0–10.5)
nRBC: 0 % (ref 0.0–0.2)

## 2018-12-25 LAB — BASIC METABOLIC PANEL
Anion gap: 5 (ref 5–15)
BUN: 34 mg/dL — ABNORMAL HIGH (ref 8–23)
CO2: 27 mmol/L (ref 22–32)
Calcium: 8.1 mg/dL — ABNORMAL LOW (ref 8.9–10.3)
Chloride: 115 mmol/L — ABNORMAL HIGH (ref 98–111)
Creatinine, Ser: 0.98 mg/dL (ref 0.61–1.24)
GFR calc non Af Amer: >60 mL/min (ref >60)
Glucose, Bld: 93 mg/dL (ref 70–99)
Potassium: 4.2 mmol/L (ref 3.5–5.1)
Sodium: 147 mmol/L — ABNORMAL HIGH (ref 135–145)

## 2018-12-25 LAB — POCT I-STAT 4, (NA,K, GLUC, HGB,HCT)
Glucose, Bld: 133 mg/dL — ABNORMAL HIGH (ref 70–99)
HCT: 22 % — ABNORMAL LOW (ref 39.0–52.0)
Hemoglobin: 7.5 g/dL — ABNORMAL LOW (ref 13.0–17.0)
Potassium: 4 mmol/L (ref 3.5–5.1)
Sodium: 148 mmol/L — ABNORMAL HIGH (ref 135–145)

## 2018-12-25 LAB — GLUCOSE, CAPILLARY
GLUCOSE-CAPILLARY: 115 mg/dL — AB (ref 70–99)
Glucose-Capillary: 103 mg/dL — ABNORMAL HIGH (ref 70–99)
Glucose-Capillary: 129 mg/dL — ABNORMAL HIGH (ref 70–99)
Glucose-Capillary: 93 mg/dL (ref 70–99)

## 2018-12-25 LAB — PREPARE RBC (CROSSMATCH)

## 2018-12-25 SURGERY — VIDEO ASSISTED THORACOSCOPY
Anesthesia: General | Site: Chest | Laterality: Left

## 2018-12-25 MED ORDER — VANCOMYCIN HCL IN DEXTROSE 1-5 GM/200ML-% IV SOLN
1000.0000 mg | INTRAVENOUS | Status: AC
  Administered 2018-12-25: 1000 mg via INTRAVENOUS
  Filled 2018-12-25: qty 200

## 2018-12-25 MED ORDER — HEMOSTATIC AGENTS (NO CHARGE) OPTIME
TOPICAL | Status: DC | PRN
  Administered 2018-12-25 (×5): 1 via TOPICAL

## 2018-12-25 MED ORDER — ALBUTEROL SULFATE HFA 108 (90 BASE) MCG/ACT IN AERS
INHALATION_SPRAY | RESPIRATORY_TRACT | Status: DC | PRN
  Administered 2018-12-25 (×2): 8 via RESPIRATORY_TRACT

## 2018-12-25 MED ORDER — MIDAZOLAM HCL 2 MG/2ML IJ SOLN
INTRAMUSCULAR | Status: AC
  Filled 2018-12-25: qty 2

## 2018-12-25 MED ORDER — LACTATED RINGERS IV SOLN
INTRAVENOUS | Status: DC | PRN
  Administered 2018-12-25: 10:00:00 via INTRAVENOUS

## 2018-12-25 MED ORDER — DEXAMETHASONE SODIUM PHOSPHATE 10 MG/ML IJ SOLN
INTRAMUSCULAR | Status: AC
  Filled 2018-12-25: qty 1

## 2018-12-25 MED ORDER — BUPIVACAINE HCL (PF) 0.5 % IJ SOLN
INTRAMUSCULAR | Status: AC
  Filled 2018-12-25: qty 30

## 2018-12-25 MED ORDER — ROCURONIUM BROMIDE 50 MG/5ML IV SOSY
PREFILLED_SYRINGE | INTRAVENOUS | Status: AC
  Filled 2018-12-25: qty 10

## 2018-12-25 MED ORDER — BUPIVACAINE HCL (PF) 0.5 % IJ SOLN
INTRAMUSCULAR | Status: DC | PRN
  Administered 2018-12-25: 30 mL

## 2018-12-25 MED ORDER — 0.9 % SODIUM CHLORIDE (POUR BTL) OPTIME
TOPICAL | Status: DC | PRN
  Administered 2018-12-25: 3000 mL

## 2018-12-25 MED ORDER — LEVALBUTEROL HCL 0.63 MG/3ML IN NEBU: 0.6300 mg | INHALATION_SOLUTION | RESPIRATORY_TRACT | Status: DC

## 2018-12-25 MED ORDER — SODIUM CHLORIDE (PF) 0.9 % IJ SOLN
INTRAMUSCULAR | Status: DC | PRN
  Administered 2018-12-25: 50 mL

## 2018-12-25 MED ORDER — ROCURONIUM BROMIDE 50 MG/5ML IV SOSY
PREFILLED_SYRINGE | INTRAVENOUS | Status: AC
  Filled 2018-12-25: qty 5

## 2018-12-25 MED ORDER — FENTANYL CITRATE (PF) 250 MCG/5ML IJ SOLN
INTRAMUSCULAR | Status: AC
  Filled 2018-12-25: qty 5

## 2018-12-25 MED ORDER — PROPOFOL 10 MG/ML IV BOLUS
INTRAVENOUS | Status: AC
  Filled 2018-12-25: qty 20

## 2018-12-25 MED ORDER — PHENYLEPHRINE 40 MCG/ML (10ML) SYRINGE FOR IV PUSH (FOR BLOOD PRESSURE SUPPORT)
PREFILLED_SYRINGE | INTRAVENOUS | Status: AC
  Filled 2018-12-25: qty 10

## 2018-12-25 MED ORDER — ONDANSETRON HCL 4 MG/2ML IJ SOLN
INTRAMUSCULAR | Status: AC
  Filled 2018-12-25: qty 2

## 2018-12-25 MED ORDER — STERILE WATER FOR INJECTION IJ SOLN
INTRAMUSCULAR | Status: AC
  Filled 2018-12-25: qty 10

## 2018-12-25 MED ORDER — MIDAZOLAM HCL 2 MG/2ML IJ SOLN
2.0000 mg | INTRAMUSCULAR | Status: DC | PRN
  Administered 2018-12-25 – 2018-12-29 (×11): 2 mg via INTRAVENOUS
  Filled 2018-12-25 (×11): qty 2

## 2018-12-25 MED ORDER — PHENYLEPHRINE 40 MCG/ML (10ML) SYRINGE FOR IV PUSH (FOR BLOOD PRESSURE SUPPORT)
PREFILLED_SYRINGE | INTRAVENOUS | Status: DC | PRN
  Administered 2018-12-25 (×2): 120 ug via INTRAVENOUS
  Administered 2018-12-25 (×2): 80 ug via INTRAVENOUS

## 2018-12-25 MED ORDER — BUPIVACAINE LIPOSOME 1.3 % IJ SUSP
20.0000 mL | INTRAMUSCULAR | Status: AC
  Administered 2018-12-25: 20 mL
  Filled 2018-12-25 (×2): qty 20

## 2018-12-25 MED ORDER — LIDOCAINE 2% (20 MG/ML) 5 ML SYRINGE
INTRAMUSCULAR | Status: AC
  Filled 2018-12-25: qty 5

## 2018-12-25 MED ORDER — ROCURONIUM BROMIDE 50 MG/5ML IV SOSY
PREFILLED_SYRINGE | INTRAVENOUS | Status: DC | PRN
  Administered 2018-12-25: 20 mg via INTRAVENOUS
  Administered 2018-12-25: 30 mg via INTRAVENOUS
  Administered 2018-12-25 (×2): 50 mg via INTRAVENOUS

## 2018-12-25 MED ORDER — ALBUMIN HUMAN 5 % IV SOLN
INTRAVENOUS | Status: DC | PRN
  Administered 2018-12-25 (×2): via INTRAVENOUS

## 2018-12-25 MED ORDER — PROPOFOL 10 MG/ML IV BOLUS
INTRAVENOUS | Status: DC | PRN
  Administered 2018-12-25: 70 mg via INTRAVENOUS

## 2018-12-25 MED ORDER — MIDAZOLAM HCL 2 MG/2ML IJ SOLN
INTRAMUSCULAR | Status: DC | PRN
  Administered 2018-12-25: 2 mg via INTRAVENOUS

## 2018-12-25 MED ORDER — SODIUM CHLORIDE 0.9% IV SOLUTION: Freq: Once | INTRAVENOUS | Status: DC

## 2018-12-25 SURGICAL SUPPLY — 91 items
APPLICATOR COTTON TIP 6 STRL (MISCELLANEOUS) IMPLANT
APPLICATOR COTTON TIP 6IN STRL (MISCELLANEOUS) IMPLANT
APPLIER CLIP ROT 10 11.4 M/L (STAPLE)
CANISTER SUCT 3000ML PPV (MISCELLANEOUS) ×3 IMPLANT
CATH THORACIC 28FR (CATHETERS) ×2 IMPLANT
CATH THORACIC 28FR RT ANG (CATHETERS) IMPLANT
CATH THORACIC 36FR (CATHETERS) IMPLANT
CATH THORACIC 36FR RT ANG (CATHETERS) IMPLANT
CLIP APPLIE ROT 10 11.4 M/L (STAPLE) IMPLANT
CLIP VESOCCLUDE MED 6/CT (CLIP) IMPLANT
CONN Y 3/8X3/8X3/8  BEN (MISCELLANEOUS)
CONN Y 3/8X3/8X3/8 BEN (MISCELLANEOUS) ×1 IMPLANT
CONT SPEC 4OZ CLIKSEAL STRL BL (MISCELLANEOUS) ×6 IMPLANT
COVER SURGICAL LIGHT HANDLE (MISCELLANEOUS) ×3 IMPLANT
COVER WAND RF STERILE (DRAPES) ×3 IMPLANT
DERMABOND ADVANCED (GAUZE/BANDAGES/DRESSINGS) ×2
DERMABOND ADVANCED .7 DNX12 (GAUZE/BANDAGES/DRESSINGS) IMPLANT
DRAIN CHANNEL 28F RND 3/8 FF (WOUND CARE) IMPLANT
DRAIN CHANNEL 32F RND 10.7 FF (WOUND CARE) ×2 IMPLANT
DRAPE LAPAROSCOPIC ABDOMINAL (DRAPES) ×3 IMPLANT
DRAPE SLUSH/WARMER DISC (DRAPES) ×3 IMPLANT
DRSG EMULSION OIL 3X3 NADH (GAUZE/BANDAGES/DRESSINGS) ×2 IMPLANT
ELECT REM PT RETURN 9FT ADLT (ELECTROSURGICAL) ×3
ELECTRODE REM PT RTRN 9FT ADLT (ELECTROSURGICAL) ×1 IMPLANT
FELT TEFLON 1X6 (MISCELLANEOUS) ×2 IMPLANT
GAUZE SPONGE 4X4 12PLY STRL (GAUZE/BANDAGES/DRESSINGS) ×3 IMPLANT
GLOVE BIO SURGEON STRL SZ 6.5 (GLOVE) ×1 IMPLANT
GLOVE BIO SURGEONS STRL SZ 6.5 (GLOVE) ×1
GLOVE SURG SIGNA 7.5 PF LTX (GLOVE) ×6 IMPLANT
GOWN STRL REUS W/ TWL LRG LVL3 (GOWN DISPOSABLE) ×2 IMPLANT
GOWN STRL REUS W/ TWL XL LVL3 (GOWN DISPOSABLE) ×1 IMPLANT
GOWN STRL REUS W/TWL LRG LVL3 (GOWN DISPOSABLE) ×4
GOWN STRL REUS W/TWL XL LVL3 (GOWN DISPOSABLE) ×2
HANDLE STAPLE ENDO GIA SHORT (STAPLE) ×2
HEMOSTAT SURGICEL 2X14 (HEMOSTASIS) IMPLANT
IV CATH 22GX1 FEP (IV SOLUTION) IMPLANT
KIT BASIN OR (CUSTOM PROCEDURE TRAY) ×3 IMPLANT
KIT SUCTION CATH 14FR (SUCTIONS) ×1 IMPLANT
KIT TURNOVER KIT B (KITS) ×3 IMPLANT
NDL SPNL 18GX3.5 QUINCKE PK (NEEDLE) ×1 IMPLANT
NEEDLE SPNL 18GX3.5 QUINCKE PK (NEEDLE) ×3 IMPLANT
NS IRRIG 1000ML POUR BTL (IV SOLUTION) ×6 IMPLANT
PACK CHEST (CUSTOM PROCEDURE TRAY) ×3 IMPLANT
PAD ARMBOARD 7.5X6 YLW CONV (MISCELLANEOUS) ×6 IMPLANT
POUCH ENDO CATCH II 15MM (MISCELLANEOUS) IMPLANT
POUCH SPECIMEN RETRIEVAL 10MM (ENDOMECHANICALS) IMPLANT
PROGEL SPRAY TIP 11IN (MISCELLANEOUS) ×9
RELOAD EGIA 45 MED/THCK PURPLE (STAPLE) ×2 IMPLANT
RELOAD STAPLE 30 PURP MED/THCK (STAPLE) IMPLANT
RELOAD TRI 2.0 30 MED THCK SUL (STAPLE) ×3 IMPLANT
RELOAD TRI 60 ART MED THCK BLK (STAPLE) ×4 IMPLANT
RELOAD TRI 60 ART MED THCK PUR (STAPLE) ×6 IMPLANT
SCISSORS LAP 5X35 DISP (ENDOMECHANICALS) ×2 IMPLANT
SEALANT PROGEL (MISCELLANEOUS) ×4 IMPLANT
SEALANT SURG COSEAL 4ML (VASCULAR PRODUCTS) IMPLANT
SEALANT SURG COSEAL 8ML (VASCULAR PRODUCTS) IMPLANT
SOLUTION ANTI FOG 6CC (MISCELLANEOUS) ×3 IMPLANT
SPECIMEN JAR MEDIUM (MISCELLANEOUS) ×1 IMPLANT
SPONGE INTESTINAL PEANUT (DISPOSABLE) IMPLANT
SPONGE TONSIL TAPE 1 RFD (DISPOSABLE) ×3 IMPLANT
STAPLER ENDO GIA 12 SHRT THIN (STAPLE) IMPLANT
STAPLER ENDO GIA 12MM SHORT (STAPLE) ×1 IMPLANT
SUT ETHIBOND NAB MH 2-0 36IN (SUTURE) ×8 IMPLANT
SUT PROLENE 4 0 RB 1 (SUTURE)
SUT PROLENE 4-0 RB1 .5 CRCL 36 (SUTURE) IMPLANT
SUT SILK  1 MH (SUTURE) ×6
SUT SILK 1 MH (SUTURE) ×2 IMPLANT
SUT SILK 2 0SH CR/8 30 (SUTURE) IMPLANT
SUT SILK 3 0SH CR/8 30 (SUTURE) IMPLANT
SUT VIC AB 1 CTX 36 (SUTURE)
SUT VIC AB 1 CTX36XBRD ANBCTR (SUTURE) IMPLANT
SUT VIC AB 2-0 CT1 27 (SUTURE) ×4
SUT VIC AB 2-0 CT1 TAPERPNT 27 (SUTURE) IMPLANT
SUT VIC AB 2-0 CTX 36 (SUTURE) IMPLANT
SUT VIC AB 2-0 UR6 27 (SUTURE) IMPLANT
SUT VIC AB 3-0 MH 27 (SUTURE) IMPLANT
SUT VIC AB 3-0 X1 27 (SUTURE) ×3 IMPLANT
SUT VICRYL 2 TP 1 (SUTURE) IMPLANT
SYR 10ML KIT SKIN ADHESIVE (MISCELLANEOUS) ×2 IMPLANT
SYR 20CC LL (SYRINGE) IMPLANT
SYSTEM SAHARA CHEST DRAIN ATS (WOUND CARE) ×3 IMPLANT
TAPE CLOTH 4X10 WHT NS (GAUZE/BANDAGES/DRESSINGS) ×3 IMPLANT
TAPE CLOTH SURG 4X10 WHT LF (GAUZE/BANDAGES/DRESSINGS) ×2 IMPLANT
TIP APPLICATOR SPRAY EXTEND 16 (VASCULAR PRODUCTS) IMPLANT
TIP SPRAY PROGEL 11IN (MISCELLANEOUS) IMPLANT
TOWEL GREEN STERILE (TOWEL DISPOSABLE) ×3 IMPLANT
TOWEL GREEN STERILE FF (TOWEL DISPOSABLE) ×3 IMPLANT
TRAY FOLEY MTR SLVR 16FR STAT (SET/KITS/TRAYS/PACK) ×3 IMPLANT
TROCAR XCEL BLADELESS 5X75MML (TROCAR) ×3 IMPLANT
TROCAR XCEL NON-BLD 5MMX100MML (ENDOMECHANICALS) IMPLANT
WATER STERILE IRR 1000ML POUR (IV SOLUTION) ×6 IMPLANT

## 2018-12-25 NOTE — Anesthesia Procedure Notes (Signed)
Arterial Line Insertion Start/End3/20/2020 11:00 AM Performed by: Lewie Loron, MD, anesthesiologist  Preanesthetic checklist: patient identified, IV checked, site marked, risks and benefits discussed, surgical consent, monitors and equipment checked, pre-op evaluation, timeout performed and anesthesia consent Left, radial was placed Catheter size: 20 G Hand hygiene performed  and maximum sterile barriers used   Attempts: 1 Procedure performed using ultrasound guided technique. Ultrasound Notes:anatomy identified, needle tip was noted to be adjacent to the nerve/plexus identified and no ultrasound evidence of intravascular and/or intraneural injection Following insertion, dressing applied and Biopatch. Post procedure assessment: normal  Patient tolerated the procedure well with no immediate complications.

## 2018-12-25 NOTE — Anesthesia Preprocedure Evaluation (Signed)
Anesthesia Evaluation  Patient identified by MRN, date of birth, ID band  Reviewed: Allergy & Precautions, Patient's Chart, lab work & pertinent test resultsPreop documentation limited or incomplete due to emergent nature of procedure.  Airway Mallampati: Intubated  TM Distance: >3 FB Neck ROM: Full  Mouth opening: Limited Mouth Opening  Dental no notable dental hx. (+) Teeth Intact, Loose, Dental Advisory Given, Poor Dentition,    Pulmonary COPD, former smoker,    Pulmonary exam normal breath sounds clear to auscultation       Cardiovascular hypertension, Pt. on medications Normal cardiovascular exam Rhythm:Regular Rate:Normal  12/25/2018 Echo  1. The left ventricle has hyperdynamic systolic function, with an ejection fraction of >65%. The cavity size was normal. Left ventricular diastolic function could not be evaluated.  2. The right ventricle has normal systolic function. The cavity was normal. There is no increase in right ventricular wall thickness.  3. Small pericardial effusion.  4. The pericardial effusion is localized near the right ventricle and surrounding the apex.   Neuro/Psych    GI/Hepatic   Endo/Other    Renal/GU      Musculoskeletal   Abdominal   Peds  Hematology   Anesthesia Other Findings   Reproductive/Obstetrics                            Anesthesia Physical Anesthesia Plan  ASA: III  Anesthesia Plan: General   Post-op Pain Management:    Induction: Inhalational  PONV Risk Score and Plan:   Airway Management Planned: Double Lumen EBT  Additional Equipment: Arterial line  Intra-op Plan:   Post-operative Plan: Post-operative intubation/ventilation  Informed Consent: I have reviewed the patients History and Physical, chart, labs and discussed the procedure including the risks, benefits and alternatives for the proposed anesthesia with the patient or authorized  representative who has indicated his/her understanding and acceptance.     Only emergency history available  Plan Discussed with: CRNA  Anesthesia Plan Comments:         Anesthesia Quick Evaluation

## 2018-12-25 NOTE — Progress Notes (Signed)
NAME:  Matthew Cuevas, MRN:  [id number], DOB:  1941-10-02, LOS: 16 ADMISSION DATE:  12/31/2018, CONSULTATION DATE:  12/18/2018 REFERRING MD:  Jonathon Bellows, CHIEF COMPLAINT:  Shortness of breath  Brief History   78 yo WM COPD with a BPF s/p bleb rupture 2/23. Failed chemical pleurodesis and EBV.  Past Medical History  He,  has a past medical history of COPD (chronic obstructive pulmonary disease) (HCC) and HTN (hypertension).   Significant Hospital Events   PEA arrest at induction today 3/13. Re-intubated.  3/14: developed SVT during weaning attempt so aborted. Started on vanc for staph in sputum.   Consults:    Procedures:  01/02/2019 plan for return to the OR  Significant Diagnostic Tests:  CT  Micro Data:  Sputum culture 3/13: MSSA Blood culture 3/6: This was negative Urine culture 3/6: Enterococcus faecalis 30,000 colony count  Antimicrobials:  Cefepime 3/13>>3/16 Ancef 3/16>>> Vancomycin 3/14>>>3/16  Interim history/subjective:  Currently heavy sedation going back to the OR therefore no weaning  Objective   Blood pressure (!) 115/52, pulse 100, temperature 99.4 F (37.4 C), temperature source Oral, resp. rate 20, height 5\' 10"  (1.778 m), weight 81.3 kg, SpO2 100 %.    Vent Mode: PRVC FiO2 (%):  [40 %] 40 % Set Rate:  [16 bmp] 16 bmp Vt Set:  [580 mL] 580 mL PEEP:  [5 cmH20] 5 cmH20 Pressure Support:  [5 cmH20] 5 cmH20 Plateau Pressure:  [13 cmH20-26 cmH20] 13 cmH20   Intake/Output Summary (Last 24 hours) at 12/19/2018 1029 Last data filed at 12/15/2018 0900 Gross per 24 hour  Intake 3461.14 ml  Output 1310 ml  Net 2151.14 ml   Filed Weights   12/23/18 0200 12/24/18 0500 01/02/2019 0345  Weight: 77.9 kg 80.8 kg 81.3 kg   Physical Exam: General: Ill-appearing elderly male  HEENT: Endotracheal tube gastric tube in place.  No JVD is appreciated Neuro: Currently heavily sedated CV: Sounds are regular ventricular rate of 94 PULM: even/non-labored, lungs bilaterally  diminished in the bases left chest tube with minimal air leak and scant drainage CW:CBJS, non-tender, bsx4 active  Extremities: warm/dry, 2+ edema  Skin: no rashes or lesions   Resolved Hospital Problem list   PEA arrest (respiratory and anesthesia related 3/13)  Assessment & Plan:   Agitation requiring sedation Plan Wean fentanyl as tolerated  Acute Hypoxic respiratory failure 2/2 AECOPD, MSSA PNA vs ATX and persistent left PTX w/ BPF.  Difficult to wean due to hemodynamic instability (tachypnea, episodes SVT) during weaning CXR 3/18 unchanged left infiltrate, unchanged small left apical pneumothorax, L IJ CVC Plan Continue full vent support he is to return to the OR today on 12/20/2018 therefore no weaning or extubation Chest tube continues to have small air leak this will be addressed in the operating room Assess for possible weaning and extubation on a daily basis Serial chest x-ray  Hypotension Likely exacerbated by sedating medications Plan -Continue Neo, titrate for MAP > 60 -Wean sedation as tolerated -CVC placed today  SVT SVT runs occurring with vent weaning Plan Cardiac monitoring Cardiology is following Currently on Metroprolol, heart rate is currently 74 Using Neo-Synephrine when needed for vasopressor support   Hyperglycemia CBG (last 3)  Recent Labs    12/07/2018 0043 12/06/2018 0350 12/21/2018 0814  GLUCAP 129* 93 115*    Plan On sliding scale insulin per protocol  GOC CV TS and CCM spoken to the family plan is to return to surgery today 12/19/2018   Best practice:  Diet:  enteral nutrition Pain/Anxiety/Delirium protocol (if indicated): fentanyl gtt, PRN fentanyl  PRN versed  VAP protocol (if indicated): Continue  DVT prophylaxis: SCD GI prophylaxis: famotidine  Glucose control: SSI  Mobility: As tolerated  Code Status: Full Family Communication: Discussed care plan with wife. She requests family meeting with both CTS and Critical Care team to  discuss overall  Disposition: Continue ICU level of care   App Critical Care: 50 min   Brett Canales Chitara Clonch ACNP [facility] PCCM Pager [phone] till 1 pm If no answer page [phone] 12/24/2018, 10:30 AM

## 2018-12-25 NOTE — Progress Notes (Signed)
Progress Note  Patient Name: Matthew Cuevas Date of Encounter: 01/03/2019  Primary Cardiologist: No primary care provider on file.   Subjective   Patient seems more sedated today.  Wife at bedside.  Inpatient Medications    Scheduled Meds: . arformoterol  15 mcg Nebulization BID  . aspirin  81 mg Per Tube Daily  . budesonide (PULMICORT) nebulizer solution  0.5 mg Nebulization BID  . chlorhexidine gluconate (MEDLINE KIT)  15 mL Mouth Rinse BID  . Chlorhexidine Gluconate Cloth  6 each Topical Daily  . enoxaparin (LOVENOX) injection  40 mg Subcutaneous Daily  . famotidine  20 mg Per Tube BID  . feeding supplement (PRO-STAT SUGAR FREE 64)  30 mL Per Tube BID  . insulin aspart  2-6 Units Subcutaneous Q4H  . levalbuterol  0.63 mg Nebulization Q6H  . mouth rinse  15 mL Mouth Rinse 10 times per day  . montelukast  10 mg Per Tube QHS  . polyethylene glycol  17 g Oral Daily  . QUEtiapine  25 mg Oral QHS  . sennosides  5 mL Per Tube BID  . sodium chloride flush  10-40 mL Intracatheter Q12H   Continuous Infusions: . sodium chloride    . sodium chloride 10 mL/hr at 12/23/2018 0700  .  ceFAZolin (ANCEF) IV Stopped (12/28/2018 0557)  . feeding supplement (VITAL AF 1.2 CAL) Stopped (12/11/2018 0000)  . fentaNYL infusion INTRAVENOUS 100 mcg/hr (12/12/2018 0700)  . phenylephrine (NEO-SYNEPHRINE) Adult infusion 70 mcg/min (12/06/2018 0814)  . vancomycin     PRN Meds: Place/Maintain arterial line **AND** sodium chloride, sodium chloride, fentaNYL (SUBLIMAZE) injection, levalbuterol, LORazepam, metoprolol tartrate, ondansetron **OR** [DISCONTINUED] ondansetron (ZOFRAN) IV, sodium chloride flush   Vital Signs    Vitals:   12/18/2018 0645 12/24/2018 0700 12/11/2018 0715 12/27/2018 0805  BP: (!) 98/42 (!) 87/42 (!) 97/44   Pulse: 74 74 76   Resp: _0 Temp:    99.4 F (37.4 C)  TempSrc:    Oral  SpO2: 100% 100% 100%   Weight:      Height:        Intake/Output Summary (Last 24 hours) at  12/11/2018 0828 Last data filed at 12/24/2018 0700 Gross per 24 hour  Intake 3447.39 ml  Output 1470 ml  Net 1977.39 ml   Last 3 Weights 01/03/2019 12/24/2018 12/23/2018  Weight (lbs) 179 lb 3.7 oz 178 lb 2.1 oz 171 lb 11.8 oz  Weight (kg) 81.3 kg 80.8 kg 77.9 kg      Telemetry    Sinus rhythm/sinus tachycardia heart rate 105 bpm- Personally Reviewed   Physical Exam  Sedated/intubated GEN: No acute distress.   Neck: No JVD Cardiac:  Tachy and regular, distant heart sounds, no murmurs, rubs, or gallops.  Respiratory:  Rhonchi bilaterally. GI: Soft, no masses, non-distended  MS:  1+ diffuse upper and lower extremity edema; No deformity.  Labs    Chemistry Recent Labs  Lab 12/20/18 0538  12/22/18 0302 12/23/18 0247 12/30/2018 0353  NA 145   < > 146* 149* 147*  K 3.6   < > 4.1 3.9 4.2  CL 115*   < > 117* 117* 115*  CO2 22   < > _1 GLUCOSE 131*   < > 111* 98 93  BUN 36*   < > 45* 38* 34*  CREATININE 1.10   < > 0.98 1.00 0.98  CALCIUM 8.4*   < > 8.2* 8.5* 8.1*  PROT 5.6*  --   --   --   --  ALBUMIN 2.6*  --   --   --   --   AST 18  --   --   --   --   ALT 22  --   --   --   --   ALKPHOS 80  --   --   --   --   BILITOT 0.7  --   --   --   --   GFRNONAA >60   < > >60 >60 >60  GFRAA >60   < > >60 >60 >60  ANIONGAP 8   < > _0 < > = values in this interval not displayed.     Hematology Recent Labs  Lab 12/22/18 0302 12/23/18 0247 12/24/2018 0353  WBC 13.8* 14.7* 13.4*  RBC 2.85* 3.16* 2.76*  HGB 9.0* 9.6* 8.5*  HCT 29.1* 32.9* 29.2*  MCV 102.1* 104.1* 105.8*  MCH 31.6 30.4 30.8  MCHC 30.9 29.2* 29.1*  RDW 13.6 13.8 14.0  PLT 381 340 312    Cardiac EnzymesNo results for input(s): TROPONINI in the last 168 hours. No results for input(s): TROPIPOC in the last 168 hours.   BNPNo results for input(s): BNP, PROBNP in the last 168 hours.   DDimer No results for input(s): DDIMER in the last 168 hours.   Radiology    Dg Chest Port 1 View  Result Date:  12/24/2018 CLINICAL DATA:  Follow-up pneumothorax EXAM: PORTABLE CHEST 1 VIEW COMPARISON:  12/23/2018 FINDINGS: Cardiac shadow is stable. Endotracheal tube and gastric catheter are again seen and stable as is a left jugular central line. Left pigtail chest tube is noted. No significant pneumothorax is noted at this time. Diffuse infiltrate is again seen throughout the left lung stable from the previous exam. No new focal infiltrate is seen. Diffuse emphysematous changes are noted. Multiple bronchial bowels are noted on the left. IMPRESSION: Tubes and lines as described above. No sizable pneumothorax is seen. Stable left-sided infiltrate. Electronically Signed   By: Inez Catalina M.D.   On: 12/24/2018 12:05   Dg Chest Port 1 View  Result Date: 12/23/2018 CLINICAL DATA:  Central line placement EXAM: PORTABLE CHEST 1 VIEW COMPARISON:  12/23/2018 FINDINGS: Left jugular central venous catheter has been placed. The tip is difficult to visualize due to overlying EKG wire but appears to be in the SVC. Endotracheal tube remains in good position. NG tube enters the stomach with the tip not visualized. Pigtail chest tube extending left apex unchanged. Small left apical pneumothorax slightly larger compared with earlier today. Extensive infiltrate in the left lung base unchanged. COPD with hyperinflation of the right upper lobe. Mild right lower lobe atelectasis/infiltrate appears improved. IMPRESSION: Left jugular central venous catheter has been placed with the tip probably in the SVC. Catheter not well seen due to overlying EKG wire. Small left apical pneumothorax slightly larger compared with earlier today Extensive infiltrate in the left lung is unchanged. Right lower lobe atelectasis/infiltrate with mild improvement. Electronically Signed   By: Franchot Gallo M.D.   On: 12/23/2018 12:27     Patient Profile     78 y.o. male male with PSVT and now atrial fibrillation/flutter in the setting of left pneumothorax and  respiratory failure  Assessment & Plan    Paroxysmal atrial flutter with variable AV block: This is demonstrated on yesterday's EKG.  I reviewed his telemetry today and he has been back in normal sinus rhythm with periods of sinus tachycardia.  He continues to require Neo-Synephrine  for blood pressure support while he is sedated on the ventilator.  He otherwise has been hemodynamically stable and has not required escalation of pressor support.  Plans noted for VATS surgery today.  Discussed with wife at bedside.  No anticoagulation for atrial arrhythmia with pending surgery today.  For questions or updates, please contact [place] Please consult [email or web address] for contact info under     Signed, Sherren Mocha, MD  12/27/2018, 8:28 AM

## 2018-12-25 NOTE — Brief Op Note (Addendum)
01/01/2019 - 12/22/2018  2:36 PM  PATIENT:  Matthew Cuevas  78 y.o. male  PRE-OPERATIVE DIAGNOSIS:  Pneumothorax, prolonged air leak  POST-OPERATIVE DIAGNOSIS:  Pneumothorax, prolonged air leak  PROCEDURE:  Procedure(s):  >LEFT VIDEO ASSISTED THORACOSCOPY -Stapling of Blebs -Pleural Tent  SURGEON:  Surgeon(s) and Role:    * [name], MD - Primary  PHYSICIAN ASSISTANT: Lowella Dandy PA-C  ANESTHESIA:   general  EBL:  500 mL   BLOOD ADMINISTERED:1 unit of packed cells CC PRBC  DRAINS: Left Pigtail Catheter, 2 Left sided chest tubes   LOCAL MEDICATIONS USED:  BUPIVICAINE   SPECIMEN:  Source of Specimen:  Blebs LUL  DISPOSITION OF SPECIMEN:  PATHOLOGY  COUNTS:  YES  TOURNIQUET:  * No tourniquets in log *  DICTATION: .Dragon Dictation  PLAN OF CARE: Admit to inpatient   PATIENT DISPOSITION:  ICU - intubated and hemodynamically stable.   Delay start of Pharmacological VTE agent (>24hrs) due to surgical blood loss or risk of bleeding: yes

## 2018-12-25 NOTE — Interval H&P Note (Signed)
History and Physical Interval Note:  01/01/2019 10:53 AM  Matthew Cuevas  has presented today for surgery, with the diagnosis of Pneumothorax, prolonged air leak.  The various methods of treatment have been discussed with the patient and family. After consideration of risks, benefits and other options for treatment, the patient has consented to  Procedure(s): VIDEO ASSISTED THORACOSCOPY, Stapling of Blebs (Left) as a surgical intervention.  [name] history has been reviewed, patient examined, no change in status, stable for surgery.  I have reviewed the patient's chart and labs.  Questions were answered to the patient's satisfaction.     [name]

## 2018-12-25 NOTE — Op Note (Signed)
NAME: Matthew Cuevas, Matthew Cuevas MEDICAL RECORD [id number] ACCOUNT [id number] DATE OF BIRTH:Jul 06, 1941 FACILITY: [facility] LOCATION: [facility] PHYSICIAN:Kathryn Cosby Lars Pinks, MD  OPERATIVE REPORT  DATE OF PROCEDURE:  12/25/2018  PREOPERATIVE DIAGNOSIS:  Spontaneous pneumothorax with prolonged air leak.  POSTOPERATIVE DIAGNOSIS:  Spontaneous pneumothorax with prolonged air leak.  PROCEDURE:  Left video-assisted thoracoscopy, resection of blebs from left upper lobe, pleural tent.  SURGEON:  Charlett Lango, MD  ASSISTANT:  Lowella Dandy, PA  ANESTHESIA:  General.  FINDINGS:  Adhesions at the site of previous pleurodesis. Left lower lobe never deflated.  Left upper lobe was extremely fragile, multiple tears from takedown of adhesions and stapling entire anterior and apical portion of the upper lobe. Severe emphysematous blebs.  CLINICAL NOTE:  Matthew Cuevas is a 78 year old gentleman with a history of tobacco abuse and COPD who presented to [facility] with a spontaneous pneumothorax.  He had multiple tubes placed there.  An attempt was made to do doxycycline  pleurodesis, but he had an adverse reaction.  He was transferred to [facility] for further management.  He had a large anterior pneumothorax.  A pigtail catheter was placed for that.  His air leak persisted.  He was advised to undergo VATS for  stapling of blebs.  On induction, he lost his pulse.  CPR was performed.  He was resuscitated.  The procedure was canceled.  He developed multiple broken ribs.  He developed splinting and acute on chronic respiratory failure and required intubation.   Post-intubation, he was diagnosed with Staphylococcus aureus pneumonia, which has been treated with antibiotics.  He still has a persistent air leak.  The family was offered the option for attempted surgical correction of that with VATS for stapling of  blebs.  The indications, risks, benefits, and alternatives were discussed in detail with  the patient.  They were fully aware of the high risk nature of the procedure.  They also were aware that the air leak was not responsible for his failure to wean  from the ventilator.  OPERATIVE NOTE:  The patient was brought from the ICU to the operating room on 12/19/2018.  Anesthesia placed an arterial blood pressure monitor line.  In addition to the Ancef that he was already on, he was given intravenous vancomycin.  The patient was  intubated on arrival.  After induction of general anesthesia, the single-lumen tube was swapped out for a double-lumen endotracheal tube.  That was done without incident.  He was placed in a right lateral decubitus position.  The left chest was prepped and  draped in the usual sterile fashion.  Sequential compression devices and a Foley catheter were already in place.  Single-lung ventilation of the right lung was initiated and was tolerated well throughout the procedure.  The left chest was prepped and  draped in the usual sterile fashion.  A timeout was performed.  A solution containing 20 mL of liposomal bupivacaine and 30 mL of 0.5% bupivacaine was used for local anesthesia at the incision site.  An incision was made in the 7th interspace in the midaxillary line.  The chest was bluntly  entered using a hemostat.  A port was inserted and the thoracoscope was advanced into the chest.  Initially, there was poor isolation of the left lung.  That ultimately improved with repositioning of the endotracheal tube.  A small working incision was  injected with the bupivacaine solution, and an incision was made.  No rib spreading was performed during the procedure.  There  were some mild adhesions anteriorly, which were easily taken down.  The upper lobe did deflate well, but the lower lobe was  tense and never did become atelectatic during the procedure.  Inspection of the lower lobe was limited to the anterolateral aspect where no blebs or air leaks were seen.  The upper lobe  visceral pleura was extremely fragile and tore multiple times, even  with very minimal traction.  Laterally, where the previous chest tube had been placed, there was a dense layer of adhesions between the lung and the chest wall where there was a pleurodesis achieved over a very small area.  This had to be taken down to  approach the apex.  There also were adhesions of the blebs to the apex.  As these were taken down, there was a tear in one of the blebs, and significant bleeding occurred.  A Covidien stapler with a purple cartridge was used to control that bleeding.   The entire anterior aspect of the upper lobe extending even down into the lingular portion of the lobe as well as the apex was complex of blebs and bullae.  A 2nd port incision was made anterior to the first, and a stapler was used to remove all of  these blebs and bullae.  The Covidien stapler was used using both purple and black cartridges with pericardial reinforcement.  The anterior blebs were removed first.  Then, the apical blebs were removed.  Multiple times when the stapler was applied the lung adjacent to the staple line tore.  Once the blebs had been removed, a pleural tent was created with the portion of the pleura that was not already adherent to the posterolateral aspect of the upper lobe.  In areas where there were  superficial tears of the visceral pleura, Progel and BioGlue were applied.  Near the hilum, there was a tear that was deeper and had bleeding. 2-0 Ethibond horizontal mattress sutures with pledgets were placed, which controlled the bleeding, and then  BioGlue was applied over this area as well.  The chest was copiously irrigated with warm saline.  A 28-French chest tube was placed through the anterior port incision and a 28-French Blake drain was placed through the posterior port incision.  Both were  directed to the apex anteriorly and posteriorly, respectively.  Both were secured to the skin with #1 silk sutures.   Dual-lung ventilation was resumed.  There was no ongoing bleeding.  The working incision was closed in 3 layers.  Dermabond was applied.  The chest tubes were placed to suction.  The pigtail catheter that was left in place was also  placed to suction.  The patient was placed back in a supine position.  The double-lumen endotracheal tube was switched out for a single-lumen tube, and the patient then was transported to the surgical ICU in critical but stable condition.  All sponge,  needle and instrument counts were correct at the end of the procedure.  LN/NUANCE  D:12/28/2018 T:12/13/2018 JOB:[id number]

## 2018-12-25 NOTE — Anesthesia Procedure Notes (Signed)
Procedure Name: Intubation Date/Time: 12/22/2018 2:10 PM Performed by: Julian Reil, CRNA Pre-anesthesia Checklist: Patient identified, Emergency Drugs available, Suction available and Patient being monitored Patient Re-evaluated:Patient Re-evaluated prior to induction Oxygen Delivery Method: Circle system utilized Preoxygenation: Pre-oxygenation with 100% oxygen Laryngoscope Size: Glidescope and 4 Grade View: Grade I Tube type: Oral Tube size: 8.5 mm Placement Confirmation: ETT inserted through vocal cords under direct vision,  positive ETCO2 and breath sounds checked- equal and bilateral Secured at: 24 cm Tube secured with: Tape Dental Injury: Teeth and Oropharynx as per pre-operative assessment

## 2018-12-25 NOTE — Anesthesia Procedure Notes (Signed)
Procedure Name: Intubation Date/Time: 12/06/2018 11:07 AM Performed by: Julian Reil, CRNA Pre-anesthesia Checklist: Patient identified, Emergency Drugs available, Suction available and Patient being monitored Patient Re-evaluated:Patient Re-evaluated prior to induction Oxygen Delivery Method: Circle system utilized Preoxygenation: Pre-oxygenation with 100% oxygen Laryngoscope Size: Glidescope and 4 Grade View: Grade I Tube type: Oral Endobronchial tube: Left, Double lumen EBT and EBT position confirmed by fiberoptic bronchoscope and 39 Fr Number of attempts: 1 Intubation method: Cook Airway Exchange Catheter. Placement Confirmation: ETT inserted through vocal cords under direct vision,  positive ETCO2 and breath sounds checked- equal and bilateral Tube secured with: Tape Dental Injury: Teeth and Oropharynx as per pre-operative assessment

## 2018-12-25 NOTE — Transfer of Care (Signed)
Immediate Anesthesia Transfer of Care Note  Patient: Matthew Cuevas  Procedure(s) Performed: VIDEO ASSISTED THORACOSCOPY, Stapling of Blebs (Left Chest)  Patient Location: ICU  Anesthesia Type:General  Level of Consciousness: sedated, unresponsive and Patient remains intubated per anesthesia plan  Airway & Oxygen Therapy: Patient remains intubated per anesthesia plan and Patient placed on Ventilator (see vital sign flow sheet for setting)  Post-op Assessment: Report given to RN and Post -op Vital signs reviewed and stable  Post vital signs: Reviewed and stable  Last Vitals:  Vitals Value Taken Time  BP    Temp    Pulse    Resp    SpO2      Last Pain:  Vitals:   01/03/2019 0805  TempSrc: Oral  PainSc:       Patients Stated Pain Goal: 3 (12/12/18 0848)  Complications: No apparent anesthesia complications

## 2018-12-25 NOTE — Progress Notes (Signed)
CT Surgery  Back from OR R VATS Mod airleak with min fluid drainage Keep sedated  on vent tonite

## 2018-12-26 ENCOUNTER — Inpatient Hospital Stay (HOSPITAL_COMMUNITY): Payer: Medicare Other

## 2018-12-26 LAB — BPAM RBC
Blood Product Expiration Date: 202004092359
ISSUE DATE / TIME: 202003201305
Unit Type and Rh: 6200

## 2018-12-26 LAB — CBC WITH DIFFERENTIAL/PLATELET
Abs Immature Granulocytes: 0.14 10*3/uL — ABNORMAL HIGH (ref 0.00–0.07)
Abs Immature Granulocytes: 0.18 10*3/uL — ABNORMAL HIGH (ref 0.00–0.07)
BASOS PCT: 0 %
Basophils Absolute: 0.1 10*3/uL (ref 0.0–0.1)
Basophils Absolute: 0 10*3/uL (ref 0.0–0.1)
Basophils Relative: 0 %
Eosinophils Absolute: 0 10*3/uL (ref 0.0–0.5)
Eosinophils Absolute: 0 10*3/uL (ref 0.0–0.5)
Eosinophils Relative: 0 %
Eosinophils Relative: 0 %
HCT: 32 % — ABNORMAL LOW (ref 39.0–52.0)
HCT: 31.4 % — ABNORMAL LOW (ref 39.0–52.0)
Hemoglobin: 9.2 g/dL — ABNORMAL LOW (ref 13.0–17.0)
Hemoglobin: 8.9 g/dL — ABNORMAL LOW (ref 13.0–17.0)
Immature Granulocytes: 1 %
Immature Granulocytes: 1 %
LYMPHS ABS: 0.7 10*3/uL (ref 0.7–4.0)
Lymphocytes Relative: 3 %
Lymphocytes Relative: 4 %
Lymphs Abs: 0.9 10*3/uL (ref 0.7–4.0)
MCH: 28.9 pg (ref 26.0–34.0)
MCH: 28.7 pg (ref 26.0–34.0)
MCHC: 28.8 g/dL — ABNORMAL LOW (ref 30.0–36.0)
MCHC: 28.3 g/dL — ABNORMAL LOW (ref 30.0–36.0)
MCV: 100.6 fL — ABNORMAL HIGH (ref 80.0–100.0)
MCV: 101.3 fL — ABNORMAL HIGH (ref 80.0–100.0)
MONOS PCT: 12 %
Monocytes Absolute: 2.5 10*3/uL — ABNORMAL HIGH (ref 0.1–1.0)
Monocytes Absolute: 2.1 10*3/uL — ABNORMAL HIGH (ref 0.1–1.0)
Monocytes Relative: 10 %
NEUTROS PCT: 85 %
Neutro Abs: 17.9 10*3/uL — ABNORMAL HIGH (ref 1.7–7.7)
Neutro Abs: 18.2 10*3/uL — ABNORMAL HIGH (ref 1.7–7.7)
Neutrophils Relative %: 84 %
Platelets: 279 10*3/uL (ref 150–400)
Platelets: 292 10*3/uL (ref 150–400)
RBC: 3.18 MIL/uL — ABNORMAL LOW (ref 4.22–5.81)
RBC: 3.1 MIL/uL — ABNORMAL LOW (ref 4.22–5.81)
RDW: 19.2 % — ABNORMAL HIGH (ref 11.5–15.5)
RDW: 19.2 % — ABNORMAL HIGH (ref 11.5–15.5)
WBC Morphology: INCREASED
WBC: 21.3 10*3/uL — ABNORMAL HIGH (ref 4.0–10.5)
WBC: 21.4 10*3/uL — ABNORMAL HIGH (ref 4.0–10.5)
nRBC: 0 % (ref 0.0–0.2)
nRBC: 0 % (ref 0.0–0.2)

## 2018-12-26 LAB — POCT I-STAT 7, (LYTES, BLD GAS, ICA,H+H)
Acid-base deficit: 3 mmol/L — ABNORMAL HIGH (ref 0.0–2.0)
Acid-base deficit: 4 mmol/L — ABNORMAL HIGH (ref 0.0–2.0)
Bicarbonate: 23.2 mmol/L (ref 20.0–28.0)
Bicarbonate: 24.4 mmol/L (ref 20.0–28.0)
CALCIUM ION: 1.16 mmol/L (ref 1.15–1.40)
Calcium, Ion: 1.19 mmol/L (ref 1.15–1.40)
HCT: 25 % — ABNORMAL LOW (ref 39.0–52.0)
HCT: 28 % — ABNORMAL LOW (ref 39.0–52.0)
Hemoglobin: 8.5 g/dL — ABNORMAL LOW (ref 13.0–17.0)
Hemoglobin: 9.5 g/dL — ABNORMAL LOW (ref 13.0–17.0)
O2 Saturation: 100 %
O2 Saturation: 87 %
Patient temperature: 97.5
Patient temperature: 98.1
Potassium: 4.7 mmol/L (ref 3.5–5.1)
Potassium: 4.7 mmol/L (ref 3.5–5.1)
Sodium: 146 mmol/L — ABNORMAL HIGH (ref 135–145)
Sodium: 146 mmol/L — ABNORMAL HIGH (ref 135–145)
TCO2: 25 mmol/L (ref 22–32)
TCO2: 26 mmol/L (ref 22–32)
pCO2 arterial: 52 mmHg — ABNORMAL HIGH (ref 32.0–48.0)
pCO2 arterial: 54 mmHg — ABNORMAL HIGH (ref 32.0–48.0)
pH, Arterial: 7.238 — ABNORMAL LOW (ref 7.350–7.450)
pH, Arterial: 7.279 — ABNORMAL LOW (ref 7.350–7.450)
pO2, Arterial: 197 mmHg — ABNORMAL HIGH (ref 83.0–108.0)
pO2, Arterial: 61 mmHg — ABNORMAL LOW (ref 83.0–108.0)

## 2018-12-26 LAB — BASIC METABOLIC PANEL
Anion gap: 8 (ref 5–15)
Anion gap: 7 (ref 5–15)
BUN: 39 mg/dL — ABNORMAL HIGH (ref 8–23)
BUN: 48 mg/dL — ABNORMAL HIGH (ref 8–23)
CHLORIDE: 115 mmol/L — AB (ref 98–111)
CO2: 23 mmol/L (ref 22–32)
CO2: 21 mmol/L — ABNORMAL LOW (ref 22–32)
Calcium: 7.8 mg/dL — ABNORMAL LOW (ref 8.9–10.3)
Calcium: 7.9 mg/dL — ABNORMAL LOW (ref 8.9–10.3)
Chloride: 117 mmol/L — ABNORMAL HIGH (ref 98–111)
Creatinine, Ser: 1.54 mg/dL — ABNORMAL HIGH (ref 0.61–1.24)
Creatinine, Ser: 1.93 mg/dL — ABNORMAL HIGH (ref 0.61–1.24)
GFR calc Af Amer: 50 mL/min — ABNORMAL LOW (ref >60)
GFR calc Af Amer: 38 mL/min — ABNORMAL LOW (ref >60)
GFR calc non Af Amer: 43 mL/min — ABNORMAL LOW (ref >60)
GFR, EST NON AFRICAN AMERICAN: 33 mL/min — AB (ref >60)
Glucose, Bld: 144 mg/dL — ABNORMAL HIGH (ref 70–99)
Glucose, Bld: 148 mg/dL — ABNORMAL HIGH (ref 70–99)
Potassium: 4.8 mmol/L (ref 3.5–5.1)
Potassium: 4.2 mmol/L (ref 3.5–5.1)
Sodium: 146 mmol/L — ABNORMAL HIGH (ref 135–145)
Sodium: 145 mmol/L (ref 135–145)

## 2018-12-26 LAB — CBC
HCT: 31.7 % — ABNORMAL LOW (ref 39.0–52.0)
Hemoglobin: 9.3 g/dL — ABNORMAL LOW (ref 13.0–17.0)
MCH: 29.2 pg (ref 26.0–34.0)
MCHC: 29.3 g/dL — ABNORMAL LOW (ref 30.0–36.0)
MCV: 99.4 fL (ref 80.0–100.0)
Platelets: 332 10*3/uL (ref 150–400)
RBC: 3.19 MIL/uL — ABNORMAL LOW (ref 4.22–5.81)
RDW: 18.4 % — ABNORMAL HIGH (ref 11.5–15.5)
WBC: 20.3 10*3/uL — ABNORMAL HIGH (ref 4.0–10.5)
nRBC: 0.1 % (ref 0.0–0.2)

## 2018-12-26 LAB — TYPE AND SCREEN
ABO/RH(D): A POS
Antibody Screen: NEGATIVE
Unit division: 0

## 2018-12-26 LAB — GLUCOSE, CAPILLARY
Glucose-Capillary: 118 mg/dL — ABNORMAL HIGH (ref 70–99)
Glucose-Capillary: 118 mg/dL — ABNORMAL HIGH (ref 70–99)
Glucose-Capillary: 120 mg/dL — ABNORMAL HIGH (ref 70–99)
Glucose-Capillary: 122 mg/dL — ABNORMAL HIGH (ref 70–99)
Glucose-Capillary: 133 mg/dL — ABNORMAL HIGH (ref 70–99)
Glucose-Capillary: 137 mg/dL — ABNORMAL HIGH (ref 70–99)
Glucose-Capillary: 150 mg/dL — ABNORMAL HIGH (ref 70–99)
Glucose-Capillary: 98 mg/dL (ref 70–99)

## 2018-12-26 LAB — PHOSPHORUS: Phosphorus: 4.4 mg/dL (ref 2.5–4.6)

## 2018-12-26 LAB — MAGNESIUM: MAGNESIUM: 2 mg/dL (ref 1.7–2.4)

## 2018-12-26 MED ORDER — AMIODARONE LOAD VIA INFUSION
150.0000 mg | Freq: Once | INTRAVENOUS | Status: AC
  Administered 2018-12-26: 150 mg via INTRAVENOUS
  Filled 2018-12-26: qty 83.34

## 2018-12-26 MED ORDER — IPRATROPIUM BROMIDE 0.02 % IN SOLN
0.5000 mg | Freq: Three times a day (TID) | RESPIRATORY_TRACT | Status: DC
  Administered 2018-12-26 (×2): 0.5 mg via RESPIRATORY_TRACT
  Filled 2018-12-26 (×2): qty 2.5

## 2018-12-26 MED ORDER — ALBUMIN HUMAN 25 % IV SOLN
25.0000 g | Freq: Once | INTRAVENOUS | Status: AC
  Administered 2018-12-26: 25 g via INTRAVENOUS
  Filled 2018-12-26: qty 50

## 2018-12-26 MED ORDER — LEVALBUTEROL HCL 0.63 MG/3ML IN NEBU
0.6300 mg | INHALATION_SOLUTION | Freq: Three times a day (TID) | RESPIRATORY_TRACT | Status: DC
  Administered 2018-12-26 – 2018-12-28 (×9): 0.63 mg via RESPIRATORY_TRACT
  Filled 2018-12-26 (×10): qty 3

## 2018-12-26 MED ORDER — FENTANYL BOLUS VIA INFUSION
50.0000 ug | INTRAVENOUS | Status: DC | PRN
  Administered 2018-12-26 – 2018-12-29 (×6): 50 ug via INTRAVENOUS
  Filled 2018-12-26: qty 50

## 2018-12-26 MED ORDER — LEVALBUTEROL HCL 0.63 MG/3ML IN NEBU: 0.6300 mg | INHALATION_SOLUTION | Freq: Three times a day (TID) | RESPIRATORY_TRACT | Status: DC

## 2018-12-26 MED ORDER — PHENYLEPHRINE HCL-NACL 40-0.9 MG/250ML-% IV SOLN
0.0000 ug/min | INTRAVENOUS | Status: DC
  Administered 2018-12-26: 150 ug/min via INTRAVENOUS
  Administered 2018-12-26: 240 ug/min via INTRAVENOUS
  Administered 2018-12-26: 220 ug/min via INTRAVENOUS
  Administered 2018-12-26: 200 ug/min via INTRAVENOUS
  Administered 2018-12-26 (×2): 260 ug/min via INTRAVENOUS
  Administered 2018-12-26: 230 ug/min via INTRAVENOUS
  Administered 2018-12-27 (×2): 320 ug/min via INTRAVENOUS
  Administered 2018-12-27: 250 ug/min via INTRAVENOUS
  Administered 2018-12-27: 320 ug/min via INTRAVENOUS
  Administered 2018-12-27: 240 ug/min via INTRAVENOUS
  Administered 2018-12-27: 320 ug/min via INTRAVENOUS
  Administered 2018-12-27: 190 ug/min via INTRAVENOUS
  Administered 2018-12-28: 375 ug/min via INTRAVENOUS
  Administered 2018-12-28: 170 ug/min via INTRAVENOUS
  Administered 2018-12-28: 240 ug/min via INTRAVENOUS
  Administered 2018-12-28: 275 ug/min via INTRAVENOUS
  Administered 2018-12-28: 210 ug/min via INTRAVENOUS
  Administered 2018-12-28: 270 ug/min via INTRAVENOUS
  Administered 2018-12-29: 100 ug/min via INTRAVENOUS
  Filled 2018-12-26 (×27): qty 250

## 2018-12-26 MED ORDER — PHENYLEPHRINE HCL-NACL 10-0.9 MG/250ML-% IV SOLN
INTRAVENOUS | Status: AC
  Filled 2018-12-26: qty 250

## 2018-12-26 MED ORDER — AMIODARONE HCL IN DEXTROSE 360-4.14 MG/200ML-% IV SOLN
60.0000 mg/h | INTRAVENOUS | Status: AC
  Administered 2018-12-26 – 2018-12-27 (×2): 60 mg/h via INTRAVENOUS
  Filled 2018-12-26 (×2): qty 200

## 2018-12-26 MED ORDER — IPRATROPIUM BROMIDE 0.02 % IN SOLN
0.5000 mg | Freq: Three times a day (TID) | RESPIRATORY_TRACT | Status: DC
  Administered 2018-12-27 – 2018-12-28 (×6): 0.5 mg via RESPIRATORY_TRACT
  Filled 2018-12-26 (×7): qty 2.5

## 2018-12-26 MED ORDER — AMIODARONE HCL IN DEXTROSE 360-4.14 MG/200ML-% IV SOLN
30.0000 mg/h | INTRAVENOUS | Status: DC
  Administered 2018-12-27 – 2018-12-29 (×5): 30 mg/h via INTRAVENOUS
  Filled 2018-12-26 (×7): qty 200

## 2018-12-26 MED ORDER — FUROSEMIDE 10 MG/ML IJ SOLN
4.0000 mg/h | INTRAVENOUS | Status: AC
  Administered 2018-12-26: 4 mg/h via INTRAVENOUS
  Filled 2018-12-26: qty 20

## 2018-12-26 MED ORDER — ALBUMIN HUMAN 5 % IV SOLN
25.0000 g | Freq: Once | INTRAVENOUS | Status: DC
  Filled 2018-12-26: qty 250

## 2018-12-26 MED ORDER — ALBUMIN HUMAN 5 % IV SOLN
12.5000 g | Freq: Once | INTRAVENOUS | Status: AC
  Administered 2018-12-26: 12.5 g via INTRAVENOUS
  Filled 2018-12-26: qty 250

## 2018-12-26 NOTE — Progress Notes (Signed)
  Amiodarone Drug - Drug Interaction Consult Note  Recommendations: -Watch QTc closely with Seroquel (last QTc 407 on 3/19)   Amiodarone is metabolized by the cytochrome P450 system and therefore has the potential to cause many drug interactions. Amiodarone has an average plasma half-life of 50 days (range 20 to 100 days).   There is potential for drug interactions to occur several weeks or months after stopping treatment and the onset of drug interactions may be slow after initiating amiodarone.   []  Statins: Increased risk of myopathy. Simvastatin- restrict dose to 20mg  daily. Other statins: counsel patients to report any muscle pain or weakness immediately.  []  Anticoagulants: Amiodarone can increase anticoagulant effect. Consider warfarin dose reduction. Patients should be monitored closely and the dose of anticoagulant altered accordingly, remembering that amiodarone levels take several weeks to stabilize.  []  Antiepileptics: Amiodarone can increase plasma concentration of phenytoin, the dose should be reduced. Note that small changes in phenytoin dose can result in large changes in levels. Monitor patient and counsel on signs of toxicity.  []  Beta blockers: increased risk of bradycardia, AV block and myocardial depression. Sotalol - avoid concomitant use.  []   Calcium channel blockers (diltiazem and verapamil): increased risk of bradycardia, AV block and myocardial depression.  []   Cyclosporine: Amiodarone increases levels of cyclosporine. Reduced dose of cyclosporine is recommended.  []  Digoxin dose should be halved when amiodarone is started.  []  Diuretics: increased risk of cardiotoxicity if hypokalemia occurs.  []  Oral hypoglycemic agents (glyburide, glipizide, glimepiride): increased risk of hypoglycemia. Patient's glucose levels should be monitored closely when initiating amiodarone therapy.   [x]  Drugs that prolong the QT interval:  Torsades de pointes risk may be increased  with concurrent use - avoid if possible.  Monitor QTc, also keep magnesium/potassium WNL if concurrent therapy can't be avoided. Marland Kitchen Antibiotics: e.g. fluoroquinolones, erythromycin. . Antiarrhythmics: e.g. quinidine, procainamide, disopyramide, sotalol. . Antipsychotics: e.g. phenothiazines, haloperidol.  . Lithium, tricyclic antidepressants, and methadone. Thank Clyda Greener  12/26/2018 8:48 PM

## 2018-12-26 NOTE — Progress Notes (Signed)
   CT surgery p.m. Rounds  Patient with persistent significant serosanguineous drainage from chest tubes but hemoglobin remained stable at 9.9 Blood pressure soft with heavy sedation and increasing doses of Neo-Synephrine-1 unit albumin administered Continue chest tubes to suction

## 2018-12-26 NOTE — Progress Notes (Signed)
During bathing pt became extremely agitated, dysnchronous with vent. Fentanyl titrated, pt remained tachycardic, tachypneic and agitated. Versed given as ordered. Neo titrated to 300 to maintain pressure.

## 2018-12-26 NOTE — Progress Notes (Signed)
0045: Elink notified of low urine output and respiratory status. Pt using accessory muscles and agonal breathing in spite of being on the ventilator. ABG recommended and obtained. Results reported to elink. RT notified and FIo2 increased to 70%. Elink ordered ABG and CXR for AM.   0115: Dr Donata Clay notified of ABG results and Chest tube output of 550 since shift change. Stat CBC ordered and if hgb < 7 to transfuse one unit prbc. ABG results for CCM to manage. 0200: Elink notified of decreasing urine output and to see if Dr wanted rate on ventilator to be increased. Elink RN recommended sedation to be increased. No other new orders received.

## 2018-12-26 NOTE — Progress Notes (Signed)
Progress Note  Patient Name: Matthew Cuevas Date of Encounter: 12/26/2018  Primary Cardiologist: Quay Burow, MD   Subjective   Still intubated, nurses and wife at bedside. Inpatient Medications    Scheduled Meds: . sodium chloride   Intravenous Once  . arformoterol  15 mcg Nebulization BID  . aspirin  81 mg Per Tube Daily  . budesonide (PULMICORT) nebulizer solution  0.5 mg Nebulization BID  . chlorhexidine gluconate (MEDLINE KIT)  15 mL Mouth Rinse BID  . Chlorhexidine Gluconate Cloth  6 each Topical Daily  . enoxaparin (LOVENOX) injection  40 mg Subcutaneous Daily  . famotidine  20 mg Per Tube BID  . feeding supplement (PRO-STAT SUGAR FREE 64)  30 mL Per Tube BID  . insulin aspart  2-6 Units Subcutaneous Q4H  . levalbuterol  0.63 mg Nebulization TID  . mouth rinse  15 mL Mouth Rinse 10 times per day  . montelukast  10 mg Per Tube QHS  . polyethylene glycol  17 g Oral Daily  . QUEtiapine  25 mg Oral QHS  . sennosides  5 mL Per Tube BID  . sodium chloride flush  10-40 mL Intracatheter Q12H  . sterile water (preservative free)       Continuous Infusions: . sodium chloride    . sodium chloride 10 mL/hr at 12/16/2018 1000  . feeding supplement (VITAL AF 1.2 CAL) 50 mL/hr at 12/26/18 0600  . fentaNYL infusion INTRAVENOUS 150 mcg/hr (12/26/18 0828)  . phenylephrine (NEO-SYNEPHRINE) Adult infusion 260 mcg/min (12/26/18 0921)   PRN Meds: Place/Maintain arterial line **AND** sodium chloride, sodium chloride, fentaNYL (SUBLIMAZE) injection, levalbuterol, LORazepam, metoprolol tartrate, midazolam, ondansetron **OR** [DISCONTINUED] ondansetron (ZOFRAN) IV, sodium chloride flush   Vital Signs    Vitals:   12/26/18 0808 12/26/18 0811 12/26/18 0814 12/26/18 0828  BP: (!) 103/37     Pulse: 71     Resp: 16     Temp:    97.6 F (36.4 C)  TempSrc:    Axillary  SpO2: 100% 100% 100%   Weight:      Height:        Intake/Output Summary (Last 24 hours) at 12/26/2018 1010  Last data filed at 12/26/2018 0034 Gross per 24 hour  Intake 4293.17 ml  Output 2560 ml  Net 1733.17 ml   Last 3 Weights 12/30/2018 12/24/2018 12/23/2018  Weight (lbs) 179 lb 3.7 oz 178 lb 2.1 oz 171 lb 11.8 oz  Weight (kg) 81.3 kg 80.8 kg 77.9 kg      Telemetry    Sinus rhythm, no evidence of atrial fibrillation or flutter or PSVT- Personally Reviewed  ECG    March 19 atrial flutter noted- Personally Reviewed  Physical Exam   GEN:  Ill-appearing on ventilator Neck:  Central access noted Cardiac:  Sinus rhythm on telemetry Respiratory:  Rise and fall of chest noted, on ventilator. GI:  non-distended  MS:  No deformity. Neuro:  Nonfocal  Psych: Normal affect   Labs    Chemistry Recent Labs  Lab 12/20/18 0538  12/23/18 0247  0353 12/20/2018 1253 12/26/18 0041 12/26/18 0439 12/26/18 0621  NA 145   < > 149* 147* 148* 146* 146* 146*  K 3.6   < > 3.9 4.2 4.0 4.7 4.8 4.7  CL 115*   < > 117* 115*  --   --  115*  --   CO2 22   < > 26 27  --   --  23  --   GLUCOSE  131*   < > 98 93 133*  --  144*  --   BUN 36*   < > 38* 34*  --   --  39*  --   CREATININE 1.10   < > 1.00 0.98  --   --  1.54*  --   CALCIUM 8.4*   < > 8.5* 8.1*  --   --  7.8*  --   PROT 5.6*  --   --   --   --   --   --   --   ALBUMIN 2.6*  --   --   --   --   --   --   --   AST 18  --   --   --   --   --   --   --   ALT 22  --   --   --   --   --   --   --   ALKPHOS 80  --   --   --   --   --   --   --   BILITOT 0.7  --   --   --   --   --   --   --   GFRNONAA >60   < > >60 >60  --   --  43*  --   GFRAA >60   < > >60 >60  --   --  50*  --   ANIONGAP 8   < > 6 5  --   --  8  --    < > = values in this interval not displayed.     Hematology Recent Labs  Lab 12/15/2018 0353  12/26/18 0130 12/26/18 0439 12/26/18 0621  WBC 13.4*  --  21.3* 21.4*  --   RBC 2.76*  --  3.18* 3.10*  --   HGB 8.5*   < > 9.2* 8.9* 8.5*  HCT 29.2*   < > 32.0* 31.4* 25.0*  MCV 105.8*  --  100.6* 101.3*  --   MCH 30.8   --  28.9 28.7  --   MCHC 29.1*  --  28.8* 28.3*  --   RDW 14.0  --  19.2* 19.2*  --   PLT 312  --  279 292  --    < > = values in this interval not displayed.    Cardiac EnzymesNo results for input(s): TROPONINI in the last 168 hours. No results for input(s): TROPIPOC in the last 168 hours.   BNPNo results for input(s): BNP, PROBNP in the last 168 hours.   DDimer No results for input(s): DDIMER in the last 168 hours.   Radiology    Dg Chest Port 1 View  Result Date: 12/26/2018 CLINICAL DATA:  Respiratory failure. EXAM: PORTABLE CHEST 1 VIEW COMPARISON:  12/21/2018 FINDINGS: Nasogastric tube courses into the region of the stomach as tip is not visualized. Endotracheal tube, left I would J central venous catheter and 3 left-sided chest tubes unchanged. Stable volume loss with hazy opacification throughout the left lung. Surgical sutures over the left mid and upper thorax. Hazy opacification over the right base unchanged. Remainder of the exam is unchanged. IMPRESSION: Stable postsurgical change with volume loss of the left lung with stable hazy opacification throughout the left lung. Stable hazy opacification over the right base. Tubes and lines as described. Electronically Signed   By: Daniel  Boyle M.D.   On: 12/26/2018 08:40   Dg Chest   Port 1 View  Result Date: 01/04/2019 CLINICAL DATA:  Spontaneous pneumothorax EXAM: PORTABLE CHEST 1 VIEW COMPARISON:  12/24/2018 FINDINGS: Endotracheal tube is unchanged, approximately 0.8 centimeters above the carina. Nasogastric tube is identified, tip overlying the level of the stomach. Interval placement of 2 LEFT chest tubes. Pigtail catheter remains in place. There are new sutures in the LEFT lung apex. Suspect this small LEFT apical pneumothorax. There is increased opacity throughout the LEFT lung. RIGHT lung is hyperexpanded with focal opacity at the base. IMPRESSION: 1. Interval placement of 2 LEFT chest tubes. 2. Suspect small LEFT apical pneumothorax.  3. Increased opacity throughout the LEFT lung. Electronically Signed   By: Elizabeth  Brown M.D.   On: 01/03/2019 17:09   Dg Chest Port 1 View  Result Date: 12/24/2018 CLINICAL DATA:  Follow-up pneumothorax EXAM: PORTABLE CHEST 1 VIEW COMPARISON:  12/23/2018 FINDINGS: Cardiac shadow is stable. Endotracheal tube and gastric catheter are again seen and stable as is a left jugular central line. Left pigtail chest tube is noted. No significant pneumothorax is noted at this time. Diffuse infiltrate is again seen throughout the left lung stable from the previous exam. No new focal infiltrate is seen. Diffuse emphysematous changes are noted. Multiple bronchial bowels are noted on the left. IMPRESSION: Tubes and lines as described above. No sizable pneumothorax is seen. Stable left-sided infiltrate. Electronically Signed   By:   Lukens M.D.   On: 12/24/2018 12:05   Dg Abd Portable 1v  Result Date: 12/19/2018 CLINICAL DATA:  OG tube placement. EXAM: PORTABLE ABDOMEN - 1 VIEW COMPARISON:  Single-view of the abdomen 12/19/2018. FINDINGS: OG tube tip and side-port in the stomach. IMPRESSION: As above. Electronically Signed   By: Thomas  Dalessio M.D.   On: 12/16/2018 16:01    Cardiac Studies  Echocardiogram 12/21/2018  1. The left ventricle has hyperdynamic systolic function, with an ejection fraction of >65%. The cavity size was normal. Left ventricular diastolic function could not be evaluated.  2. The right ventricle has normal systolic function. The cavity was normal. There is no increase in right ventricular wall thickness.  3. Small pericardial effusion.  4. The pericardial effusion is localized near the right ventricle and surrounding the apex.  Patient Profile     77 y.o. male with pneumothorax, prolonged air leak left-sided with prior episodes of PSVT paroxysmal atrial fibrillation/flutter, with underlying hypoxic respiratory failure  Assessment & Plan    PSVT/paroxysmal atrial  fibrillation/flutter - Reviewed telemetry.  No further recurrences of arrhythmias noted.  Currently sinus rhythm. - If atrial fibrillation were to return, could consider long-term anticoagulation, however at this point given chest tubes and recent surgery would hold off.  Risk of stroke is increased with return of atrial fibrillation.  Hypotension - Unable to utilize beta-blocker or calcium channel blocker to help suppress arrhythmia.  Currently on phenylephrine.  No further cardiac issues at this time.  We will sign off.  Spoke to wife.  If further concerns arise please do not hesitate to call.  [facility] will sign off.    For questions or updates, please contact [facility] Please consult [email or web address] for contact info under        Signed,  , MD  12/26/2018, 10:10 AM    

## 2018-12-26 NOTE — Progress Notes (Signed)
1 Day Post-Op Procedure(s) (LRB): VIDEO ASSISTED THORACOSCOPY, Stapling of Blebs (Left) Subjective: Air leak and serosanguinous drainage from posterior chest tube Hb stable Cont low dose lovenox Now on .50 Fio2 Objective: Vital signs in last 24 hours: Temp:  [97.4 F (36.3 C)-98.1 F (36.7 C)] 97.6 F (36.4 C) (03/21 0828) Pulse Rate:  [71-98] 85 (03/21 1000) Cardiac Rhythm: Normal sinus rhythm (03/20 1946) Resp:  [8-28] 17 (03/21 1000) BP: (81-104)/(30-83) 103/37 (03/21 0808) SpO2:  [93 %-100 %] 99 % (03/21 1000) Arterial Line BP: (92-140)/(36-55) 126/40 (03/21 1000) FiO2 (%):  [40 %-70 %] 50 % (03/21 0814)  Hemodynamic parameters for last 24 hours:  stable  Intake/Output from previous day: 03/20 0701 - 03/21 0700 In: 4371.3 [I.V.:2806.3; Blood:315; NG/GT:700; IV Piggyback:550] Out: 2560 [Urine:775; Blood:500; Chest Tube:1285] Intake/Output this shift: Total I/O In: 704.8 [I.V.:414.8; NG/GT:240; IV Piggyback:50] Out: 250 [Chest Tube:250]  Sedated on vent Coarse breath soundsL>R  Lab Results: Recent Labs    12/26/18 0130 12/26/18 0439 12/26/18 0621  WBC 21.3* 21.4*  --   HGB 9.2* 8.9* 8.5*  HCT 32.0* 31.4* 25.0*  PLT 279 292  --    BMET:  Recent Labs    01/04/2019 0353 12/09/2018 1253  12/26/18 0439 12/26/18 0621  NA 147* 148*   < > 146* 146*  K 4.2 4.0   < > 4.8 4.7  CL 115*  --   --  115*  --   CO2 27  --   --  23  --   GLUCOSE 93 133*  --  144*  --   BUN 34*  --   --  39*  --   CREATININE 0.98  --   --  1.54*  --   CALCIUM 8.1*  --   --  7.8*  --    < > = values in this interval not displayed.    PT/INR: No results for input(s): LABPROT, INR in the last 72 hours. ABG    Component Value Date/Time   PHART 7.279 (L) 12/26/2018 0621   HCO3 24.4 12/26/2018 0621   TCO2 26 12/26/2018 0621   ACIDBASEDEF 3.0 (H) 12/26/2018 0621   O2SAT 100.0 12/26/2018 0621   CBG (last 3)  Recent Labs    12/26/18 0000 12/26/18 0449 12/26/18 0827  GLUCAP 118* 118*  150*    Assessment/Plan: S/P Procedure(s) (LRB): VIDEO ASSISTED THORACOSCOPY, Stapling of Blebs (Left) Cont current care Transfuse for Hb < 7   LOS: 17 days    Kathlee Nations Trigt III 12/26/2018

## 2018-12-26 NOTE — Progress Notes (Addendum)
NAME:  Matthew Cuevas, MRN:  [id number], DOB:  [birth year], LOS: 17 ADMISSION DATE:  01/02/2019, CONSULTATION DATE:  12/18/2018 REFERRING MD:  Jonathon Bellows, CHIEF COMPLAINT:  Shortness of breath  Brief History   78 y/o M initially admitted 2/23 to [facility] with bleb rupture / spontaneous pneumothorax. He had multiple tubes placed there with doxycycline pleurodesis but he had an adverse reaction and transferred to [facility] for further evaluation.  Known COPD with a BPF s/p bleb rupture 2/23. Failed chemical pleurodesis and EBV. On 3/13 he was planned to undergo VATS with stapling of blebs but suffered a cardiac arrest with induction requiring CPR / subsequent rib fractures.  Procedure aborted.  Course complicated by staph PNA and persistent air leak.   Past Medical History  COPD  HTN   Tobacco abuse  Significant Hospital Events   3/04 Admit  3/13 PEA arrest with induction, re-intubated 3/14 SVT during weaning attempt so aborted. Started on vanc for staph in sputum 3/20 L VATS with resection of blebs in LUL, pleural tent   Consults:    Procedures:  2/23  Admit to [facility]  3/04  Transfer to [facility]  3/13  Planned VATS, suffered cardiac arrest with induction, rib fractures.  Procedure aborted.  3/20  Returned to OR  Significant Diagnostic Tests:  CT Chest 3/5 >> large anterior left upper pneumothorax  Micro Data:  Sputum culture 3/13 >> MSSA Blood culture 3/6 >> negative Urine culture 3/6 >> 30k Enterococcus faecalis    Antimicrobials:  Cefepime 3/13 >> 3/16 Ancef 3/16 >> 2/21 Vancomycin 3/14 >> 3/16  Interim history/subjective:  RN reports pt continues on sedation, Neo at 200 mcg.  Sedation lightened, pt following commands. Wife at bedside - she volunteers that she wants him to have a "fighting chance" but he doesn't want to be on the vent forever.   Objective   Blood pressure (!) 103/37, pulse 85, temperature 97.6 F (36.4 C), temperature source Axillary, resp. rate  17, height 5\' 10"  (1.778 m), weight 81.3 kg, SpO2 99 %.    Vent Mode: PRVC FiO2 (%):  [40 %-70 %] 50 % Set Rate:  [16 bmp] 16 bmp Vt Set:  [580 mL] 580 mL PEEP:  [5 cmH20] 5 cmH20 Plateau Pressure:  [29 cmH20-35 cmH20] 29 cmH20   Intake/Output Summary (Last 24 hours) at 12/26/2018 1048 Last data filed at 12/26/2018 1000 Gross per 24 hour  Intake 4699.85 ml  Output 2810 ml  Net 1889.85 ml   Filed Weights   12/23/18 0200 12/24/18 0500 12/08/2018 0345  Weight: 77.9 kg 80.8 kg 81.3 kg   Physical Exam: General: elderly male lying in bed on vent HEENT: MM pink/moist, ETT, no jvd Neuro: sedate but awakens, gives thumbs up, generalized weakness  CV: s1s2 rrr, no m/r/g PULM: mild accessory muscle use, wheezing bilaterally, chest tube suction noise noted, anterior chest tube dressing c/d/i, lateral chest tube dressing c/d/i CM:KLKJ, non-tender, bsx4 active  Extremities: warm/dry, generalized 2-3+ pitting edema  Skin: no rashes or lesions   Resolved Hospital Problem list   PEA arrest (respiratory and anesthesia related 3/13)   Assessment & Plan:   Acute Hypoxic Respiratory Failure 2/2 AECOPD, MSSA PNA vs ATX and persistent left PTX w/ BPF. Rib Fractures from CPR.  -difficult to wean with hemodynamic instability (tachypnea, episodes SVT) during, BPF  P: PRVC 8cc /kg  Adjust rate to 18 based on am ABG Follow up CXR, ABG in am  Post operative, CT care per  CVTS  Continue brovana + pulmicort, xopenex nebs  Add atrovent Q8  Hypotension -sedation confounding factor  P: Wean Neo for MAP > 66 Minimize sedation as pt tolerates  ICU monitoring   AKI Volume Overload P: Trend BMP / urinary output Replace electrolytes as indicated Avoid nephrotoxic agents, ensure adequate renal perfusion Albumin 25g x1 with lasix gtt for next 24 hours, consider d/c 3/22 am pending review.  BMP 2200 to follow up with lasix gtt   SVT -recurrent with vent weaning efforts  P: Cardiology following,  appreciate input  Continue PRN lopressor  Wean pressors to off as able   Agitated Delirium  P: Minimize sedation as able  Seroquel QHS  Hyperglycemia P: SSI   GOC Defer further goals of care to Dr. Dorris Fetch.  Wife hopeful for recover but does not want him to remain on the vent long term. She is anxious over the mandatory no visitor policy upcoming.     Best practice:  Diet: enteral nutrition Pain/Anxiety/Delirium protocol (if indicated): fentanyl gtt, PRN versed  VAP protocol (if indicated): Continue  DVT prophylaxis: SCD GI prophylaxis: famotidine  Glucose control: SSI  Mobility: As tolerated  Code Status: Full Family Communication: Wife updated 3/21 at bedside.  Disposition: ICU    CC Time: 30 minutes   Canary Brim, NP-C [facility] Pgr: [phone] or if no answer [phone] 12/26/2018, 11:37 AM

## 2018-12-27 ENCOUNTER — Inpatient Hospital Stay (HOSPITAL_COMMUNITY): Payer: Medicare Other

## 2018-12-27 LAB — BASIC METABOLIC PANEL
Anion gap: 7 (ref 5–15)
BUN: 54 mg/dL — ABNORMAL HIGH (ref 8–23)
CO2: 23 mmol/L (ref 22–32)
Calcium: 7.9 mg/dL — ABNORMAL LOW (ref 8.9–10.3)
Chloride: 114 mmol/L — ABNORMAL HIGH (ref 98–111)
Creatinine, Ser: 2.11 mg/dL — ABNORMAL HIGH (ref 0.61–1.24)
GFR calc Af Amer: 34 mL/min — ABNORMAL LOW (ref >60)
GFR calc non Af Amer: 30 mL/min — ABNORMAL LOW (ref >60)
Glucose, Bld: 150 mg/dL — ABNORMAL HIGH (ref 70–99)
Potassium: 4.4 mmol/L (ref 3.5–5.1)
Sodium: 144 mmol/L (ref 135–145)

## 2018-12-27 LAB — GLUCOSE, CAPILLARY
Glucose-Capillary: 109 mg/dL — ABNORMAL HIGH (ref 70–99)
Glucose-Capillary: 109 mg/dL — ABNORMAL HIGH (ref 70–99)
Glucose-Capillary: 113 mg/dL — ABNORMAL HIGH (ref 70–99)
Glucose-Capillary: 123 mg/dL — ABNORMAL HIGH (ref 70–99)
Glucose-Capillary: 128 mg/dL — ABNORMAL HIGH (ref 70–99)
Glucose-Capillary: 133 mg/dL — ABNORMAL HIGH (ref 70–99)

## 2018-12-27 LAB — CBC
HCT: 30.3 % — ABNORMAL LOW (ref 39.0–52.0)
HCT: 29.1 % — ABNORMAL LOW (ref 39.0–52.0)
Hemoglobin: 8.8 g/dL — ABNORMAL LOW (ref 13.0–17.0)
Hemoglobin: 8.6 g/dL — ABNORMAL LOW (ref 13.0–17.0)
MCH: 29.1 pg (ref 26.0–34.0)
MCH: 29.4 pg (ref 26.0–34.0)
MCHC: 29 g/dL — ABNORMAL LOW (ref 30.0–36.0)
MCHC: 29.6 g/dL — ABNORMAL LOW (ref 30.0–36.0)
MCV: 100.3 fL — ABNORMAL HIGH (ref 80.0–100.0)
MCV: 99.3 fL (ref 80.0–100.0)
Platelets: 307 10*3/uL (ref 150–400)
Platelets: 287 10*3/uL (ref 150–400)
RBC: 3.02 MIL/uL — ABNORMAL LOW (ref 4.22–5.81)
RBC: 2.93 MIL/uL — ABNORMAL LOW (ref 4.22–5.81)
RDW: 18.2 % — ABNORMAL HIGH (ref 11.5–15.5)
RDW: 17.9 % — ABNORMAL HIGH (ref 11.5–15.5)
WBC: 21.3 10*3/uL — ABNORMAL HIGH (ref 4.0–10.5)
WBC: 17.3 10*3/uL — ABNORMAL HIGH (ref 4.0–10.5)
nRBC: 0.2 % (ref 0.0–0.2)
nRBC: 0.2 % (ref 0.0–0.2)

## 2018-12-27 LAB — PROCALCITONIN: Procalcitonin: 2.18 ng/mL

## 2018-12-27 LAB — CORTISOL: CORTISOL PLASMA: 15.9 ug/dL

## 2018-12-27 MED ORDER — SODIUM CHLORIDE 0.9 % IV SOLN
2.0000 g | INTRAVENOUS | Status: DC
  Administered 2018-12-27 – 2018-12-28 (×2): 2 g via INTRAVENOUS
  Filled 2018-12-27 (×3): qty 20

## 2018-12-27 MED ORDER — VANCOMYCIN HCL IN DEXTROSE 1-5 GM/200ML-% IV SOLN: 1000.0000 mg | INTRAVENOUS | Status: DC

## 2018-12-27 MED ORDER — VANCOMYCIN HCL 10 G IV SOLR
2000.0000 mg | Freq: Once | INTRAVENOUS | Status: AC
  Administered 2018-12-27: 2000 mg via INTRAVENOUS
  Filled 2018-12-27: qty 2000

## 2018-12-27 MED ORDER — ALBUMIN HUMAN 25 % IV SOLN
12.5000 g | Freq: Four times a day (QID) | INTRAVENOUS | Status: AC
  Administered 2018-12-27 – 2018-12-28 (×4): 12.5 g via INTRAVENOUS
  Filled 2018-12-27 (×4): qty 50

## 2018-12-27 NOTE — Progress Notes (Signed)
2 Days Post-Op Procedure(s) (LRB): VIDEO ASSISTED THORACOSCOPY, Stapling of Blebs (Left) Subjective: Sedated on vent 200 mcg fentanyl, 290 mcg neo 2 column chest tube air leak, 1 L serosanguinous output- Hb 8.8 Better urine output on lasix 4mg /hr afib rate 80-90 FiO2 down to .40 CXR with dense infiltrate L lung Wife wishes no CPR/shocks Objective: Vital signs in last 24 hours: Temp:  [97.8 F (36.6 C)-99.6 F (37.6 C)] 99.6 F (37.6 C) (03/22 0759) Pulse Rate:  [29-141] 86 (03/22 1000) Cardiac Rhythm: Atrial fibrillation (03/22 0800) Resp:  [16-30] 19 (03/22 1000) BP: (93-115)/(37-95) 106/52 (03/22 1000) SpO2:  [87 %-100 %] 97 % (03/22 1000) Arterial Line BP: (74-158)/(34-74) 77/74 (03/22 0300) FiO2 (%):  [40 %] 40 % (03/22 0815) Weight:  [87.5 kg] 87.5 kg (03/22 0500)  Hemodynamic parameters for last 24 hours: CVP:  [11 mmHg-15 mmHg] 15 mmHg  Intake/Output from previous day: 03/21 0701 - 03/22 0700 In: 3682.4 [I.V.:3171; NG/GT:240; IV Piggyback:271.4] Out: 2900 [Urine:1400; Chest Tube:1500] Intake/Output this shift: Total I/O In: 595 [I.V.:445; NG/GT:150] Out: 315 [Urine:235; Chest Tube:80]  EXAM Sedated Coarse breath sounds with  scattered wheezes Mild edema Lab Results: Recent Labs    12/26/18 1819 12/27/18 0421  WBC 20.3* 21.3*  HGB 9.3* 8.8*  HCT 31.7* 30.3*  PLT 332 307   BMET:  Recent Labs    12/26/18 2203 12/27/18 0421  NA 145 144  K 4.2 4.4  CL 117* 114*  CO2 21* 23  GLUCOSE 148* 150*  BUN 48* 54*  CREATININE 1.93* 2.11*  CALCIUM 7.9* 7.9*    PT/INR: No results for input(s): LABPROT, INR in the last 72 hours. ABG    Component Value Date/Time   PHART 7.279 (L) 12/26/2018 0621   HCO3 24.4 12/26/2018 0621   TCO2 26 12/26/2018 0621   ACIDBASEDEF 3.0 (H) 12/26/2018 0621   O2SAT 100.0 12/26/2018 0621   CBG (last 3)  Recent Labs    12/26/18 2342 12/27/18 0408 12/27/18 0801  GLUCAP 98 133* 128*    Assessment/Plan: S/P Procedure(s)  (LRB): VIDEO ASSISTED THORACOSCOPY, Stapling of Blebs (Left) Cont current level of care Wean neo as able for MAP 70 Not ready for vent wean Add Ceftriaxone for prob pneumonia   LOS: 18 days    Kathlee Nations Trigt III 12/27/2018

## 2018-12-27 NOTE — Progress Notes (Addendum)
NAME:  Matthew Cuevas, MRN:  [id number], DOB:  1941-01-19, LOS: 18 ADMISSION DATE:  12/27/2018, CONSULTATION DATE:  12/18/2018 REFERRING MD:  Jonathon Bellows, CHIEF COMPLAINT:  Shortness of breath  Brief History   78 y/o M initially admitted 2/23 to [facility] with bleb rupture / spontaneous pneumothorax. He had multiple tubes placed there with doxycycline pleurodesis but he had an adverse reaction and transferred to [facility] for further evaluation.  Known COPD with a BPF s/p bleb rupture 2/23. Failed chemical pleurodesis and EBV. On 3/13 he was planned to undergo VATS with stapling of blebs but suffered a cardiac arrest with induction requiring CPR / subsequent rib fractures.  Procedure aborted.  Course complicated by staph PNA and persistent air leak.   Past Medical History  COPD  HTN   Tobacco abuse  Significant Hospital Events   3/04 Admit  3/13 PEA arrest with induction, re-intubated 3/14 SVT during weaning attempt so aborted. Started on vanc for staph in sputum 3/20 L VATS with resection of blebs in LUL, pleural tent   Consults:    Procedures:  2/23  Admit to [facility]  3/04  Transfer to [facility]  3/13  Planned VATS, suffered cardiac arrest with induction, rib fractures.  Procedure aborted.  3/20  Returned to OR  Significant Diagnostic Tests:  CT Chest 3/5 >> large anterior left upper pneumothorax  Micro Data:  Sputum culture 3/13 >> MSSA Blood culture 3/6 >> negative Urine culture 3/6 >> 30k Enterococcus faecalis    Antimicrobials:  Cefepime 3/13 >> 3/16 Ancef 3/16 >> 2/21 Vancomycin 3/14 >> 3/16 Ceftriaxone 3/22 >>  Vanco 3/22 >>   Interim history/subjective:  Wife at bedside.  No acute events overnight.  RN reports increased WBC, afebrile.   Objective   Blood pressure (!) 115/95, pulse 91, temperature 98.3 F (36.8 C), temperature source Axillary, resp. rate 20, height 5\' 10"  (1.778 m), weight 87.5 kg, SpO2 99 %. CVP:  [11 mmHg] 11 mmHg  Vent Mode: PRVC FiO2  (%):  [40 %-50 %] 40 % Set Rate:  [16 bmp-18 bmp] 18 bmp Vt Set:  [580 mL] 580 mL PEEP:  [5 cmH20] 5 cmH20 Plateau Pressure:  [14 cmH20-42 cmH20] 14 cmH20   Intake/Output Summary (Last 24 hours) at 12/27/2018 0738 Last data filed at 12/27/2018 0600 Gross per 24 hour  Intake 3518.64 ml  Output 2900 ml  Net 618.64 ml   Filed Weights   12/24/18 0500 01-21-19 0345 12/27/18 0500  Weight: 80.8 kg 81.3 kg 87.5 kg   Physical Exam: General: elderly male lying in bed on vent, critically ill appearing  HEENT: MM pink/moist, ETT Neuro: sedate CV: s1s2 rrr, no m/r/g PULM: even/non-labored, clear on right, loud air leak on left, chest tube x2 AJ:OINO, non-tender, bsx4 active  Extremities: warm/dry, generalized 2-3+ pitting edema  Skin: no rashes or lesions  Resolved Hospital Problem list   PEA arrest (respiratory and anesthesia related 3/13)   Assessment & Plan:   Acute Hypoxic Respiratory Failure 2/2 AECOPD, MSSA PNA vs ATX and persistent left PTX w/ BPF. Rib Fractures from CPR.  -difficult to wean with hemodynamic instability (tachypnea, episodes SVT) during, BPF  P: Continue vent support, PRVC 8cc/kg Wean PEEP / FiO2 for sats > 90% Follow CXR PRN ABG  Post-operative care per CVTS  Continue brovana, pulmicort, xopenex + atrovent Add empiric pulmonary coverage with worsening CXR / WBC (changed to rocephin per CVTS) Await tracheal aspirate  Assess PCT   Hypotension -sedation confounding  factor  P: Wean neo for MAP >65 Minimize sedation as able  Assess cortisol   AKI Volume Overload P: Stop lasix gtt 3/22 Trend BMP / urinary output Replace electrolytes as indicated Avoid nephrotoxic agents, ensure adequate renal perfusion  SVT -recurrent with vent weaning efforts  P: Cardiology following, appreciate input  PRN lopressor  Wean neo as able   Agitated Delirium  P: Minimize sedation as able  Seroquel QHS   Hyperglycemia P: SSI, CBG Q4   GOC Discussion with wife  at bedside.  She indicates she wants to give him "a chance to get better" but does not want him to suffer unnecessarily.  She is anxious over COVID visitation policy.  Wants his children to be able to see him today if possible.  She indicates he would not want trach or further CPR in the event of arrest.      Best practice:  Diet: enteral nutrition Pain/Anxiety/Delirium protocol (if indicated): fentanyl gtt, PRN versed  VAP protocol (if indicated): in place DVT prophylaxis: SCD GI prophylaxis: famotidine  Glucose control: SSI  Mobility: As tolerated  Code Status: Full Family Communication: Wife updated 3/22 at bedside. Disposition: ICU    CC Time: 35 minutes   Canary Brim, NP-C [facility] Pgr: [phone] or if no answer [phone] 12/27/2018, 7:38 AM

## 2018-12-27 NOTE — Progress Notes (Signed)
Pharmacy Antibiotic Note  Matthew Cuevas is a 78 y.o. male admitted on 12/23/2018 with worsening CXR/increasing WBC.  Pharmacy has been consulted for vancomycin dosing for possible pneumonia, per CCM. WBC 21.3, PCT ordered, remains afebrile. Scr up at 2.11 (0.98 two days prior).  Vancomycin 1000 mg IV Q 24 hrs. Goal AUC 400-550. Expected AUC: 530 SCr used: 2.11  Plan: Vancomycin 2000 mg IV load x1, then 1000 mg IVq24h Ceftriaxone ordered per Dr. Donata Clay Monitor renal function, LOT, and clinical status Levels as needed   Height: 5\' 10"  (177.8 cm) Weight: 192 lb 14.4 oz (87.5 kg) IBW/kg (Calculated) : 73  Temp (24hrs), Avg:98.4 F (36.9 C), Min:97.8 F (36.6 C), Max:99.6 F (37.6 C)  Recent Labs  Lab 12/23/18 0247 12/18/2018 0353 12/26/18 0130 12/26/18 0439 12/26/18 1819 12/26/18 2203 12/27/18 0421  WBC 14.7* 13.4* 21.3* 21.4* 20.3*  --  21.3*  CREATININE 1.00 0.98  --  1.54*  --  1.93* 2.11*    Estimated Creatinine Clearance: 30.8 mL/min (A) (by C-G formula based on SCr of 2.11 mg/dL (H)).    Allergies  Allergen Reactions  . Doxycycline Shortness Of Breath and Other (See Comments)    Caused respiratory distress when they tried to use it in pleural space for pneumothorax  . Penicillins     Did it involve swelling of the face/tongue/throat, SOB, or low BP? Unknown Did it involve sudden or severe rash/hives, skin peeling, or any reaction on the inside of your mouth or nose? Unknown Did you need to seek medical attention at a hospital or doctor's office? Unknown When did it last happen? [place] If all above answers are "NO", may proceed with cephalosporin use.     Antimicrobials this admission: Ceftriaxone 3/21  Cefepime 3/13>>3/15 Vanc 3/14 >> 3/15, 3/21 > Cefazolin 3/16 >> 3/21  Dose adjustments this admission:   Microbiology results: 3/4 MRSA pcr: neg 3/6 UCx: enterococcus faecalis -pan s  3/6 BCx: negative 3/13 Resp Cx: few staph aureus  (panS) 3/19 Bcx: NG 3/21 TA: pending   Thank you for allowing pharmacy to be a part of this patient's care.   Marcelino Freestone, PharmD PGY2 Cardiology Pharmacy Resident Please check AMION for all Pharmacist numbers by unit 12/27/2018 11:36 AM  /

## 2018-12-27 NOTE — Progress Notes (Signed)
CT surgery p.m. Rounds  Patient had stable day Neo-Synephrine weaned to 250 mcg Chest tube output remains serosanguineous but hemoglobin remained stable at 8.8 CODE STATUS changed per wishes of spouse- no CPR or shocks but continue current care

## 2018-12-27 NOTE — Progress Notes (Signed)
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  He underwent doxycycline pleurodesis but had an adverse reaction and was transferred on 3/4 to [facility].  He had persistent air leak and on 3/13 had a cardiac arrest during induction for VATS procedure.  Course since then has been complicated by MSSA pneumonia and persistent air leak.  He remains critically ill, intubated and sedated on fentanyl drip. He is on amiodarone for atrial fibrillation/RVR and on high-dose Neo-Synephrine which is being tapered.  On exam-unresponsive, RA SS -2, prolonged expiration ventilator waveforms, decreased breath sounds on left, chest tubes show large air leak on anterior large bore chest tubes, S1-S2 regular, soft nontender abdomen, 2+ edema.  Chest x-ray personally reviewed 3/22 which shows extensive infiltrate on left with volume loss with no pneumothorax.  Labs show slight increase in creatinine to 2.1, procalcitonin remains high at 2.1, leukocytosis.  Impression/plan Acute hypoxic respiratory failure -unfortunately not ready to wean yet  MSSA pneumonia/extensive infiltrate on left lung post VATS -currently on ceftriaxone and vancomycin  COPD/persistent bronchopleural fistula-continue chest tube which has persistent air leak.  Septic shock-Neo-Synephrine being tapered Acute encephalopathy/delirium-on fentanyl drip AKI-hold diuresis due to rising creatinine and persistent need for pressors.  After discussion with wife, limited CODE STATUS issued, no CPR, no tracheostomy Will continue full medical care over the next few days to see if he improves.  The patient is critically ill with multiple organ systems failure and requires high complexity decision making for assessment and support, frequent evaluation and titration of therapies, application of advanced monitoring technologies and extensive interpretation of multiple databases. Critical Care Time devoted to patient  care services described in this note independent of APP/resident  time is 35 minutes.   Comer Locket Vassie Loll MD

## 2018-12-28 ENCOUNTER — Encounter (HOSPITAL_COMMUNITY): Payer: Self-pay | Admitting: Thoracic Surgery (Cardiothoracic Vascular Surgery)

## 2018-12-28 ENCOUNTER — Inpatient Hospital Stay (HOSPITAL_COMMUNITY): Payer: Medicare Other

## 2018-12-28 LAB — COMPREHENSIVE METABOLIC PANEL
ALT: 5 U/L (ref 0–44)
AST: 40 U/L (ref 15–41)
Albumin: 2.3 g/dL — ABNORMAL LOW (ref 3.5–5.0)
Alkaline Phosphatase: 181 U/L — ABNORMAL HIGH (ref 38–126)
Anion gap: 10 (ref 5–15)
BUN: 61 mg/dL — ABNORMAL HIGH (ref 8–23)
CO2: 21 mmol/L — ABNORMAL LOW (ref 22–32)
Calcium: 7.7 mg/dL — ABNORMAL LOW (ref 8.9–10.3)
Chloride: 113 mmol/L — ABNORMAL HIGH (ref 98–111)
Creatinine, Ser: 2.34 mg/dL — ABNORMAL HIGH (ref 0.61–1.24)
GFR calc Af Amer: 30 mL/min — ABNORMAL LOW (ref >60)
GFR calc non Af Amer: 26 mL/min — ABNORMAL LOW (ref >60)
Glucose, Bld: 96 mg/dL (ref 70–99)
Potassium: 5.7 mmol/L — ABNORMAL HIGH (ref 3.5–5.1)
Sodium: 144 mmol/L (ref 135–145)
Total Bilirubin: 1.3 mg/dL — ABNORMAL HIGH (ref 0.3–1.2)
Total Protein: 5 g/dL — ABNORMAL LOW (ref 6.5–8.1)

## 2018-12-28 LAB — CBC
HCT: 29.1 % — ABNORMAL LOW (ref 39.0–52.0)
HCT: 28.7 % — ABNORMAL LOW (ref 39.0–52.0)
Hemoglobin: 8.7 g/dL — ABNORMAL LOW (ref 13.0–17.0)
Hemoglobin: 8.4 g/dL — ABNORMAL LOW (ref 13.0–17.0)
MCH: 29.3 pg (ref 26.0–34.0)
MCH: 28.4 pg (ref 26.0–34.0)
MCHC: 29.9 g/dL — ABNORMAL LOW (ref 30.0–36.0)
MCHC: 29.3 g/dL — ABNORMAL LOW (ref 30.0–36.0)
MCV: 98 fL (ref 80.0–100.0)
MCV: 97 fL (ref 80.0–100.0)
Platelets: 363 10*3/uL (ref 150–400)
Platelets: 279 10*3/uL (ref 150–400)
RBC: 2.97 MIL/uL — ABNORMAL LOW (ref 4.22–5.81)
RBC: 2.96 MIL/uL — ABNORMAL LOW (ref 4.22–5.81)
RDW: 20.2 % — ABNORMAL HIGH (ref 11.5–15.5)
RDW: 17.8 % — ABNORMAL HIGH (ref 11.5–15.5)
WBC: 18.2 10*3/uL — ABNORMAL HIGH (ref 4.0–10.5)
WBC: 14.9 10*3/uL — ABNORMAL HIGH (ref 4.0–10.5)
nRBC: 0.1 % (ref 0.0–0.2)
nRBC: 0.1 % (ref 0.0–0.2)

## 2018-12-28 LAB — POCT I-STAT 4, (NA,K, GLUC, HGB,HCT)
Glucose, Bld: 124 mg/dL — ABNORMAL HIGH (ref 70–99)
HEMATOCRIT: 25 % — AB (ref 39.0–52.0)
Hemoglobin: 8.5 g/dL — ABNORMAL LOW (ref 13.0–17.0)
Potassium: 5.9 mmol/L — ABNORMAL HIGH (ref 3.5–5.1)
Sodium: 145 mmol/L (ref 135–145)

## 2018-12-28 LAB — CULTURE, RESPIRATORY W GRAM STAIN
Culture: NORMAL
Special Requests: NORMAL

## 2018-12-28 LAB — GLUCOSE, CAPILLARY
Glucose-Capillary: 91 mg/dL (ref 70–99)
Glucose-Capillary: 99 mg/dL (ref 70–99)
Glucose-Capillary: 91 mg/dL (ref 70–99)
Glucose-Capillary: 109 mg/dL — ABNORMAL HIGH (ref 70–99)
Glucose-Capillary: 91 mg/dL (ref 70–99)

## 2018-12-28 LAB — PROCALCITONIN: Procalcitonin: 1.8 ng/mL

## 2018-12-28 LAB — CORTISOL-AM, BLOOD: Cortisol - AM: 15.7 ug/dL (ref 6.7–22.6)

## 2018-12-28 MED ORDER — SODIUM BICARBONATE 8.4 % IV SOLN
50.0000 meq | Freq: Once | INTRAVENOUS | Status: AC
  Administered 2018-12-28: 50 meq via INTRAVENOUS
  Filled 2018-12-28: qty 50

## 2018-12-28 MED ORDER — DEXTROSE 50 % IV SOLN
1.0000 | Freq: Once | INTRAVENOUS | Status: AC
  Administered 2018-12-28: 50 mL via INTRAVENOUS
  Filled 2018-12-28: qty 50

## 2018-12-28 MED ORDER — SODIUM POLYSTYRENE SULFONATE 15 GM/60ML PO SUSP
30.0000 g | Freq: Once | ORAL | Status: AC
  Administered 2018-12-28: 30 g
  Filled 2018-12-28: qty 120

## 2018-12-28 MED ORDER — ENOXAPARIN SODIUM 30 MG/0.3ML ~~LOC~~ SOLN
30.0000 mg | Freq: Every day | SUBCUTANEOUS | Status: DC
  Administered 2018-12-28: 30 mg via SUBCUTANEOUS
  Filled 2018-12-28: qty 0.3

## 2018-12-28 MED ORDER — INSULIN ASPART 100 UNIT/ML IV SOLN
10.0000 [IU] | Freq: Once | INTRAVENOUS | Status: AC
  Administered 2018-12-28: 10 [IU] via INTRAVENOUS

## 2018-12-28 MED ORDER — FUROSEMIDE 10 MG/ML IJ SOLN
40.0000 mg | Freq: Four times a day (QID) | INTRAMUSCULAR | Status: AC
  Administered 2018-12-28 – 2018-12-29 (×3): 40 mg via INTRAVENOUS
  Filled 2018-12-28 (×3): qty 4

## 2018-12-28 MED ORDER — FAMOTIDINE 20 MG PO TABS: 20.0000 mg | ORAL_TABLET | Freq: Every day | ORAL | Status: DC

## 2018-12-28 MED ORDER — ALBUMIN HUMAN 25 % IV SOLN
25.0000 g | Freq: Four times a day (QID) | INTRAVENOUS | Status: AC
  Administered 2018-12-28 – 2018-12-29 (×3): 25 g via INTRAVENOUS
  Filled 2018-12-28 (×4): qty 50

## 2018-12-28 MED ORDER — HYDROCORTISONE NA SUCCINATE PF 100 MG IJ SOLR
50.0000 mg | Freq: Four times a day (QID) | INTRAMUSCULAR | Status: DC
  Administered 2018-12-28 – 2018-12-29 (×3): 50 mg via INTRAVENOUS
  Filled 2018-12-28 (×3): qty 2

## 2018-12-28 NOTE — Progress Notes (Signed)
eLink Physician-Brief Progress Note Patient Name: Matthew Cuevas DOB: 21-Jun-1941 MRN: [id number]   Date of Service  12/28/2018  HPI/Events of Note  K+ = 5.7 and Creatinine = 2.34. QRS not widened or T wave peaked on EKG.  eICU Interventions  Will order: 1. Kayexalate 30 gm per tube now. 2. Repeat BMP at 12 noon.      Intervention Category Major Interventions: Electrolyte abnormality - evaluation and management  Kaleena Corrow Eugene 12/28/2018, 6:24 AM

## 2018-12-28 NOTE — Progress Notes (Signed)
      [address]       [name] [address]             [phone]       I talked with Mrs. Larin  She is certain he would not to be on dialysis so will not start that per his wishes  Will reassess renal function in AM  Consider extubation in AM, which would likely be terminal  Viviann Spare C. Dorris Fetch, MD [facility] [phone]

## 2018-12-28 NOTE — Progress Notes (Signed)
3 Days Post-Op Procedure(s) (LRB): VIDEO ASSISTED THORACOSCOPY, Stapling of Blebs (Left) Subjective: Intubated, sedated   Objective: Vital signs in last 24 hours: Temp:  [98 F (36.7 C)-99.2 F (37.3 C)] 98.4 F (36.9 C) (03/23 0700) Pulse Rate:  [81-97] 90 (03/23 0700) Cardiac Rhythm: Atrial fibrillation (03/23 0400) Resp:  [17-28] 20 (03/23 0700) BP: (79-135)/(41-74) 135/66 (03/23 0700) SpO2:  [81 %-100 %] 96 % (03/23 0700) FiO2 (%):  [40 %] 40 % (03/23 0458) Weight:  [72 kg] 72 kg (03/23 0500)  Hemodynamic parameters for last 24 hours: CVP:  [15 mmHg] 15 mmHg  Intake/Output from previous day: 03/22 0701 - 03/23 0700 In: 4777.9 [I.V.:3278.8; NG/GT:740; IV Piggyback:759] Out: 6060 [OKHTX:7741; Emesis/NG output:550; Chest Tube:1088] Intake/Output this shift: No intake/output data recorded.  General appearance: intubated Heart: regular rate and rhythm Lungs: diminished breath sounds on leftr Abdomen: normal findings: soft, non-tender minimal air leak  Lab Results: Recent Labs    12/27/18 1704 12/28/18 0300  WBC 17.3* 18.2*  HGB 8.6* 8.7*  HCT 29.1* 29.1*  PLT 287 363   BMET:  Recent Labs    12/27/18 0421 12/28/18 0300  NA 144 144  K 4.4 5.7*  CL 114* 113*  CO2 23 21*  GLUCOSE 150* 96  BUN 54* 61*  CREATININE 2.11* 2.34*  CALCIUM 7.9* 7.7*    PT/INR: No results for input(s): LABPROT, INR in the last 72 hours. ABG    Component Value Date/Time   PHART 7.279 (L) 12/26/2018 0621   HCO3 24.4 12/26/2018 0621   TCO2 26 12/26/2018 0621   ACIDBASEDEF 3.0 (H) 12/26/2018 0621   O2SAT 100.0 12/26/2018 0621   CBG (last 3)  Recent Labs    12/27/18 2350 12/28/18 0327 12/28/18 0752  GLUCAP 109* 91 99    Assessment/Plan: S/P Procedure(s) (LRB): VIDEO ASSISTED THORACOSCOPY, Stapling of Blebs (Left) -Remains critically ill In SR bit requiring high doses of neo to maintain BP VDRF- staph pneumonia, rib fractures, air leak in setting of COPD  Vent per  CCM  Air leak nearly completely resolved- leave tubes on suction ID- MSSA pneumonia treated with ancef, currently on vanco and ceftriaxone  will stop Vanco  Acute renal failure- Creatinine up to 2.34, good UOP over night  CVP 15 so not intravascularly dry Tube feeds on hold currently    LOS: 19 days    [name] 12/28/2018

## 2018-12-28 NOTE — Progress Notes (Signed)
Patient ID: Matthew Cuevas, male   DOB: 12-24-1940, 78 y.o.   MRN: [id number] TCTS Evening Rounds:  Hemodynamically stable but on high dose neo  K+ 5.9 this pm, 5.7 this am.  Received Kayexalate this am.  Receiving some insulin and glucose now.  Urine output has been good.   CBC    Component Value Date/Time   WBC 14.9 (H) 12/28/2018 1707   RBC 2.96 (L) 12/28/2018 1707   HGB 8.4 (L) 12/28/2018 1707   HCT 28.7 (L) 12/28/2018 1707   PLT 279 12/28/2018 1707   MCV 97.0 12/28/2018 1707   MCH 28.4 12/28/2018 1707   MCHC 29.3 (L) 12/28/2018 1707   RDW 17.8 (H) 12/28/2018 1707   LYMPHSABS 0.9 12/26/2018 0439   MONOABS 2.1 (H) 12/26/2018 0439   EOSABS 0.0 12/26/2018 0439   BASOSABS 0.0 12/26/2018 0439

## 2018-12-28 NOTE — Progress Notes (Signed)
Tube feeds were coming out of patients mouth, call CCM, got abd xray, it showed placement in correct area, pt still has tube feeding exiting mouth, put low-suction to OG tube and removed 550cc of tube feeds, called CCM again and was told to discontinue tube feeds tonight.

## 2018-12-28 NOTE — Care Management Note (Signed)
Case Management Note Initial CM Note documented by Letha Cape RNCM Patient Details  Name: Matthew Cuevas MRN: [id number] Date of Birth: [birth year]  Subjective/Objective:  From home with spouse, spontaneous ptx, has chest tubes x2  in to suction. CVTS consulted, another pigtail chest tube placed today, has positive air leak .    DME- none PCP- Dr. Jenita Seashore at [facility]- wife Meds- no problem getting [facility] [address]                  Action/Plan: NCM will follow for transition of care needs.  Expected Discharge Date:                  Expected Discharge Plan:  Home w Home Health Services  In-House Referral:     Discharge planning Services  CM Consult  Post Acute Care Choice:    Choice offered to:     DME Arranged:    DME Agency:     HH Arranged:    HH Agency:     Status of Service:  In process, will continue to follow  If discussed at Long Length of Stay Meetings, dates discussed:    Additional Comments: 12/28/18 @ 1336-[name] RNCM-CM continue following for dispositional needs. Patient is s/p VATs 3/20 and remains critically ill with full vent support; new Code status issued by family. CM team will continue to follow.   Colleen Can RN, BSN, NCM-BC, ACM-RN [phone] 12/28/2018, 1:35 PM

## 2018-12-28 NOTE — Anesthesia Postprocedure Evaluation (Addendum)
Anesthesia Post Note  Patient: Matthew Cuevas  Procedure(s) Performed: VIDEO ASSISTED THORACOSCOPY, Stapling of Blebs (Left Chest)     Patient location during evaluation: SICU Anesthesia Type: General Level of consciousness: patient remains intubated per anesthesia plan Pain management: pain level controlled Vital Signs Assessment: post-procedure vital signs reviewed and stable Respiratory status: patient remains intubated per anesthesia plan and patient on ventilator - see flowsheet for VS Cardiovascular status: stable Anesthetic complications: no    Last Vitals:  Vitals:   12/28/18 1100 12/28/18 1200  BP: (!) 133/45 (!) 127/47  Pulse: 90 73  Resp: 18 18  Temp: 36.8 C   SpO2: 91% 96%    Last Pain:  Vitals:   12/28/18 1200  TempSrc:   PainSc: 0-No pain                 Lewie Loron

## 2018-12-28 NOTE — Progress Notes (Signed)
NAME:  Matthew Cuevas, MRN:  [id number], DOB:  [birth year], LOS: 19 ADMISSION DATE:  2019-01-05, CONSULTATION DATE:  12/18/2018 REFERRING MD:  Jonathon Bellows, CHIEF COMPLAINT:  Shortness of breath  Brief History   78 y/o M initially admitted 2/23 to [facility] with bleb rupture / spontaneous pneumothorax. He had multiple tubes placed there with doxycycline pleurodesis but he had an adverse reaction and transferred to [facility] for further evaluation.  Known COPD with a BPF s/p bleb rupture 2/23. Failed chemical pleurodesis and EBV. On 3/13 he was planned to undergo VATS with stapling of blebs but suffered a cardiac arrest with induction requiring CPR / subsequent rib fractures.  Procedure aborted.  Course complicated by staph PNA and persistent air leak.   Past Medical History  COPD  HTN   Tobacco abuse  Significant Hospital Events   3/04 Admit  3/13 PEA arrest with induction, re-intubated 3/14 SVT during weaning attempt so aborted. Started on vanc for staph in sputum 3/20 L VATS with resection of blebs in LUL, pleural tent   Consults:    Procedures:  2/23  Admit to [facility]  3/04  Transfer to [facility]  3/13  Planned VATS, suffered cardiac arrest with induction, rib fractures.  Procedure aborted.  3/20  Returned to OR  Significant Diagnostic Tests:  CT Chest 3/5 >> large anterior left upper pneumothorax  Micro Data:  Sputum culture 3/13 >> MSSA Blood culture 3/6 >> negative Urine culture 3/6 >> 30k Enterococcus faecalis    Antimicrobials:  Cefepime 3/13 >> 3/16 Ancef 3/16 >> 2/21 Vancomycin 3/14 >> 3/16 Ceftriaxone 3/22 >>  Vanco 3/22 >>   Interim history/subjective:  No events overnight, failed weaning  Objective   Blood pressure (!) 133/45, pulse 90, temperature 98.4 F (36.9 C), temperature source Axillary, resp. rate 18, height 5\' 10"  (1.778 m), weight 72 kg, SpO2 91 %. CVP:  [15 mmHg] 15 mmHg  Vent Mode: PRVC FiO2 (%):  [40 %] 40 % Set Rate:  [18 bmp] 18 bmp Vt  Set:  [580 mL] 580 mL PEEP:  [5 cmH20] 5 cmH20 Pressure Support:  [5 cmH20] 5 cmH20 Plateau Pressure:  [26 cmH20-33 cmH20] 32 cmH20   Intake/Output Summary (Last 24 hours) at 12/28/2018 1155 Last data filed at 12/28/2018 1100 Gross per 24 hour  Intake 4613.56 ml  Output 3703 ml  Net 910.56 ml   Filed Weights   12/26/2018 0345 12/27/18 0500 12/28/18 0500  Weight: 81.3 kg 87.5 kg 72 kg   Physical Exam: General: Acute on chronically ill appearing male, NAD HEENT: [place]/AT, PERRL, EOM-I and MMM Neuro: Sedate, withdrawing ext to pain CV: RRR, Nl S1/S2 and -M/R/G PULM: Coarse BS diffusely GI: Soft, NT, ND and +BS Extremities: 3+ edema diffusely Skin: Intact  Resolved Hospital Problem list   PEA arrest (respiratory and anesthesia related 3/13)   Assessment & Plan:   Acute Hypoxic Respiratory Failure 2/2 AECOPD, MSSA PNA vs ATX and persistent left PTX w/ BPF. Rib Fractures from CPR.  -difficult to wean with hemodynamic instability (tachypnea, episodes SVT) during, BPF  P: Continue full vent support Wean PEEP / FiO2 for sats > 90% Follow CXR Adjust vent for ABG Post-operative care per CVTS  Continue brovana, pulmicort, xopenex + atrovent Add empiric pulmonary coverage with worsening CXR / WBC (changed to rocephin per CVTS) D/C Vancomycin  Hypotension -sedation confounding factor  P: Neo at 180 for MAP of 65 mmHg Minimize sedation as able  Stress dose steroids Cortisol 15.7  AKI Volume Overload P: Albumin and lasix Trend BMP / urinary output Replace electrolytes as indicated Avoid nephrotoxic agents, ensure adequate renal perfusion  SVT -recurrent with vent weaning efforts  P: Cardiology following, appreciate input  PRN lopressor  Wean neo as able   Agitated Delirium  P: Minimize sedation as able  Seroquel QHS   Hyperglycemia P: SSI, CBG Q4   GOC Spoke with wife, discussing dialysis, code status confirmed, will let us know in AM   Best practice:  Diet:  enteral nutrition Pain/Anxiety/Delirium protocol (if indicated): fentanyl gtt, PRN versed  VAP protocol (if indicated): in place DVT prophylaxis: SCD GI prophylaxis: famotidine  Glucose control: SSI  Mobility: As tolerated  Code Status: Full Family Communication: Wife updated 3/22 at bedside. Disposition: ICU    The patient is critically ill with multiple organ systems failure and requires high complexity decision making for assessment and support, frequent evaluation and titration of therapies, application of advanced monitoring technologies and extensive interpretation of multiple databases.   Critical Care Time devoted to patient care services described in this note is  32  Minutes. This time reflects time of care of this signee Dr Koren Bound. This critical care time does not reflect procedure time, or teaching time or supervisory time of PA/NP/Med student/Med Resident etc but could involve care discussion time.  Alyson Reedy, M.D. [facility] Pulmonary/Critical Care Medicine. Pager: [phone]. After hours pager: [phone].

## 2018-12-29 ENCOUNTER — Inpatient Hospital Stay (HOSPITAL_COMMUNITY): Payer: Medicare Other

## 2018-12-29 LAB — CULTURE, BLOOD (ROUTINE X 2)
Culture: NO GROWTH
Culture: NO GROWTH
SPECIAL REQUESTS: ADEQUATE

## 2018-12-29 LAB — GLUCOSE, CAPILLARY
Glucose-Capillary: 110 mg/dL — ABNORMAL HIGH (ref 70–99)
Glucose-Capillary: 110 mg/dL — ABNORMAL HIGH (ref 70–99)
Glucose-Capillary: 125 mg/dL — ABNORMAL HIGH (ref 70–99)

## 2018-12-29 LAB — CBC
HCT: 25.1 % — ABNORMAL LOW (ref 39.0–52.0)
Hemoglobin: 7.6 g/dL — ABNORMAL LOW (ref 13.0–17.0)
MCH: 28.9 pg (ref 26.0–34.0)
MCHC: 30.3 g/dL (ref 30.0–36.0)
MCV: 95.4 fL (ref 80.0–100.0)
Platelets: 258 10*3/uL (ref 150–400)
RBC: 2.63 MIL/uL — ABNORMAL LOW (ref 4.22–5.81)
RDW: 17.5 % — ABNORMAL HIGH (ref 11.5–15.5)
WBC: 12 10*3/uL — ABNORMAL HIGH (ref 4.0–10.5)
nRBC: 0.2 % (ref 0.0–0.2)

## 2018-12-29 LAB — COMPREHENSIVE METABOLIC PANEL
ALT: 12 U/L (ref 0–44)
AST: 32 U/L (ref 15–41)
Albumin: 2.8 g/dL — ABNORMAL LOW (ref 3.5–5.0)
Alkaline Phosphatase: 194 U/L — ABNORMAL HIGH (ref 38–126)
Anion gap: 13 (ref 5–15)
BUN: 61 mg/dL — ABNORMAL HIGH (ref 8–23)
CO2: 26 mmol/L (ref 22–32)
Calcium: 8.4 mg/dL — ABNORMAL LOW (ref 8.9–10.3)
Chloride: 110 mmol/L (ref 98–111)
Creatinine, Ser: 2.53 mg/dL — ABNORMAL HIGH (ref 0.61–1.24)
GFR calc Af Amer: 27 mL/min — ABNORMAL LOW (ref >60)
GFR calc non Af Amer: 24 mL/min — ABNORMAL LOW (ref >60)
Glucose, Bld: 130 mg/dL — ABNORMAL HIGH (ref 70–99)
Potassium: 3.6 mmol/L (ref 3.5–5.1)
Sodium: 149 mmol/L — ABNORMAL HIGH (ref 135–145)
Total Bilirubin: 0.8 mg/dL (ref 0.3–1.2)
Total Protein: 5.4 g/dL — ABNORMAL LOW (ref 6.5–8.1)

## 2018-12-29 LAB — MAGNESIUM: Magnesium: 2.2 mg/dL (ref 1.7–2.4)

## 2018-12-29 LAB — PROCALCITONIN: Procalcitonin: 1.39 ng/mL

## 2018-12-29 LAB — PHOSPHORUS: Phosphorus: 4.5 mg/dL (ref 2.5–4.6)

## 2018-12-29 MED ORDER — DEXTROSE 5 % IV SOLN: INTRAVENOUS | Status: DC

## 2018-12-29 MED ORDER — GLYCOPYRROLATE 1 MG PO TABS
1.0000 mg | ORAL_TABLET | ORAL | Status: DC | PRN
  Filled 2018-12-29: qty 1

## 2018-12-29 MED ORDER — DIPHENHYDRAMINE HCL 50 MG/ML IJ SOLN: 25.0000 mg | INTRAMUSCULAR | Status: DC | PRN

## 2018-12-29 MED ORDER — GLYCOPYRROLATE 0.2 MG/ML IJ SOLN: 0.2000 mg | INTRAMUSCULAR | Status: DC | PRN

## 2018-12-29 MED ORDER — ACETAMINOPHEN 325 MG PO TABS: 650.0000 mg | ORAL_TABLET | Freq: Four times a day (QID) | ORAL | Status: DC | PRN

## 2018-12-29 MED ORDER — POLYVINYL ALCOHOL 1.4 % OP SOLN
1.0000 [drp] | Freq: Four times a day (QID) | OPHTHALMIC | Status: DC | PRN
  Filled 2018-12-29: qty 15

## 2018-12-29 MED ORDER — MIDAZOLAM 50MG/50ML (1MG/ML) PREMIX INFUSION
0.5000 mg/h | INTRAVENOUS | Status: DC
  Administered 2018-12-29: 2 mg/h via INTRAVENOUS

## 2018-12-29 MED ORDER — ACETAMINOPHEN 650 MG RE SUPP: 650.0000 mg | Freq: Four times a day (QID) | RECTAL | Status: DC | PRN

## 2019-01-06 NOTE — Procedures (Signed)
Extubation Procedure Note  Patient Details:   Name: TACOMA DRUMM DOB: [birth year] MRN: [id number]   Airway Documentation:  Airway 8.5 mm (Active)  Secured at (cm) 24 cm 01/28/2019  3:08 AM  Measured From Lips 2019-01-28  3:08 AM  Secured Location Left 01-28-19  3:08 AM  Secured By [employer] 28-Jan-2019  3:08 AM  Tube Holder Repositioned Yes 2019-01-28  3:08 AM  Site Condition Dry 01/28/2019  3:08 AM   Vent end date: 12/25/2018 Vent end time: 1550   Evaluation  O2 sats: stable throughout Complications: No apparent complications Patient did tolerate procedure well. Bilateral Breath Sounds: Rhonchi   No   Terminal wean.  Perfecto Purdy 01/28/19, 9:45 AM

## 2019-01-06 NOTE — Progress Notes (Signed)
4 Days Post-Op Procedure(s) (LRB): VIDEO ASSISTED THORACOSCOPY, Stapling of Blebs (Left) Subjective: Intubated, to be extubated this Am  Objective: Vital signs in last 24 hours: Temp:  [97.5 F (36.4 C)-98.2 F (36.8 C)] 98.1 F (36.7 C) (03/24 0330) Pulse Rate:  [62-95] 72 (03/24 0900) Cardiac Rhythm: Normal sinus rhythm (03/24 0800) Resp:  [14-26] 18 (03/24 0900) BP: (72-142)/(41-89) 102/48 (03/24 0900) SpO2:  [89 %-99 %] 97 % (03/24 0900) FiO2 (%):  [40 %] 40 % (03/24 0308) Weight:  [86.9 kg] 86.9 kg (03/24 0330)  Hemodynamic parameters for last 24 hours: CVP:  [15 mmHg] 15 mmHg  Intake/Output from previous day: 03/23 0701 - 03/24 0700 In: 3144.7 [P.O.:240; I.V.:2561.5; NG/GT:120; IV Piggyback:163.2] Out: 5490 [Urine:4430; Chest Tube:1060] Intake/Output this shift: Total I/O In: 134 [I.V.:134] Out: -   Heart: regular rate and rhythm Lungs: rhonchi bilaterally small air leak  Lab Results: Recent Labs    12/28/18 1707 12/19/2018 0505  WBC 14.9* 12.0*  HGB 8.4* 7.6*  HCT 28.7* 25.1*  PLT 279 258   BMET:  Recent Labs    12/28/18 0300 12/28/18 1654 12/12/2018 0505  NA 144 145 149*  K 5.7* 5.9* 3.6  CL 113*  --  110  CO2 21*  --  26  GLUCOSE 96 124* 130*  BUN 61*  --  61*  CREATININE 2.34*  --  2.53*  CALCIUM 7.7*  --  8.4*    PT/INR: No results for input(s): LABPROT, INR in the last 72 hours. ABG    Component Value Date/Time   PHART 7.279 (L) 12/26/2018 0621   HCO3 24.4 12/26/2018 0621   TCO2 26 12/26/2018 0621   ACIDBASEDEF 3.0 (H) 12/26/2018 0621   O2SAT 100.0 12/26/2018 0621   CBG (last 3)  Recent Labs    12/28/18 1558 12/28/18 2000 12/28/2018 0334  GLUCAP 109* 91 110*    Assessment/Plan: S/P Procedure(s) (LRB): VIDEO ASSISTED THORACOSCOPY, Stapling of Blebs (Left) -Air leak is smaller but still present Hyperkalemia improved Family at bedside. They have decided to extubate with no plan for reintubation Defer to critical care   LOS: 20  days    [name] 12/27/2018

## 2019-01-06 NOTE — Progress Notes (Signed)
Pt terminally weaned to room air. Per MD order.  Family is currently at bedside with pt.

## 2019-01-06 NOTE — Progress Notes (Signed)
Call placed to [facility] to report patient's death. Referral #[id number]. Ok for release to funeral home. Information relayed to bedside RN.

## 2019-01-06 NOTE — Progress Notes (Addendum)
NAME:  Matthew Cuevas, MRN:  [id number], DOB:  1941-09-01, LOS: 20 ADMISSION DATE:  12-21-2018, CONSULTATION DATE:  12/18/2018 REFERRING MD:  Jonathon Bellows, CHIEF COMPLAINT:  Shortness of breath  Brief History   78 y/o M initially admitted 2/23 to [facility] with bleb rupture / spontaneous pneumothorax. He had multiple tubes placed there with doxycycline pleurodesis but he had an adverse reaction and transferred to [facility] for further evaluation.  Known COPD with a BPF s/p bleb rupture 2/23. Failed chemical pleurodesis and EBV. On 3/13 he was planned to undergo VATS with stapling of blebs but suffered a cardiac arrest with induction requiring CPR / subsequent rib fractures.  Procedure aborted.  Course complicated by staph PNA and persistent air leak.   Past Medical History  COPD  HTN   Tobacco abuse  Significant Hospital Events   3/04 Admit  3/13 PEA arrest with induction, re-intubated 3/14 SVT during weaning attempt so aborted. Started on vanc for staph in sputum 3/20 L VATS with resection of blebs in LUL, pleural tent   Consults:  CVTS  Procedures:  2/23  Admit to [facility]  3/04  Transfer to [facility]  3/13  Planned VATS, suffered cardiac arrest with induction, rib fractures.  Procedure aborted.  3/20  Returned to OR  Significant Diagnostic Tests:  CT Chest 3/5 >> large anterior left upper pneumothorax  Micro Data:  Sputum culture 3/13 >> MSSA Blood culture 3/6 >> negative Urine culture 3/6 >> 30k Enterococcus faecalis    Antimicrobials:  Cefepime 3/13 >> 3/16 Ancef 3/16 >> 2/21 Vancomycin 3/14 >> 3/16 Ceftriaxone 3/22 >>  Vanco 3/22 >>   Interim history/subjective:  No events overnight, failed weaning. Family at bedside asking to pursue comfort measures.   Objective   Blood pressure (!) 113/52, pulse 70, temperature 98.1 F (36.7 C), temperature source Axillary, resp. rate 19, height 5\' 10"  (1.778 m), weight 86.9 kg, SpO2 96 %. CVP:  [15 mmHg] 15 mmHg  Vent Mode:  PRVC FiO2 (%):  [40 %] 40 % Set Rate:  [18 bmp] 18 bmp Vt Set:  [580 mL] 580 mL PEEP:  [5 cmH20] 5 cmH20 Plateau Pressure:  [24 cmH20-32 cmH20] 28 cmH20   Intake/Output Summary (Last 24 hours) at 12/11/2018 0831 Last data filed at 12/21/2018 0800 Gross per 24 hour  Intake 2978.53 ml  Output 5385 ml  Net -2406.47 ml   Filed Weights   12/27/18 0500 12/28/18 0500 12/18/2018 0330  Weight: 87.5 kg 72 kg 86.9 kg   Physical Exam: General: elderly critically ill appearing male in NAD HEENT: [place]/AT, PERRL Neuro: Sedated CV: RRR, no MRG PULM: Coarse BS diffusely. L > R. GI: Soft, ND, Normoactive Extremities: Diffuse edema Skin: Intact  Resolved Hospital Problem list   PEA arrest (respiratory and anesthesia related 3/13)   Assessment & Plan:   Acute Hypoxic Respiratory Failure 2/2 AECOPD, MSSA PNA vs ATX and persistent left PTX w/ BPF. Rib Fractures from CPR.  -difficult to wean with hemodynamic instability (tachypnea, episodes SVT) during, BPF  P: - Family electing to pursue compassionate extubation.  - Fentanyl infusing - RT to follow compassionate extubation protocol - Dr Dorris Fetch has been included in this discussion and accepts family decision.   Hypotension -sedation confounding factor  P: - Wean pressors to OFF after extubation  AKI: patient would not want HD, he has made this clear to his family.  Volume Overload Agitated Delirium  Hyperglycemia - Discontinue treatment in respect to family wishes for  comfort care.   Wife and both children at bedside this morning for comfort care.    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  On exam, unresponsive and not following commands.  I reviewed CXR myself, ETT is in a good position.  Discussed with PCCM-NP.  Spoke with family, plan to extubate today to comfort.   Started morphine drip, will add versed.  Emphasize comfort at this point.  Family updated.  PCCM will continue to follow.  The patient is critically ill with multiple organ systems failure and requires high complexity decision making for assessment and support, frequent evaluation and titration of therapies, application of advanced monitoring technologies and extensive interpretation of multiple databases.   Critical Care Time devoted to patient care services described in this note is  32  Minutes. This time reflects time of care of this signee Dr Koren Bound. This critical care time does not reflect procedure time, or teaching time or supervisory time of PA/NP/Med student/Med Resident etc but could involve care discussion time.  Alyson Reedy, M.D. [facility] Pulmonary/Critical Care Medicine. Pager: [phone]. After hours pager: [phone].

## 2019-01-06 DEATH — deceased

## 2019-02-05 NOTE — Death Summary Note (Signed)
DEATH SUMMARY   Patient Details  Name: Matthew Cuevas MRN: [id number] DOB: 06-11-1941  Admission/Discharge Information   Admit Date:  Dec 24, 2018  Date of Death: Date of Death: 2019-01-13  Time of Death: Time of Death: 1011/02/11  Length of Stay: 2023-02-09  Referring Physician: System, Pcp Not In   Reason(s) for Hospitalization  Pneumothorax  Diagnoses  Preliminary cause of death:  Pulseless electrical activity cardiac arrest  Secondary Diagnoses (including complications and co-morbidities):  Principal Problem:   Pneumothorax on left: Large upper anterior per CT chest 12/10/2018 Active Problems:   Spontaneous pneumothorax   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 He had multiple tubes placed there with doxycycline pleurodesis but he had an adverse reaction and transferred to [facility] for further evaluation.  Known COPD with a BPF s/p bleb rupture 2/23. Failed chemical pleurodesis and EBV. On 3/13 he was planned to undergo VATS with stapling of blebs but suffered a cardiac arrest with induction requiring CPR / subsequent rib fractures.  Procedure aborted.  Course complicated by staph PNA and persistent air leak.   Patient suffered a cardiac arrest on 3/13 with ROSC then VATS on 3/20.  On 01/13/2023, had a family meeting and family elected to proceed with compassionate extubation.  Versed and morphine were started.  Patient was extubated to expire shortly thereafter with the family  bedside.  Pertinent Labs and Studies  Significant Diagnostic Studies Dg Chest 1 View  Result Date: 12/18/2018 CLINICAL DATA:  78 year old male status post canceled operative procedure due to deterioration in the OR. Spontaneous left pneumothorax, COPD and emphysema with bleb rupture. Chemical pleurodesis on 12/07/2018. EXAM: CHEST  1 VIEW COMPARISON:  0745 hours today. FINDINGS: Portable AP supine intraoperative view at 1352 hours. There appears to be an esophageal endoscope in place. There is a right IJ approach central line with tip at the level of the right innominate vein. Endotracheal tube with small electronic device projecting at the tip is above the carina. There are 2 pigtail type left chest tubes in place. The more cephalad tube is stable but might be disconnected in the soft tissues as before, uncertain. A 2nd tube is present at the left costophrenic angle. Small left apical pneumothorax is stable. There are intrabronchial occluded device is redemonstrated at the left hilum. Left chest wall subcutaneous gas has not significantly changed. Stable ventilation otherwise with large lung volumes. Stable cardiac size and mediastinal contours. IMPRESSION: 1. Intraoperative image with esophageal endoscope, ETT, and new right IJ central line. 2. Stable left chest tubes and small left pneumothorax. Left hilar intrabronchial occluded devices. 3. 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 Electronically Signed   By: Odessa Fleming M.D.   On: 12/19/2018 17:17   Dg Chest 2 View  Result Date: 12/23/2018 CLINICAL DATA:  Shortness of breath EXAM: CHEST - 2 VIEW COMPARISON:  Yesterday FINDINGS: Two left-sided catheters in essentially stable position although the upper catheter. Small left apical pneumothorax without significant change, 2 rib interspaces in height. Trace  pleural fluid. Stable chest wall emphysema. COPD. Endobronchial valves on the left. Normal heart size. IMPRESSION: 1. Unchanged small  left apical pneumothorax. 2. Small pleural effusions. Electronically Signed   By: Marnee Spring M.D.   On: 12/06/2018 09:47   Dg Abd 1 View  Result Date: 12/27/2018 CLINICAL DATA:  NG tube position. EXAM: ABDOMEN - 1 VIEW COMPARISON:  01/24/19 FINDINGS: NG tube tip overlies the mid stomach. Proximal port of the NG tube is below the GE junction. Volume loss left hemithorax with diffuse left-sided airspace disease again noted. IMPRESSION: NG tube tip is in the mid stomach. Electronically Signed   By: Kennith Center M.D.   On: 12/27/2018 22:32   Dg Abd 1 View  Result Date: 12/19/2018 CLINICAL DATA:  Orogastric tube placement. EXAM: ABDOMEN - 1 VIEW COMPARISON:  Yesterday. FINDINGS: Orogastric tube tip in the distal stomach and side hole in the mid stomach. Normal bowel gas pattern. Decreased left basilar airspace opacity and stable mild right basilar airspace opacity. Mild levoconvex thoracolumbar scoliosis and degenerative changes. IMPRESSION: 1. Orogastric tube tip in the distal stomach and side hole in the mid stomach. 2. Decreased left basilar probable pneumonia. 3. Stable mild right basilar probable pneumonia. Electronically Signed   By: Beckie Salts M.D.   On: 12/19/2018 21:18   Ct Chest Wo Contrast  Addendum Date: 12/11/2018   ADDENDUM REPORT: 12/11/2018 00:29 ADDENDUM: These results were called by telephone at the time of interpretation on 12/11/2018 at 12:28 am to Nurse Reuel Boom , who verbally acknowledged these results. Electronically Signed   By: Tollie Eth M.D.   On: 12/11/2018 00:29   Result Date: 12/11/2018 CLINICAL DATA:  Acute respiratory illness with pneumothorax. Patient is on suction. EXAM: CT CHEST WITHOUT CONTRAST TECHNIQUE: Multidetector CT imaging of the chest was performed following the standard protocol without IV contrast. COMPARISON:  CXR 12/10/2018 at 0703 hours. FINDINGS: Cardiovascular: Conventional branch pattern of the great vessels with atherosclerotic origins. Aortic  atherosclerosis is noted with ectasia of the ascending thoracic aorta to 3.3 cm in diameter. The unenhanced pulmonary arteries are unremarkable. There is coronary arteriosclerosis more notably along the left main, LAD and RCA. The heart size is normal. There is trace anterior pericardial effusion. Mediastinum/Nodes: Trace pneumomediastinum likely extension from the soft tissue emphysema along the chest wall and base of neck. No adenopathy. Midline patent trachea without deviation. Patent bilateral mainstem bronchi. Lungs/Pleura: Sizable left upper anterior pneumothorax not appreciated on the frontal view of the chest measuring at least 7.6 cm in thickness AP, series 3/60. Collapse of the left upper lobe is identified enveloping the left-sided chest tube which is seen coursing along the left major fissure. Repositioning of the chest is recommended more anteriorly within the left upper thorax. Otherwise, there is extensive centrilobular and paraseptal emphysema bilaterally. Trace right pleural effusion. Smaller pigtail catheter is noted at the left base overlying costophrenic angle. Upper Abdomen: Gallstones are identified of the partially included gallbladder. 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 The unenhanced liver, kidneys and adrenal glands are nonacute. Musculoskeletal: Extensive soft tissue emphysema is noted about the left hemithorax extending across midline into the right upper thorax, both shoulders and base of neck. Extension into the superior mediastinum is noted albeit minimal. IMPRESSION: 1. There is a large anterior left upper pneumothorax measuring up to 7.6 cm in thickness and repositioning of the patient's left-sided chest tube is recommended more so anteriorly. Partial left upper lobe collapse and atelectasis is  noted. 2. Extensive subcutaneous soft tissue emphysema along the left lateral chest wall, about base of neck and both shoulders extending  into the superior mediastinum. 3. No mediastinal shift. 4. 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 5. Uncomplicated cholelithiasis. 6. 9 mm splenic artery aneurysm, partially calcified. Aortic Atherosclerosis (ICD10-I70.0). Electronically Signed: By: Tollie Eth M.D. On: 12/11/2018 00:05   Dg Chest Port 1 View  Result Date: 12/12/2018 CLINICAL DATA:  Check endotracheal tube placement EXAM: PORTABLE CHEST 1 VIEW COMPARISON:  12/28/2018 FINDINGS: Cardiac shadow is stable. Three chest tubes are again noted on the left. Multiple endobronchial valves are seen. Endotracheal tube and left jugular central line are noted. Gastric catheter extends into the stomach. Right lung remains hyperinflated with stable basilar opacity. Diffuse increased density is noted throughout the left lung stable from the prior exam. Postsurgical changes in the apex are again seen. Small left apical pneumothorax is noted. IMPRESSION: Small left apical pneumothorax better visualized on the current exam. Tubes and lines as described above. Stable opacities throughout the left lung with right basilar opacity. Electronically Signed   By: Alcide Clever M.D.   On: 12/23/2018 07:10   Dg Chest Port 1 View  Result Date: 12/28/2018 CLINICAL DATA:  Respiratory failure, chest tube placement. EXAM: PORTABLE CHEST 1 VIEW COMPARISON:  Radiographs of December 27, 2018. FINDINGS: Stable cardiomediastinal silhouette. Endotracheal and nasogastric tubes are unchanged in position. Left internal jugular catheter is unchanged in position. Two left-sided chest tubes are noted as well as pigtail drainage catheter which are stable in position. No pneumothorax is noted. Emphysematous disease is noted in the right lung. Stable diffuse left lung opacity is noted concerning for pneumonia. Right basilar opacity is noted. Bony thorax is unremarkable. IMPRESSION: Stable support apparatus. Stable position of left-sided chest tubes. No definite pneumothorax is noted. Stable  diffuse left lung opacity is noted as well as right basilar opacity. Electronically Signed   By: Lupita Raider, M.D.   On: 12/28/2018 07:01   Dg Chest Port 1 View  Result Date: 12/27/2018 CLINICAL DATA:  78 year old male with history of intubation. EXAM: PORTABLE CHEST 1 VIEW COMPARISON:  Multiple priors, most recently 12/26/2018. FINDINGS: An endotracheal tube is in place with tip 3.9 cm above the carina. There is a left-sided internal jugular central venous catheter with tip terminating in the distal superior vena cava. A nasogastric tube is seen extending into the stomach, however, the tip of the nasogastric tube extends below the lower margin of the image. Two large-bore chest tubes remain in position with tips near the apex of the left hemithorax. There is also a small bore pigtail drainage catheter with tip projecting over the upper left hemithorax. Several artificial bronchial valves project over the left mid and upper lung. Small left pleural effusion. No appreciable left pneumothorax. Widespread airspace consolidation throughout the left lung, compatible with pneumonia. Right lung is essentially clear. Advanced emphysematous changes are noted. No evidence of pulmonary edema. Heart size is normal. Mediastinal contours are distorted. IMPRESSION: 1. Support apparatus, as above. 2. Extensive airspace consolidation throughout the left lung most compatible with pneumonia. This has significantly worsened over several prior examinations. 3. Advanced emphysema. Electronically Signed   By: Trudie Reed M.D.   On: 12/27/2018 07:04   Dg Chest Port 1 View  Result Date: 12/26/2018 CLINICAL DATA:  Respiratory failure. EXAM: PORTABLE CHEST 1 VIEW COMPARISON:  12/28/2018 FINDINGS: Nasogastric tube courses into the region of the stomach as tip is not visualized. Endotracheal tube, left I would  J central venous catheter and 3 left-sided chest tubes unchanged. Stable volume loss with hazy opacification throughout  the left lung. Surgical sutures over the left mid and upper thorax. Hazy opacification over the right base unchanged. Remainder of the exam is unchanged. IMPRESSION: Stable postsurgical change with volume loss of the left lung with stable hazy opacification throughout the left lung. Stable hazy opacification over the right base. Tubes and lines as described. Electronically Signed   By: Elberta Fortis M.D.   On: 12/26/2018 08:40   Dg Chest Port 1 View  Result Date: 12/16/2018 CLINICAL DATA:  Spontaneous pneumothorax EXAM: PORTABLE CHEST 1 VIEW COMPARISON:  12/24/2018 FINDINGS: Endotracheal tube is unchanged, approximately 0.8 centimeters above the carina. Nasogastric tube is identified, tip overlying the level of the stomach. Interval placement of 2 LEFT chest tubes. Pigtail catheter remains in place. There are new sutures in the LEFT lung apex. Suspect this small LEFT apical pneumothorax. There is increased opacity throughout the LEFT lung. RIGHT lung is hyperexpanded with focal opacity at the base. IMPRESSION: 1. Interval placement of 2 LEFT chest tubes. 2. Suspect small LEFT apical pneumothorax. 3. Increased opacity throughout the LEFT lung. Electronically Signed   By: Norva Pavlov M.D.   On: 12/08/2018 17:09   Dg Chest Port 1 View  Result Date: 12/24/2018 CLINICAL DATA:  Follow-up pneumothorax EXAM: PORTABLE CHEST 1 VIEW COMPARISON:  12/23/2018 FINDINGS: Cardiac shadow is stable. Endotracheal tube and gastric catheter are again seen and stable as is a left jugular central line. Left pigtail chest tube is noted. No significant pneumothorax is noted at this time. Diffuse infiltrate is again seen throughout the left lung stable from the previous exam. No new focal infiltrate is seen. Diffuse emphysematous changes are noted. Multiple bronchial bowels are noted on the left. IMPRESSION: Tubes and lines as described above. No sizable pneumothorax is seen. Stable left-sided infiltrate. Electronically Signed    By: Alcide Clever M.D.   On: 12/24/2018 12:05   Dg Chest Port 1 View  Result Date: 12/23/2018 CLINICAL DATA:  Central line placement EXAM: PORTABLE CHEST 1 VIEW COMPARISON:  12/23/2018 FINDINGS: Left jugular central venous catheter has been placed. The tip is difficult to visualize due to overlying EKG wire but appears to be in the SVC. Endotracheal tube remains in good position. NG tube enters the stomach with the tip not visualized. Pigtail chest tube extending left apex unchanged. Small left apical pneumothorax slightly larger compared with earlier today. Extensive infiltrate in the left lung base unchanged. COPD with hyperinflation of the right upper lobe. Mild right lower lobe atelectasis/infiltrate appears improved. IMPRESSION: Left jugular central venous catheter has been placed with the tip probably in the SVC. Catheter not well seen due to overlying EKG wire. Small left apical pneumothorax slightly larger compared with earlier today Extensive infiltrate in the left lung is unchanged. Right lower lobe atelectasis/infiltrate with mild improvement. Electronically Signed   By: Marlan Palau M.D.   On: 12/23/2018 12:27   Dg Chest Port 1 View  Result Date: 12/23/2018 CLINICAL DATA:  Hypoxia EXAM: PORTABLE CHEST 1 VIEW COMPARISON:  December 22, 2018. FINDINGS: Endotracheal tube tip is 3.8 cm above the carina. Pigtail catheter on the left is stable. Nasogastric tube tip and side port are below the diaphragm. Minimal left apical pneumothorax persists. There is subcutaneous air on the left, stable. There remains airspace opacity throughout much of the left lung with patchy infiltrate in the right base, stable. No new opacity evident. Heart size and  pulmonary vascular normal. No adenopathy. There remain endobronchial valves on the left, stable in position IMPRESSION: Tube and catheter positions as described with trace left pneumothorax. Airspace opacity bilaterally, considerably more on the left than the right,  stable. Stable cardiac silhouette. Subcutaneous air remains on the left. Electronically Signed   By: Bretta Bang III M.D.   On: 12/23/2018 07:35   Dg Chest Port 1 View  Result Date: 12/22/2018 CLINICAL DATA:  Chest tube EXAM: PORTABLE CHEST 1 VIEW COMPARISON:  Yesterday FINDINGS: Left apical chest tube. Unchanged small left apical pneumothorax. Decreasing left chest wall emphysema. History of pneumonia with left more than right airspace disease. Emphysema. Endobronchial valves on the left. Endotracheal and orogastric tubes remain in good position. IMPRESSION: 1. Unchanged small left apical pneumothorax. Decreasing chest wall emphysema. 2. Pneumonia with unchanged lung opacity. Electronically Signed   By: Marnee Spring M.D.   On: 12/22/2018 08:56   Dg Chest Port 1 View  Result Date: 12/21/2018 CLINICAL DATA:  Spontaneous pneumothorax EXAM: PORTABLE CHEST 1 VIEW COMPARISON:  Yesterday FINDINGS: Endotracheal tube tip between the clavicular heads and carina. The orogastric tube at least reaches the stomach. Continued shortening of right IJ line with tip near the IJ subclavian confluence. Left-sided chest tube in stable position. Increasing chest wall emphysema. Endobronchial valves on the left. Small left apical pneumothorax. Interstitial and airspace opacity throughout the left lung and at the right base. IMPRESSION: 1. Shorter right IJ catheter with tip near the IJ subclavian confluence. 2. Increased left chest wall emphysema. Unchanged small left apical pneumothorax. 3. History of pneumonia with stable airspace disease. Electronically Signed   By: Marnee Spring M.D.   On: 12/21/2018 07:17   Dg Chest Port 1 View  Result Date: 12/20/2018 CLINICAL DATA:  Chest tube removal EXAM: PORTABLE CHEST 1 VIEW COMPARISON:  Chest radiograph 12/20/2018 FINDINGS: ET tube terminates in the mid trachea. Enteric tube courses inferior to the diaphragm. Right IJ sheath projects over the right lung apex. Left chest  tube similar position. Interval removal chest tube in the left lung base. Similar-appearing left-greater-than-right mid lower lung patchy consolidative opacities. Small left pleural effusion. No definite pneumothorax. IMPRESSION: Interval removal 1 of the 2 left chest tubes. No definite left-sided pneumothorax. ET tube mid trachea. Electronically Signed   By: Annia Belt M.D.   On: 12/20/2018 11:29   Dg Chest Port 1 View  Result Date: 12/20/2018 CLINICAL DATA:  Follow-up pneumothorax EXAM: PORTABLE CHEST 1 VIEW COMPARISON:  12/19/2018 and prior exams FINDINGS: Endotracheal tube, NG tube, RIGHT IJ central venous catheter and LEFT thoracostomy tubes are again noted. LEFT intrabronchial pulmonary valves again noted. Probable small LEFT pneumothorax appears unchanged. Bibasilar opacities/atelectasis again noted. IMPRESSION: Unchanged appearance of the chest. Unchanged small probable LEFT pneumothorax. Electronically Signed   By: Harmon Pier M.D.   On: 12/20/2018 09:42   Dg Chest Port 1 View  Result Date: 12/19/2018 CLINICAL DATA:  Ventilator support.  Respiratory failure. EXAM: PORTABLE CHEST 1 VIEW COMPARISON:  12/18/2018 FINDINGS: Endotracheal tube tip is 3 cm above the carina. Nasogastric tube enters the stomach. Right internal jugular central line tip is in the SVC in the upper portion. Left chest tubes remain in place. Tiny amount of pleural air at the left apex appears similar. Persistent atelectasis in the lower lobes persists, left worse than right. No dense consolidation. IMPRESSION: Lines and tubes appear unchanged. Small amount of air persists at the left pleural apex. Lower lobe infiltrates left more than right appear similar.  Electronically Signed   By: Paulina FusiMark  Shogry M.D.   On: 12/19/2018 07:01   Portable Chest X-ray  Result Date: 12/18/2018 CLINICAL DATA:  Intubated patient. EXAM: PORTABLE CHEST 1 VIEW COMPARISON:  12/18/2018. FINDINGS: 1236 hours. Two views obtained. Interval re-intubation.  Endotracheal tube tip is in the mid trachea. Right IJ central venous catheter projects to the level of the upper SVC. Two small pigtail catheters in the left hemithorax are stable. There is a stable 5% left apical pneumothorax. Left chest wall soft tissue emphysema appears stable. Left basilar airspace disease has mildly progressed. Right basilar airspace opacity is unchanged. Left sided endobronchial valves are again noted. The heart size is stable. IMPRESSION: 1. Interval re-intubation. Endotracheal tube and additional support system components are well positioned. 2. No change in small left apical pneumothorax and soft tissue emphysema. 3. Slight worsening of left basilar airspace disease. Electronically Signed   By: Carey BullocksWilliam  Veazey M.D.   On: 12/18/2018 12:58   Dg Chest Port 1 View  Result Date: 12/18/2018 CLINICAL DATA:  Pneumothorax, chest tube EXAM: PORTABLE CHEST 1 VIEW COMPARISON:  12/18/2018 FINDINGS: Pigtail chest tube in the left apex unchanged. Small left apical pneumothorax slightly larger. Left basilar pigtail chest tube also in place. Endobronchial valves noted in the left upper lobe unchanged in position. Right jugular central venous catheter tip in the proximal SVC unchanged. COPD.  Progression of bibasilar airspace disease IMPRESSION: Slight progression of left apical pneumothorax which remains small. Progression of bibasilar airspace disease, concerning for pneumonia Electronically Signed   By: Marlan Palauharles  Clark M.D.   On: 12/18/2018 08:46   Dg Chest Port 1 View  Result Date: 12/18/2018 CLINICAL DATA:  Shortness of breath EXAM: PORTABLE CHEST 1 VIEW COMPARISON:  12/27/2018 FINDINGS: Right central venous catheter with tip over the upper SVC region. No pneumothorax. Postoperative changes in the left hilum. Two left chest tubes present with tiny residual left apical pneumothorax. Subcutaneous emphysema along the left lateral chest wall. This is improving since previous study. Heart size and  pulmonary vascularity are normal. Emphysematous changes and scattered fibrosis in the lungs. Developing infiltration or atelectasis in the right lung base. IMPRESSION: 1. Appliances appear in satisfactory position. 2. Two left chest tubes with tiny residual left pneumothorax. Improving subcutaneous emphysema along the left lateral chest wall. 3. Developing infiltration or atelectasis in the right lung base. 4. Emphysematous changes and fibrosis in the lungs. Electronically Signed   By: Burman NievesWilliam  Stevens M.D.   On: 12/18/2018 01:37   Dg Chest Port 1 View  Result Date: 12/16/2018 CLINICAL DATA:  Spontaneous pneumothorax EXAM: PORTABLE CHEST 1 VIEW COMPARISON:  Yesterday FINDINGS: Left chest tube at the apex. Trace left apical pneumothorax. Chronic indistinct opacities at the bases most dense on the right. There are apical emphysematous markings. Endobronchial valves are present on the left. Normal heart size. IMPRESSION: 1. Stable small left apical pneumothorax. 2. Stable presumed atelectasis at the bases. Electronically Signed   By: Marnee SpringJonathon  Watts M.D.   On: 12/16/2018 08:48   Dg Chest Port 1 View  Result Date: 12/16/2018 CLINICAL DATA:  Pulling sensation in chest, shortness of breath EXAM: PORTABLE CHEST 1 VIEW COMPARISON:  12/16/2018, 6:25 a.m. FINDINGS: No change in AP portable radiograph with tiny left apical pneumothorax and unchanged position of pigtail chest tubes about the left apex and left base. Left-sided endobronchial valves noted. Subcutaneous emphysema about the left chest wall. IMPRESSION: No change in AP portable radiograph with tiny left apical pneumothorax and unchanged position of  pigtail chest tubes about the left apex and left base. Left-sided endobronchial valves noted. Subcutaneous emphysema about the left chest wall. Electronically Signed   By: Lauralyn Primes M.D.   On: 12/16/2018 08:44   Dg Chest Port 1 View  Result Date: 12/15/2018 CLINICAL DATA:  Follow-up chest tube EXAM:  PORTABLE CHEST 1 VIEW COMPARISON:  12/09/2018 FINDINGS: Two left-sided chest tubes are again identified and stable. Left apical pneumothorax is again seen and stable. The right lung remains clear. No bony abnormality is seen. Endobronchial valves are noted. No bony abnormality is seen. IMPRESSION: Chest tubes in place on the left. Endobronchial valves are again seen and stable. Tiny left apical pneumothorax is stable. Electronically Signed   By: Alcide Clever M.D.   On: 12/15/2018 07:19   Dg Chest Port 1 View  Result Date: 12/12/2018 CLINICAL DATA:  Air-leak EXAM: PORTABLE CHEST 1 VIEW COMPARISON:  12/25/2018, 12/13/2018, 12/12/2018, CT 12/10/2018 FINDINGS: Two left-sided chest tubes with inferior tube pigtail at the CP angle, superior chest tube tip at the left apex. No significant change in small residual left apical pneumothorax. Small right-sided pleural effusion and hazy edema or atelectasis at the bases. Similar amount of left chest wall and axillary subcutaneous emphysema. Stable cardiomediastinal silhouette. Emphysematous disease. Small linear and rectangular opacities project over the left hilar region. Uncertain if these are surgical or external to the patient. IMPRESSION: Overall no significant interval change in small residual left apical pneumothorax or left chest wall emphysema. Small right pleural effusion with hazy atelectasis, edema or infiltrate at the right base. Electronically Signed   By: Jasmine Pang M.D.   On: 12/13/2018 19:44   Dg Chest Port 1 View  Result Date: 12/17/2018 CLINICAL DATA:  Pneumothorax. EXAM: PORTABLE CHEST 1 VIEW COMPARISON:  12/13/2018 FINDINGS: Stable position of left-sided chest tubes. Persistent small left apical pneumothorax. Hyperinflation of the lungs and upper lobe predominant emphysema. Small right pleural effusion. Normal cardiac silhouette. Calcific atherosclerotic disease of the aorta. Osseous structures are without acute abnormality. Left-sided chest wall  emphysema. IMPRESSION: 1. Persistent small left apical pneumothorax. 2. Small right pleural effusion. Electronically Signed   By: Ted Mcalpine M.D.   On: 12/10/2018 08:52   Dg Chest Port 1 View  Result Date: 12/13/2018 CLINICAL DATA:  Follow-up pneumothorax EXAM: PORTABLE CHEST 1 VIEW COMPARISON:  12/12/2018 FINDINGS: Two chest tubes are again identified and stable. The stable small left apical pneumothorax is again seen. Lungs are hyperinflated. No new focal abnormality is noted. IMPRESSION: No change from the previous day. Electronically Signed   By: Alcide Clever M.D.   On: 12/13/2018 11:39   Dg Chest Port 1 View  Result Date: 12/13/2018 CLINICAL DATA:  Pneumothorax, COPD EXAM: PORTABLE CHEST 1 VIEW COMPARISON:  12/12/2018 FINDINGS: Two chest tubes are noted along the periphery of the left hemithorax 1 projecting up to the third posterior rib level and the second overlying the left costophrenic angle. No significant change in tiny left apical and peripheral pneumothorax superimposed upon emphysematous disease. Heart and mediastinal contours are stable without mediastinal shift. Aortic atherosclerosis is noted. Soft tissue emphysema along the periphery of the left hemithorax is unchanged. IMPRESSION: Stable appearance of the chest status post left-sided chest tube placement. Electronically Signed   By: Tollie Eth M.D.   On: 12/13/2018 00:41   Dg Chest Port 1 View  Result Date: 12/12/2018 CLINICAL DATA:  Follow-up pneumothorax EXAM: PORTABLE CHEST 1 VIEW COMPARISON:  12/11/2018 FINDINGS: Two drainage catheters are identified and stable.  Minimal residual pneumothorax is seen on the left. Subcutaneous emphysema is seen. Diffuse emphysematous changes are noted. No focal infiltrate or effusion is seen. IMPRESSION: Two chest tubes on the left with minimal pneumothorax. No significant recurrence is noted. Emphysematous changes stable from the previous exam. Electronically Signed   By: Alcide Clever M.D.    On: 12/12/2018 08:42   Dg Chest Port 1 View  Result Date: 12/11/2018 CLINICAL DATA:  Follow-up left pneumothorax. EXAM: PORTABLE CHEST 1 VIEW COMPARISON:  Yesterday. FINDINGS: A pigtail catheter at the left lateral lung base is unchanged. Interval 2nd, short-segment pigtail catheter with its tip at the left lung apex, replacing the previously demonstrated larger caliber chest tube. No pneumothorax is seen at the spine. No significant change in subcutaneous emphysema. Minimal patchy opacity at the left lateral lung base. Otherwise, clear lungs. Normal sized heart. Diffuse osteopenia. IMPRESSION: 1. No pneumothorax. 2. Minimal patchy atelectasis or pneumonia at the left lateral lung base. Electronically Signed   By: Beckie Salts M.D.   On: 12/11/2018 14:51   Portable Chest 1 View  Result Date: 12/10/2018 CLINICAL DATA:  Pneumothorax EXAM: PORTABLE CHEST 1 VIEW COMPARISON:  None. FINDINGS: Pigtail chest tube projects over the left lung base. Surgical chest tube projects over the left upper lung zone. 5% left apical pneumothorax persists. Subcutaneous emphysema is in the bilateral supraclavicular regions and over the left chest wall. Right lung is clear. Scattered volume loss in the left lung. Upper normal heart size. IMPRESSION: There are 2 left chest tubes and a less than 5% left apical pneumothorax. Electronically Signed   By: Jolaine Click M.D.   On: 12/10/2018 10:14   Dg Abd Portable 1v  Result Date: 12/20/2018 CLINICAL DATA:  OG tube placement. EXAM: PORTABLE ABDOMEN - 1 VIEW COMPARISON:  Single-view of the abdomen 12/19/2018. FINDINGS: OG tube tip and side-port in the stomach. IMPRESSION: As above. Electronically Signed   By: Drusilla Kanner M.D.   On: 12/26/2018 16:01   Dg Abd Portable 1v  Result Date: 12/18/2018 CLINICAL DATA:  78 year old male status post gastric tube placement EXAM: PORTABLE ABDOMEN - 1 VIEW COMPARISON:  Chest x-ray 12/18/2018 FINDINGS: A gastric tube is present. The tip  overlies the gastric antrum. Extensive left lower lobe interstitial and airspace opacities throughout the lung base. A pigtail thoracostomy tube is present in the periphery of the left lower lobe. The visualized bowel gas pattern is normal without evidence of obstruction. Rounded calcification in the left upper quadrant may represent a granuloma in the spleen or a splenic artery aneurysm. 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 IMPRESSION: 1. The tip of the nasogastric that the tip of the gastric tube projects over the gastric antrum. Electronically Signed   By: Malachy Moan M.D.   On: 12/18/2018 17:18    Microbiology No results found for this or any previous visit (from the past 240 hour(s)).  Lab Basic Metabolic Panel: No results for input(s): NA, K, CL, CO2, GLUCOSE, BUN, CREATININE, CALCIUM, MG, PHOS in the last 168 hours. Liver Function Tests: No results for input(s): AST, ALT, ALKPHOS, BILITOT, PROT, ALBUMIN in the last 168 hours. No results for input(s): LIPASE, AMYLASE in the last 168 hours. No results for input(s): AMMONIA in the last 168 hours. CBC: No results for input(s): WBC, NEUTROABS, HGB, HCT, MCV, PLT in the last 168 hours. Cardiac Enzymes: No results for input(s): CKTOTAL, CKMB, CKMBINDEX, TROPONINI in the last 168 hours. Sepsis Labs: No results for input(s):  PROCALCITON, WBC, LATICACIDVEN in the last 168 hours.  Procedures/Operations     Mohit Zirbes 01/06/2019, 5:03 PM

## 2020-12-20 IMAGING — DX ABDOMEN - 1 VIEW
1 series · 2 of 2 positions shown · non-contrast
Comparison: 12/25/2018

CLINICAL DATA: NG tube position.

EXAM:
ABDOMEN - 1 VIEW

[Series 1: abdomen · 0.14mm/px · 2 of 2 slices shown]
[im 1/2]
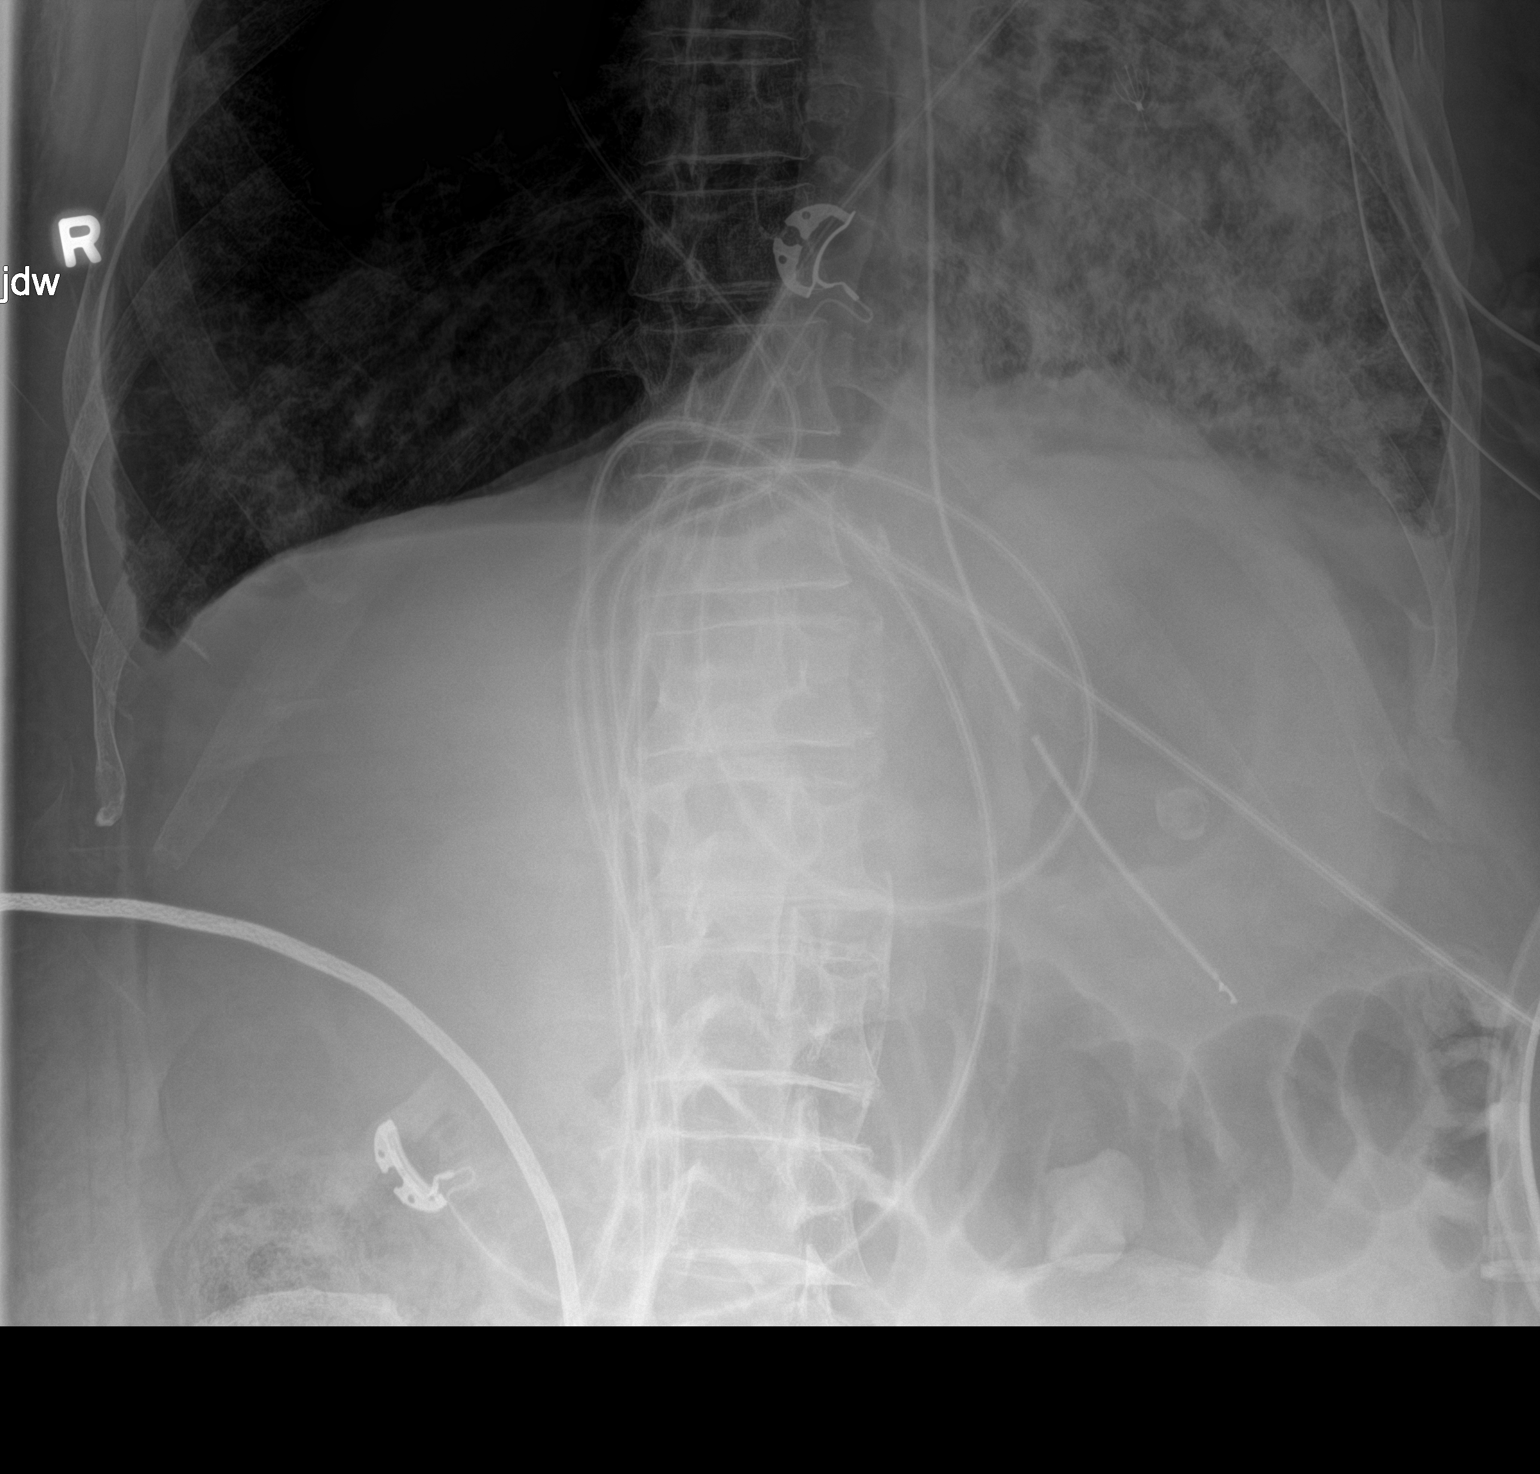
[im 2/2]
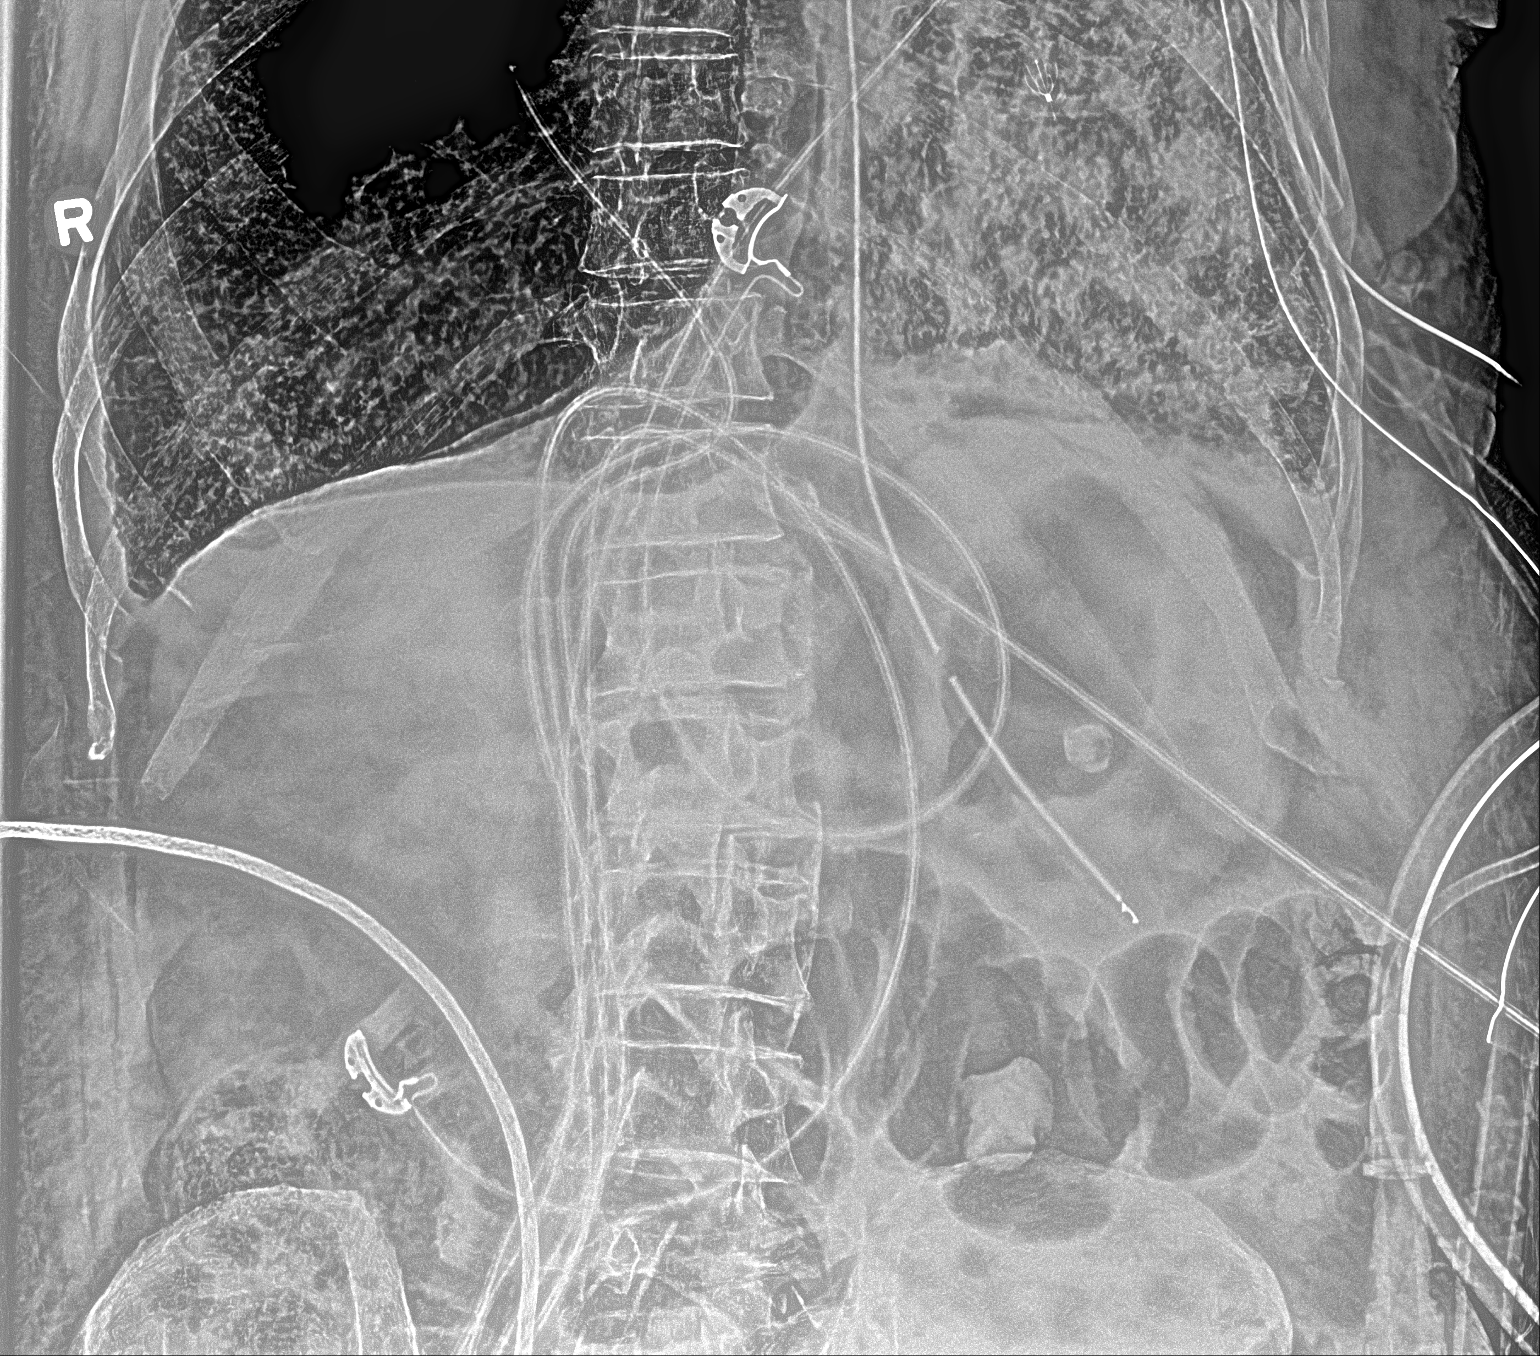

[2 of 2 positions shown; findings below may reference images not displayed]

FINDINGS: NG tube tip overlies the mid stomach. Proximal port of the NG tube
is below the GE junction. Volume loss left hemithorax with diffuse
left-sided airspace disease again noted.
IMPRESSION: NG tube tip is in the mid stomach.

## 2020-12-22 IMAGING — DX PORTABLE CHEST - 1 VIEW
1 series · 2 of 2 positions shown · non-contrast
Comparison: 12/28/2018

CLINICAL DATA: Check endotracheal tube placement

EXAM:
PORTABLE CHEST 1 VIEW

[Series 1: chest · 0.14mm/px · 2 of 2 slices shown]
[im 1/2]
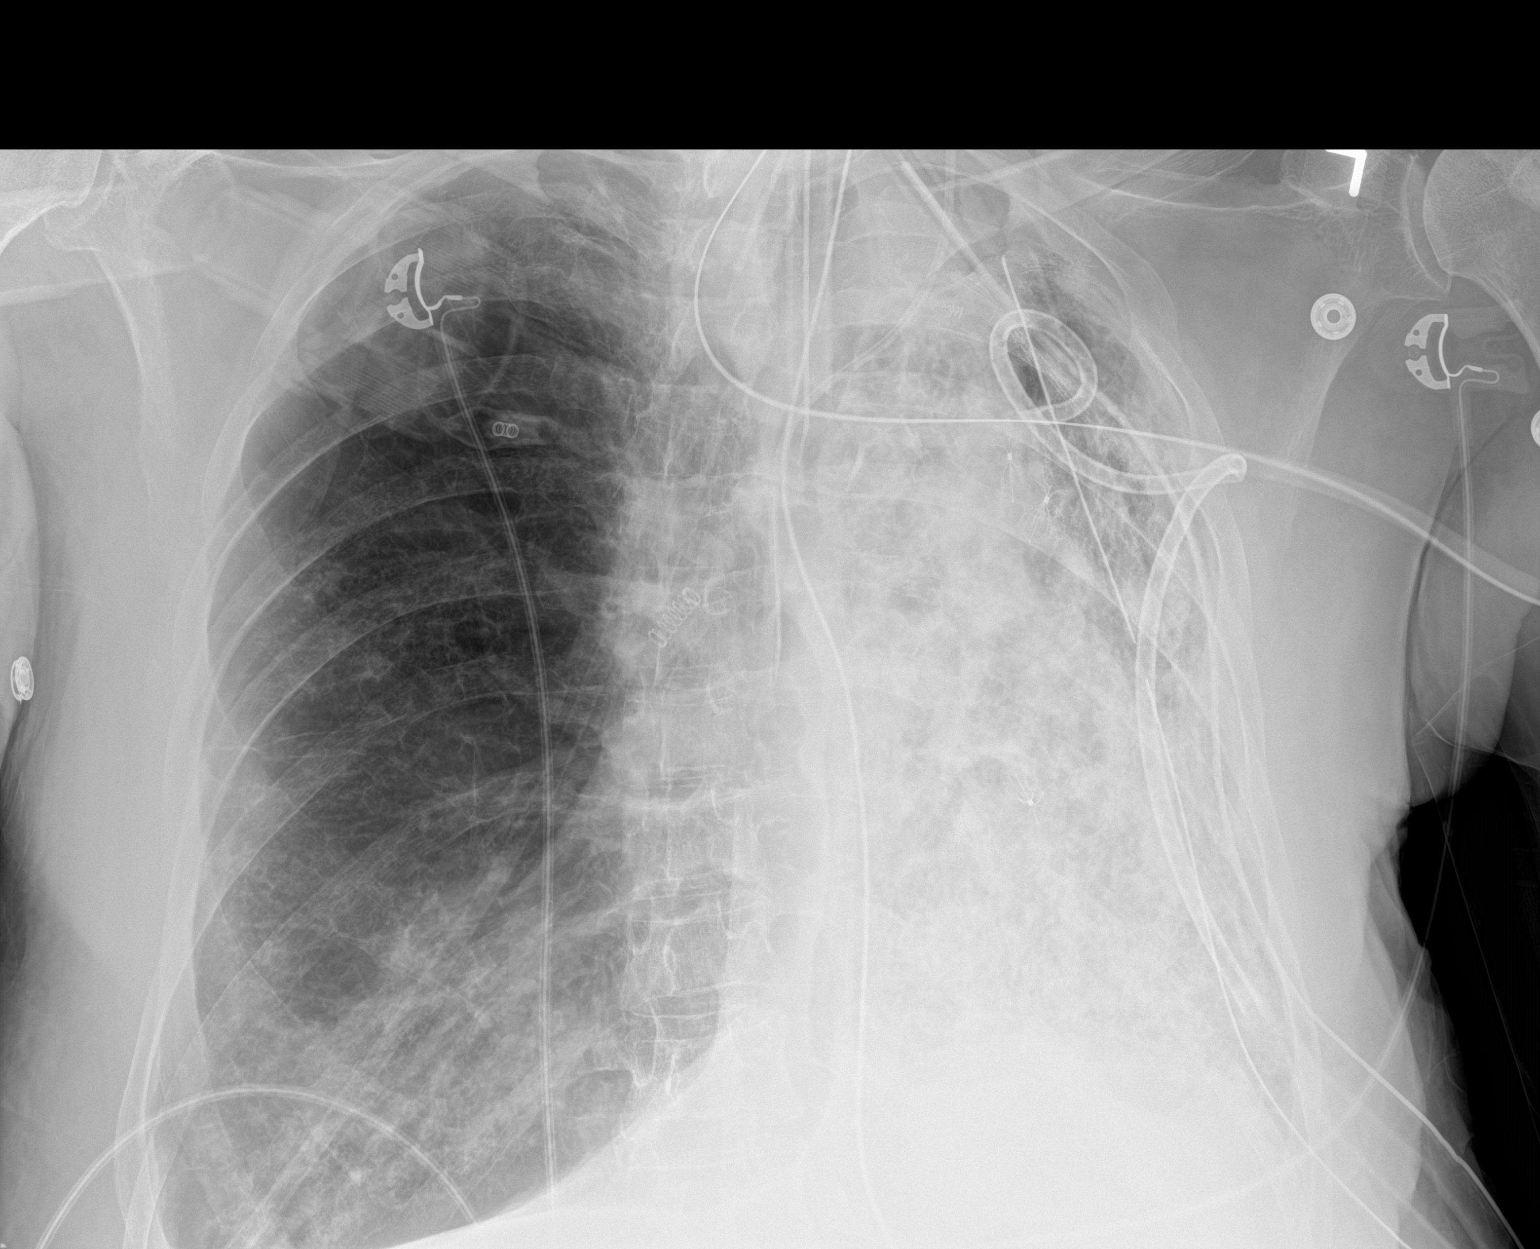
[im 2/2]
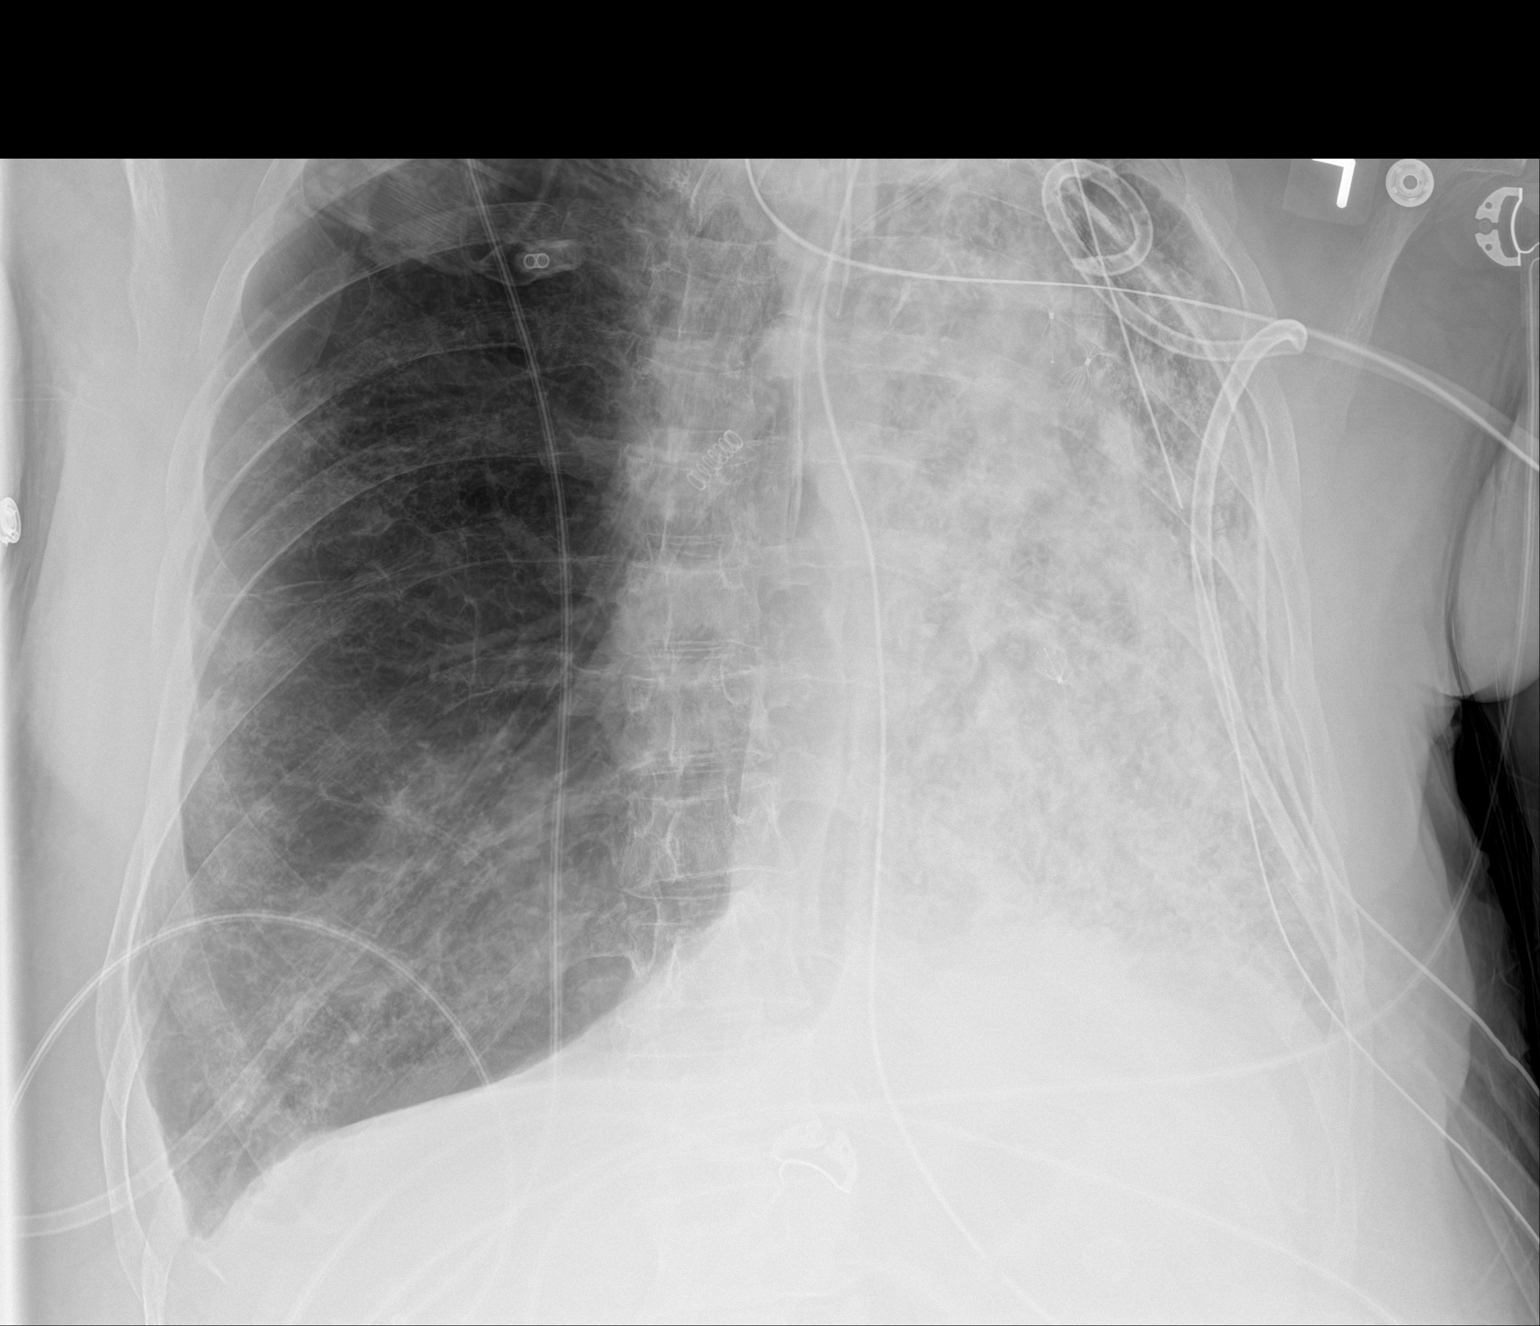

[2 of 2 positions shown; findings below may reference images not displayed]

FINDINGS: Cardiac shadow is stable. Three chest tubes are again noted on the
left. Multiple endobronchial valves are seen. Endotracheal tube and
left jugular central line are noted. Gastric catheter extends into
the stomach. Right lung remains hyperinflated with stable basilar
opacity. Diffuse increased density is noted throughout the left lung
stable from the prior exam. Postsurgical changes in the apex are
again seen. Small left apical pneumothorax is noted.
IMPRESSION: Small left apical pneumothorax better visualized on the current
exam.

Tubes and lines as described above.

Stable opacities throughout the left lung with right basilar
opacity.
